# Patient Record
Sex: Female | Born: 1940 | Race: White | Hispanic: No | Marital: Married | Smoking: Never smoker
Health system: Southern US, Community
[De-identification: ages and names within clinical notes are randomized; demographics above are authoritative.]

## PROBLEM LIST (undated history)

## (undated) DIAGNOSIS — T8859XA Other complications of anesthesia, initial encounter: Secondary | ICD-10-CM

## (undated) DIAGNOSIS — I340 Nonrheumatic mitral (valve) insufficiency: Secondary | ICD-10-CM

## (undated) DIAGNOSIS — M1990 Unspecified osteoarthritis, unspecified site: Secondary | ICD-10-CM

## (undated) DIAGNOSIS — G4730 Sleep apnea, unspecified: Secondary | ICD-10-CM

## (undated) DIAGNOSIS — E039 Hypothyroidism, unspecified: Secondary | ICD-10-CM

## (undated) DIAGNOSIS — R0600 Dyspnea, unspecified: Secondary | ICD-10-CM

## (undated) DIAGNOSIS — T4145XA Adverse effect of unspecified anesthetic, initial encounter: Secondary | ICD-10-CM

## (undated) DIAGNOSIS — Z9889 Other specified postprocedural states: Secondary | ICD-10-CM

## (undated) DIAGNOSIS — R112 Nausea with vomiting, unspecified: Secondary | ICD-10-CM

## (undated) DIAGNOSIS — N393 Stress incontinence (female) (male): Secondary | ICD-10-CM

## (undated) DIAGNOSIS — I10 Essential (primary) hypertension: Secondary | ICD-10-CM

## (undated) DIAGNOSIS — Z8489 Family history of other specified conditions: Secondary | ICD-10-CM

## (undated) HISTORY — PX: BREAST EXCISIONAL BIOPSY: SUR124

## (undated) HISTORY — PX: DILATION AND CURETTAGE OF UTERUS: SHX78

## (undated) HISTORY — PX: CATARACT EXTRACTION W/ INTRAOCULAR LENS  IMPLANT, BILATERAL: SHX1307

## (undated) HISTORY — PX: BREAST CYST ASPIRATION: SHX578

## (undated) HISTORY — PX: KNEE ARTHROSCOPY: SHX127

## (undated) HISTORY — PX: COLONOSCOPY: SHX174

---

## 2004-05-08 ENCOUNTER — Ambulatory Visit: Payer: Self-pay | Admitting: Internal Medicine

## 2004-10-21 ENCOUNTER — Other Ambulatory Visit: Payer: Self-pay

## 2004-10-31 ENCOUNTER — Ambulatory Visit: Payer: Self-pay | Admitting: Otolaryngology

## 2005-04-19 ENCOUNTER — Ambulatory Visit: Payer: Self-pay

## 2005-05-23 ENCOUNTER — Ambulatory Visit: Payer: Self-pay | Admitting: Internal Medicine

## 2005-05-27 ENCOUNTER — Ambulatory Visit: Payer: Self-pay | Admitting: General Practice

## 2005-10-07 ENCOUNTER — Ambulatory Visit: Payer: Self-pay | Admitting: Gastroenterology

## 2006-12-24 ENCOUNTER — Ambulatory Visit: Payer: Self-pay | Admitting: Unknown Physician Specialty

## 2007-09-08 ENCOUNTER — Ambulatory Visit: Payer: Self-pay | Admitting: Podiatry

## 2008-03-15 ENCOUNTER — Ambulatory Visit: Payer: Self-pay | Admitting: Unknown Physician Specialty

## 2008-03-23 ENCOUNTER — Ambulatory Visit: Payer: Self-pay | Admitting: Unknown Physician Specialty

## 2008-09-15 ENCOUNTER — Ambulatory Visit: Payer: Self-pay | Admitting: Surgery

## 2009-02-13 ENCOUNTER — Ambulatory Visit: Payer: Self-pay | Admitting: Unknown Physician Specialty

## 2009-03-16 ENCOUNTER — Ambulatory Visit: Payer: Self-pay | Admitting: Unknown Physician Specialty

## 2010-05-10 ENCOUNTER — Ambulatory Visit: Payer: Self-pay | Admitting: Unknown Physician Specialty

## 2010-05-28 ENCOUNTER — Ambulatory Visit: Payer: Self-pay | Admitting: Internal Medicine

## 2010-06-06 ENCOUNTER — Ambulatory Visit: Payer: Self-pay | Admitting: Internal Medicine

## 2012-01-20 ENCOUNTER — Ambulatory Visit: Payer: Self-pay | Admitting: Obstetrics & Gynecology

## 2013-01-24 ENCOUNTER — Ambulatory Visit: Payer: Self-pay | Admitting: Internal Medicine

## 2014-01-25 ENCOUNTER — Ambulatory Visit: Payer: Self-pay | Admitting: Internal Medicine

## 2014-01-30 DIAGNOSIS — G4733 Obstructive sleep apnea (adult) (pediatric): Secondary | ICD-10-CM | POA: Insufficient documentation

## 2014-01-30 DIAGNOSIS — Z9989 Dependence on other enabling machines and devices: Secondary | ICD-10-CM | POA: Insufficient documentation

## 2014-02-17 ENCOUNTER — Ambulatory Visit: Payer: Self-pay | Admitting: Unknown Physician Specialty

## 2014-03-28 ENCOUNTER — Ambulatory Visit: Payer: Self-pay | Admitting: Ophthalmology

## 2014-03-28 DIAGNOSIS — I10 Essential (primary) hypertension: Secondary | ICD-10-CM

## 2014-03-28 LAB — POTASSIUM: Potassium: 3.9 mmol/L (ref 3.5–5.1)

## 2014-04-04 ENCOUNTER — Ambulatory Visit: Payer: Self-pay | Admitting: Ophthalmology

## 2014-05-01 ENCOUNTER — Ambulatory Visit: Payer: Self-pay | Admitting: Ophthalmology

## 2014-05-30 ENCOUNTER — Ambulatory Visit: Payer: Self-pay | Admitting: Ophthalmology

## 2014-05-30 LAB — POTASSIUM: Potassium: 3.8 mmol/L

## 2014-06-06 ENCOUNTER — Ambulatory Visit
Admit: 2014-06-06 | Disposition: A | Discharge status: Home / Self Care | Payer: Self-pay | Attending: Ophthalmology | Admitting: Ophthalmology

## 2014-06-26 LAB — SURGICAL PATHOLOGY

## 2014-07-02 NOTE — Op Note (Signed)
PATIENT NAME:  Hannah Tucker, Hannah Tucker MR#:  [id number] DATE OF BIRTH:  1940/03/08  DATE OF PROCEDURE:  04/04/2014  PREOPERATIVE DIAGNOSIS:  Nuclear sclerotic cataract of the left eye.   POSTOPERATIVE DIAGNOSIS:  Nuclear sclerotic cataract of the left eye.   OPERATIVE PROCEDURE:  Cataract extraction by phacoemulsification with implant of intraocular lens to left eye.   SURGEON:  Galen ManilaWilliam Dymir Neeson, MD.   ANESTHESIA:  1. Managed anesthesia care.  2. Topical tetracaine drops followed by 2% Xylocaine jelly applied in the preoperative holding area.   COMPLICATIONS:  None.   TECHNIQUE:   Stop and chop.   DESCRIPTION OF PROCEDURE:  The patient was examined and consented in the preoperative holding area where the aforementioned topical anesthesia was applied to the left eye and then brought back to the Operating Room where the left eye was prepped and draped in the usual sterile ophthalmic fashion and a lid speculum was placed. A paracentesis was created with the side port blade and the anterior chamber was filled with viscoelastic. A near clear corneal incision was performed with the steel keratome. A continuous curvilinear capsulorrhexis was performed with a cystotome followed by the capsulorrhexis forceps. Hydrodissection and hydrodelineation were carried out with BSS on a blunt cannula. The lens was removed in a stop and chop technique and the remaining cortical material was removed with the irrigation-aspiration handpiece. The capsular bag was inflated with viscoelastic and the Tecnis ZCB00 22.0-diopter lens, serial number [id number], was placed in the capsular bag without complication. The remaining viscoelastic was removed from the eye with the irrigation-aspiration handpiece. The wounds were hydrated. The anterior chamber was flushed with Miostat and the eye was inflated to physiologic pressure. 0.1 mL of cefuroxime concentration 10 mg/mL was placed in the anterior chamber. The wounds were found to be  water tight. The eye was dressed with Vigamox. The patient was given protective glasses to wear throughout the day and a shield with which to sleep tonight. The patient was also given drops with which to begin a drop regimen today and will follow-up with me in one day.    ____________________________ Jerilee FieldWilliam L. Antoinne Spadaccini, MD wlp:bu D: 04/04/2014 13:10:00 ET T: 04/04/2014 13:44:26 ET JOB#: [id number]  cc: Janeya Deyo L. Kejuan Bekker, MD, <Dictator> Jerilee FieldWILLIAM L Kristyna Bradstreet MD ELECTRONICALLY SIGNED 04/05/2014 13:56

## 2014-07-02 NOTE — Op Note (Signed)
PATIENT NAME:  Hannah Tucker, Lititia J MR#:  [id number] DATE OF BIRTH:  March 18, 1940  DATE OF PROCEDURE:  06/06/2014  PREOPERATIVE DIAGNOSIS:  Nuclear sclerotic cataract of the right eye.   POSTOPERATIVE DIAGNOSIS:  Nuclear sclerotic cataract of the right eye.   OPERATIVE PROCEDURE:  Cataract extraction by phacoemulsification with implant of intraocular lens to right eye.   SURGEON:  Galen ManilaWilliam Brylan Dec, MD.   ANESTHESIA:  1. Managed anesthesia care.  2. Topical tetracaine drops followed by 2% Xylocaine jelly applied in the preoperative holding area.   COMPLICATIONS:  None.   TECHNIQUE:   Stop and chop.  DESCRIPTION OF PROCEDURE:  The patient was examined and consented in the preoperative holding area where the aforementioned topical anesthesia was applied to the right eye and then brought back to the Operating Room where the right eye was prepped and draped in the usual sterile ophthalmic fashion and a lid speculum was placed. A paracentesis was created with the side port blade and the anterior chamber was filled with viscoelastic. A near clear corneal incision was performed with the steel keratome. A continuous curvilinear capsulorrhexis was performed with a cystotome followed by the capsulorrhexis forceps. Hydrodissection and hydrodelineation were carried out with BSS on a blunt cannula. The lens was removed in a stop and chop technique and the remaining cortical material was removed with the irrigation-aspiration handpiece. The capsular bag was inflated with viscoelastic and the Tecnis ZCB00 21.5-diopter lens, serial number #[id number] was placed in the capsular bag without complication. The remaining viscoelastic was removed from the eye with the irrigation-aspiration handpiece. The wounds were hydrated. The anterior chamber was flushed with Miostat and the eye was inflated to physiologic pressure. 0.1 mL of cefuroxime concentration 10 mg/mL was placed in the anterior chamber. The wounds were found to be  water tight. The eye was dressed with Vigamox. The patient was given protective glasses to wear throughout the day and a shield with which to sleep tonight. The patient was also given drops with which to begin a drop regimen today and will follow-up with me in one day.     ____________________________ Jerilee FieldWilliam L. Uchenna Seufert, MD wlp:at D: 06/06/2014 12:34:02 ET T: 06/06/2014 13:10:20 ET JOB#: [id number]  cc: Letetia Romanello L. Abbeygail Igoe, MD, <Dictator> Jerilee FieldWILLIAM L Malky Rudzinski MD ELECTRONICALLY SIGNED 06/07/2014 12:34

## 2015-03-01 DIAGNOSIS — M818 Other osteoporosis without current pathological fracture: Secondary | ICD-10-CM | POA: Insufficient documentation

## 2015-03-14 ENCOUNTER — Other Ambulatory Visit: Payer: Self-pay | Admitting: Internal Medicine

## 2015-03-14 DIAGNOSIS — Z1231 Encounter for screening mammogram for malignant neoplasm of breast: Secondary | ICD-10-CM

## 2015-03-22 ENCOUNTER — Ambulatory Visit
Admission: RE | Admit: 2015-03-22 | Discharge: 2015-03-22 | Disposition: A | Discharge status: Home / Self Care | Payer: Medicare Other | Source: Ambulatory Visit | Attending: Internal Medicine | Admitting: Internal Medicine

## 2015-03-22 ENCOUNTER — Other Ambulatory Visit: Payer: Self-pay | Admitting: Internal Medicine

## 2015-03-22 DIAGNOSIS — Z1231 Encounter for screening mammogram for malignant neoplasm of breast: Secondary | ICD-10-CM | POA: Insufficient documentation

## 2015-06-13 ENCOUNTER — Other Ambulatory Visit: Payer: Self-pay | Admitting: Physician Assistant

## 2015-06-13 DIAGNOSIS — M2392 Unspecified internal derangement of left knee: Secondary | ICD-10-CM

## 2015-06-14 ENCOUNTER — Other Ambulatory Visit: Payer: Self-pay | Admitting: Orthopedic Surgery

## 2015-06-14 DIAGNOSIS — M25562 Pain in left knee: Secondary | ICD-10-CM

## 2015-06-14 DIAGNOSIS — M2392 Unspecified internal derangement of left knee: Secondary | ICD-10-CM

## 2015-07-04 ENCOUNTER — Ambulatory Visit
Admission: RE | Admit: 2015-07-04 | Discharge: 2015-07-04 | Disposition: A | Discharge status: Home / Self Care | Payer: Medicare Other | Source: Ambulatory Visit | Attending: Orthopedic Surgery | Admitting: Orthopedic Surgery

## 2015-07-04 DIAGNOSIS — M949 Disorder of cartilage, unspecified: Secondary | ICD-10-CM | POA: Insufficient documentation

## 2015-07-04 DIAGNOSIS — S83242A Other tear of medial meniscus, current injury, left knee, initial encounter: Secondary | ICD-10-CM | POA: Insufficient documentation

## 2015-07-04 DIAGNOSIS — M25562 Pain in left knee: Secondary | ICD-10-CM

## 2015-07-04 DIAGNOSIS — S83512A Sprain of anterior cruciate ligament of left knee, initial encounter: Secondary | ICD-10-CM | POA: Insufficient documentation

## 2015-07-04 DIAGNOSIS — M25662 Stiffness of left knee, not elsewhere classified: Secondary | ICD-10-CM | POA: Diagnosis present

## 2015-07-16 ENCOUNTER — Encounter
Admission: RE | Admit: 2015-07-16 | Discharge: 2015-07-16 | Disposition: A | Discharge status: Home / Self Care | Payer: Medicare Other | Source: Ambulatory Visit | Attending: Orthopedic Surgery | Admitting: Orthopedic Surgery

## 2015-07-16 DIAGNOSIS — Z8249 Family history of ischemic heart disease and other diseases of the circulatory system: Secondary | ICD-10-CM | POA: Diagnosis not present

## 2015-07-16 DIAGNOSIS — Z823 Family history of stroke: Secondary | ICD-10-CM | POA: Diagnosis not present

## 2015-07-16 DIAGNOSIS — Z86010 Personal history of colonic polyps: Secondary | ICD-10-CM | POA: Diagnosis not present

## 2015-07-16 DIAGNOSIS — Z833 Family history of diabetes mellitus: Secondary | ICD-10-CM | POA: Diagnosis not present

## 2015-07-16 DIAGNOSIS — M23252 Derangement of posterior horn of lateral meniscus due to old tear or injury, left knee: Secondary | ICD-10-CM | POA: Diagnosis not present

## 2015-07-16 DIAGNOSIS — M2392 Unspecified internal derangement of left knee: Secondary | ICD-10-CM | POA: Diagnosis present

## 2015-07-16 DIAGNOSIS — Z8261 Family history of arthritis: Secondary | ICD-10-CM | POA: Diagnosis not present

## 2015-07-16 DIAGNOSIS — M810 Age-related osteoporosis without current pathological fracture: Secondary | ICD-10-CM | POA: Diagnosis not present

## 2015-07-16 DIAGNOSIS — M94262 Chondromalacia, left knee: Secondary | ICD-10-CM | POA: Diagnosis not present

## 2015-07-16 DIAGNOSIS — G4733 Obstructive sleep apnea (adult) (pediatric): Secondary | ICD-10-CM | POA: Diagnosis not present

## 2015-07-16 DIAGNOSIS — E039 Hypothyroidism, unspecified: Secondary | ICD-10-CM | POA: Diagnosis not present

## 2015-07-16 DIAGNOSIS — Z881 Allergy status to other antibiotic agents status: Secondary | ICD-10-CM | POA: Diagnosis not present

## 2015-07-16 DIAGNOSIS — I10 Essential (primary) hypertension: Secondary | ICD-10-CM | POA: Diagnosis not present

## 2015-07-16 DIAGNOSIS — Z79899 Other long term (current) drug therapy: Secondary | ICD-10-CM | POA: Diagnosis not present

## 2015-07-16 DIAGNOSIS — M23222 Derangement of posterior horn of medial meniscus due to old tear or injury, left knee: Secondary | ICD-10-CM | POA: Diagnosis not present

## 2015-07-16 DIAGNOSIS — Z800 Family history of malignant neoplasm of digestive organs: Secondary | ICD-10-CM | POA: Diagnosis not present

## 2015-07-16 DIAGNOSIS — E785 Hyperlipidemia, unspecified: Secondary | ICD-10-CM | POA: Diagnosis not present

## 2015-07-16 DIAGNOSIS — I341 Nonrheumatic mitral (valve) prolapse: Secondary | ICD-10-CM | POA: Diagnosis not present

## 2015-07-16 DIAGNOSIS — M23242 Derangement of anterior horn of lateral meniscus due to old tear or injury, left knee: Secondary | ICD-10-CM | POA: Diagnosis not present

## 2015-07-16 HISTORY — DX: Essential (primary) hypertension: I10

## 2015-07-16 HISTORY — DX: Stress incontinence (female) (male): N39.3

## 2015-07-16 HISTORY — DX: Adverse effect of unspecified anesthetic, initial encounter: T41.45XA

## 2015-07-16 HISTORY — DX: Hypothyroidism, unspecified: E03.9

## 2015-07-16 HISTORY — DX: Unspecified osteoarthritis, unspecified site: M19.90

## 2015-07-16 HISTORY — DX: Other complications of anesthesia, initial encounter: T88.59XA

## 2015-07-16 LAB — POTASSIUM: POTASSIUM: 3.3 mmol/L — AB (ref 3.5–5.1)

## 2015-07-16 NOTE — Pre-Procedure Instructions (Signed)
[facility] and [facility]

## 2015-07-16 NOTE — Pre-Procedure Instructions (Signed)
"  Dr Ernest PineHooten in OR will place orders later today.  He is not requesting any labs."  Per Campbell Soupiffany

## 2015-07-16 NOTE — Patient Instructions (Signed)
  Your procedure is scheduled on: 07/18/15 Wed Report to Same Day Surgery 2nd floor medical mall To find out your arrival time please call [phone] between 1PM - 3PM on 07/17/15 Tues  Remember: Instructions that are not followed completely may result in serious medical risk, up to and including death, or upon the discretion of your surgeon and anesthesiologist your surgery may need to be rescheduled.    _x___ 1. Do not eat food or drink liquids after midnight. No gum chewing or hard candies.     ____ 2. No Alcohol for 24 hours before or after surgery.   ____ 3. Bring all medications with you on the day of surgery if instructed.    __x__ 4. Notify your doctor if there is any change in your medical condition     (cold, fever, infections).     Do not wear jewelry, make-up, hairpins, clips or nail polish.  Do not wear lotions, powders, or perfumes. You may wear deodorant.  Do not shave 48 hours prior to surgery. Men may shave face and neck.  Do not bring valuables to the hospital.    [facility] any belongings or valuables.               Contacts, dentures or bridgework may not be worn into surgery.  Leave your suitcase in the car. After surgery it may be brought to your room.  For patients admitted to the hospital, discharge time is determined by your treatment team.   Patients discharged the day of surgery will not be allowed to drive home.    Please read over the following fact sheets that you were given:   [facility] Pre[facility] and or MRSA Information   _x___ Take these medicines the morning of surgery with A SIP OF WATER:    1. levothyroxine (SYNTHROID, LEVOTHROID) 88 MCG tablet  2.  3.  4.  5.  6.  ____ Fleet Enema (as directed)   __x__ Use CHG Soap or sage wipes as directed on instruction sheet   ____ Use inhalers on the day of surgery and bring to hospital day of surgery  ____ Stop metformin 2 days prior to surgery    ____ Take  1/2 of usual insulin dose the night before surgery and none on the morning of           surgery.   ____ Stop aspirin or coumadin, or plavix  _x__ Stop Anti-inflammatories such as Advil, Aleve, Ibuprofen, Motrin, Naproxen,          Naprosyn, Goodies powders or aspirin products. Ok to take Tylenol.   ____ Stop supplements until after surgery.    ____ Bring C-Pap to the hospital.

## 2015-07-16 NOTE — Pre-Procedure Instructions (Signed)
Called for orders, left message.

## 2015-07-17 NOTE — Pre-Procedure Instructions (Signed)
KT 3.3 CALLED AND FAXED TO DR HOOTEN. ALSO REQUESTED ORDERS

## 2015-07-18 ENCOUNTER — Ambulatory Visit: Payer: Medicare Other | Admitting: Certified Registered"

## 2015-07-18 ENCOUNTER — Encounter: Payer: Self-pay | Admitting: Orthopedic Surgery

## 2015-07-18 ENCOUNTER — Ambulatory Visit
Admission: RE | Admit: 2015-07-18 | Discharge: 2015-07-18 | Disposition: A | Discharge status: Home / Self Care | Payer: Medicare Other | Source: Ambulatory Visit | Attending: Orthopedic Surgery | Admitting: Orthopedic Surgery

## 2015-07-18 ENCOUNTER — Encounter
Admission: RE | Disposition: A | Discharge status: Home / Self Care | Payer: Self-pay | Source: Ambulatory Visit | Attending: Orthopedic Surgery

## 2015-07-18 DIAGNOSIS — I10 Essential (primary) hypertension: Secondary | ICD-10-CM | POA: Insufficient documentation

## 2015-07-18 DIAGNOSIS — Z823 Family history of stroke: Secondary | ICD-10-CM | POA: Insufficient documentation

## 2015-07-18 DIAGNOSIS — M810 Age-related osteoporosis without current pathological fracture: Secondary | ICD-10-CM | POA: Insufficient documentation

## 2015-07-18 DIAGNOSIS — M94262 Chondromalacia, left knee: Secondary | ICD-10-CM | POA: Insufficient documentation

## 2015-07-18 DIAGNOSIS — Z800 Family history of malignant neoplasm of digestive organs: Secondary | ICD-10-CM | POA: Insufficient documentation

## 2015-07-18 DIAGNOSIS — Z833 Family history of diabetes mellitus: Secondary | ICD-10-CM | POA: Insufficient documentation

## 2015-07-18 DIAGNOSIS — M23222 Derangement of posterior horn of medial meniscus due to old tear or injury, left knee: Secondary | ICD-10-CM | POA: Insufficient documentation

## 2015-07-18 DIAGNOSIS — E785 Hyperlipidemia, unspecified: Secondary | ICD-10-CM | POA: Insufficient documentation

## 2015-07-18 DIAGNOSIS — Z79899 Other long term (current) drug therapy: Secondary | ICD-10-CM | POA: Insufficient documentation

## 2015-07-18 DIAGNOSIS — M23242 Derangement of anterior horn of lateral meniscus due to old tear or injury, left knee: Secondary | ICD-10-CM | POA: Insufficient documentation

## 2015-07-18 DIAGNOSIS — M23252 Derangement of posterior horn of lateral meniscus due to old tear or injury, left knee: Secondary | ICD-10-CM | POA: Insufficient documentation

## 2015-07-18 DIAGNOSIS — Z8249 Family history of ischemic heart disease and other diseases of the circulatory system: Secondary | ICD-10-CM | POA: Insufficient documentation

## 2015-07-18 DIAGNOSIS — Z86010 Personal history of colonic polyps: Secondary | ICD-10-CM | POA: Insufficient documentation

## 2015-07-18 DIAGNOSIS — E039 Hypothyroidism, unspecified: Secondary | ICD-10-CM | POA: Insufficient documentation

## 2015-07-18 DIAGNOSIS — I341 Nonrheumatic mitral (valve) prolapse: Secondary | ICD-10-CM | POA: Insufficient documentation

## 2015-07-18 DIAGNOSIS — G4733 Obstructive sleep apnea (adult) (pediatric): Secondary | ICD-10-CM | POA: Insufficient documentation

## 2015-07-18 DIAGNOSIS — Z8261 Family history of arthritis: Secondary | ICD-10-CM | POA: Insufficient documentation

## 2015-07-18 DIAGNOSIS — Z881 Allergy status to other antibiotic agents status: Secondary | ICD-10-CM | POA: Insufficient documentation

## 2015-07-18 HISTORY — PX: KNEE ARTHROSCOPY WITH LATERAL MENISECTOMY: SHX6193

## 2015-07-18 HISTORY — PX: KNEE ARTHROSCOPY: SHX127

## 2015-07-18 LAB — POCT I-STAT 4, (NA,K, GLUC, HGB,HCT)
Glucose, Bld: 87 mg/dL (ref 65–99)
HCT: 44 % (ref 36.0–46.0)
HEMOGLOBIN: 15 g/dL (ref 12.0–15.0)
Potassium: 3.5 mmol/L (ref 3.5–5.1)
Sodium: 141 mmol/L (ref 135–145)

## 2015-07-18 SURGERY — ARTHROSCOPY, KNEE
Anesthesia: General | Site: Knee | Laterality: Left | Wound class: Clean

## 2015-07-18 MED ORDER — METOCLOPRAMIDE HCL 5 MG/ML IJ SOLN
5.0000 mg | Freq: Once | INTRAMUSCULAR | Status: AC
  Administered 2015-07-18: 5 mg via INTRAVENOUS

## 2015-07-18 MED ORDER — MORPHINE SULFATE (PF) 4 MG/ML IV SOLN
INTRAVENOUS | Status: AC
  Filled 2015-07-18: qty 1

## 2015-07-18 MED ORDER — FENTANYL CITRATE (PF) 100 MCG/2ML IJ SOLN
25.0000 ug | INTRAMUSCULAR | Status: DC | PRN
  Administered 2015-07-18 (×3): 25 ug via INTRAVENOUS

## 2015-07-18 MED ORDER — MORPHINE SULFATE (PF) 4 MG/ML IV SOLN
INTRAVENOUS | Status: DC | PRN
  Administered 2015-07-18: 3 mg

## 2015-07-18 MED ORDER — ONDANSETRON HCL 4 MG/2ML IJ SOLN
INTRAMUSCULAR | Status: DC
Start: 2015-07-18 — End: 2015-07-19
  Filled 2015-07-18: qty 2

## 2015-07-18 MED ORDER — ONDANSETRON HCL 4 MG/2ML IJ SOLN
4.0000 mg | Freq: Once | INTRAMUSCULAR | Status: AC | PRN
  Administered 2015-07-18: 4 mg via INTRAVENOUS

## 2015-07-18 MED ORDER — BUPIVACAINE-EPINEPHRINE (PF) 0.25% -1:200000 IJ SOLN
INTRAMUSCULAR | Status: DC | PRN
  Administered 2015-07-18: 30 mL via PERINEURAL

## 2015-07-18 MED ORDER — FENTANYL CITRATE (PF) 100 MCG/2ML IJ SOLN
INTRAMUSCULAR | Status: DC | PRN
  Administered 2015-07-18: 25 ug via INTRAVENOUS
  Administered 2015-07-18 (×2): 50 ug via INTRAVENOUS
  Administered 2015-07-18: 25 ug via INTRAVENOUS

## 2015-07-18 MED ORDER — BUPIVACAINE-EPINEPHRINE (PF) 0.25% -1:200000 IJ SOLN
INTRAMUSCULAR | Status: AC
  Filled 2015-07-18: qty 30

## 2015-07-18 MED ORDER — SCOPOLAMINE 1 MG/3DAYS TD PT72
MEDICATED_PATCH | TRANSDERMAL | Status: AC
  Filled 2015-07-18: qty 1

## 2015-07-18 MED ORDER — FAMOTIDINE 20 MG PO TABS
20.0000 mg | ORAL_TABLET | Freq: Once | ORAL | Status: AC
  Administered 2015-07-18: 20 mg via ORAL

## 2015-07-18 MED ORDER — LACTATED RINGERS IV SOLN
INTRAVENOUS | Status: DC
  Administered 2015-07-18: 18:00:00 via INTRAVENOUS

## 2015-07-18 MED ORDER — FAMOTIDINE 20 MG PO TABS
ORAL_TABLET | ORAL | Status: AC
Start: 2015-07-18 — End: 2015-07-18
  Administered 2015-07-18: 20 mg
  Filled 2015-07-18: qty 1

## 2015-07-18 MED ORDER — ONDANSETRON HCL 4 MG/2ML IJ SOLN
INTRAMUSCULAR | Status: DC | PRN
  Administered 2015-07-18: 4 mg via INTRAVENOUS

## 2015-07-18 MED ORDER — GLYCOPYRROLATE 0.2 MG/ML IJ SOLN
INTRAMUSCULAR | Status: DC | PRN
  Administered 2015-07-18: 0.2 mg via INTRAVENOUS

## 2015-07-18 MED ORDER — LIDOCAINE HCL (PF) 2 % IJ SOLN
INTRAMUSCULAR | Status: DC | PRN
  Administered 2015-07-18: 50 mg

## 2015-07-18 MED ORDER — HYDROCODONE-ACETAMINOPHEN 5-325 MG PO TABS: 1.0000 | ORAL_TABLET | ORAL | Status: DC | PRN

## 2015-07-18 MED ORDER — PROPOFOL 10 MG/ML IV BOLUS
INTRAVENOUS | Status: DC | PRN
  Administered 2015-07-18: 120 mg via INTRAVENOUS

## 2015-07-18 MED ORDER — ACETAMINOPHEN 10 MG/ML IV SOLN
INTRAVENOUS | Status: AC
  Filled 2015-07-18: qty 100

## 2015-07-18 MED ORDER — ACETAMINOPHEN 10 MG/ML IV SOLN
INTRAVENOUS | Status: DC | PRN
  Administered 2015-07-18: 1000 mg via INTRAVENOUS

## 2015-07-18 MED ORDER — SCOPOLAMINE 1 MG/3DAYS TD PT72
1.0000 | MEDICATED_PATCH | TRANSDERMAL | Status: DC
  Administered 2015-07-18: 1.5 mg via TRANSDERMAL

## 2015-07-18 MED ORDER — METOCLOPRAMIDE HCL 5 MG/ML IJ SOLN
INTRAMUSCULAR | Status: AC
  Administered 2015-07-18: 5 mg via INTRAVENOUS
  Filled 2015-07-18: qty 2

## 2015-07-18 MED ORDER — FENTANYL CITRATE (PF) 100 MCG/2ML IJ SOLN
INTRAMUSCULAR | Status: AC
  Administered 2015-07-18: 25 ug via INTRAVENOUS
  Filled 2015-07-18: qty 2

## 2015-07-18 MED ORDER — MIDAZOLAM HCL 5 MG/5ML IJ SOLN
INTRAMUSCULAR | Status: DC | PRN
  Administered 2015-07-18: 1 mg via INTRAVENOUS

## 2015-07-18 MED ORDER — DEXAMETHASONE SODIUM PHOSPHATE 10 MG/ML IJ SOLN
INTRAMUSCULAR | Status: DC | PRN
  Administered 2015-07-18: 5 mg via INTRAVENOUS

## 2015-07-18 SURGICAL SUPPLY — 24 items
ADAPTER IRRIG TUBE 2 SPIKE SOL (ADAPTER) ×4 IMPLANT
BLADE SHAVER 4.5 DBL SERAT CV (CUTTER) ×2 IMPLANT
BNDG ESMARK 6X12 TAN STRL LF (GAUZE/BANDAGES/DRESSINGS) ×2 IMPLANT
CUFF TOURN 24 STER (MISCELLANEOUS) ×2 IMPLANT
CUFF TOURN 30 STER DUAL PORT (MISCELLANEOUS) ×2 IMPLANT
DRSG DERMACEA 8X12 NADH (GAUZE/BANDAGES/DRESSINGS) ×2 IMPLANT
DURAPREP 26ML APPLICATOR (WOUND CARE) ×4 IMPLANT
GAUZE SPONGE 4X4 12PLY STRL (GAUZE/BANDAGES/DRESSINGS) ×2 IMPLANT
GLOVE BIOGEL M STRL SZ7.5 (GLOVE) ×2 IMPLANT
GLOVE INDICATOR 8.0 STRL GRN (GLOVE) ×2 IMPLANT
GOWN STRL REUS W/ TWL LRG LVL3 (GOWN DISPOSABLE) ×2 IMPLANT
GOWN STRL REUS W/TWL LRG LVL3 (GOWN DISPOSABLE) ×2
IV LACTATED RINGER IRRG 3000ML (IV SOLUTION) ×8
IV LR IRRIG 3000ML ARTHROMATIC (IV SOLUTION) ×8 IMPLANT
KIT RM TURNOVER STRD PROC AR (KITS) ×2 IMPLANT
MANIFOLD NEPTUNE II (INSTRUMENTS) ×2 IMPLANT
PACK ARTHROSCOPY KNEE (MISCELLANEOUS) ×2 IMPLANT
SET TUBE SUCT SHAVER OUTFL 24K (TUBING) ×2 IMPLANT
SET TUBE TIP INTRA-ARTICULAR (MISCELLANEOUS) ×2 IMPLANT
SUT ETHILON 3-0 FS-10 30 BLK (SUTURE) ×2
SUTURE EHLN 3-0 FS-10 30 BLK (SUTURE) ×1 IMPLANT
TUBING ARTHRO INFLOW-ONLY STRL (TUBING) ×2 IMPLANT
WAND HAND CNTRL MULTIVAC 50 (MISCELLANEOUS) ×2 IMPLANT
WRAP KNEE W/COLD PACKS 25.5X14 (SOFTGOODS) ×2 IMPLANT

## 2015-07-18 NOTE — Anesthesia Procedure Notes (Signed)
Procedure Name: LMA Insertion Performed by: Teesha Ohm Pre-anesthesia Checklist: Patient identified, Patient being monitored, Timeout performed, Emergency Drugs available and Suction available Patient Re-evaluated:Patient Re-evaluated prior to inductionOxygen Delivery Method: Circle system utilized Preoxygenation: Pre-oxygenation with 100% oxygen Intubation Type: IV induction Ventilation: Mask ventilation without difficulty LMA: LMA inserted LMA Size: 3.5 Tube type: Oral Number of attempts: 1 Placement Confirmation: positive ETCO2 and breath sounds checked- equal and bilateral Tube secured with: Tape Dental Injury: Teeth and Oropharynx as per pre-operative assessment        

## 2015-07-18 NOTE — Brief Op Note (Signed)
07/18/2015  8:29 PM  PATIENT:  Hannah Tucker  75 y.o. female  PRE-OPERATIVE DIAGNOSIS:  INTERNAL DERRANGEMENT LEFT KNEE  POST-OPERATIVE DIAGNOSIS:  Tear of the Medial menicus, Grade 3 Chondromalacia medial compartmental, lateral menicus tear AND GRADE 4 CHONDROMALACIA LATERAL TIBIAL PLATEAU. 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   Delay start of Pharmacological VTE agent (>24hrs) due to surgical blood loss or risk of bleeding: not applicable

## 2015-07-18 NOTE — H&P (Signed)
The patient has been re-examined, and the chart reviewed, and there have been no interval changes to the documented history and physical.    The risks, benefits, and alternatives have been discussed at length. The patient expressed understanding of the risks benefits and agreed with plans for surgical intervention.  James P. Hooten, Jr. M.D.    

## 2015-07-18 NOTE — Transfer of Care (Signed)
Immediate Anesthesia Transfer of Care Note  Patient: Hannah Tucker  Procedure(s) Performed: Procedure(s): ARTHROSCOPY KNEE WITH MEDIAL COMPARTMENTAL MENICUS CHONDROPLASTY (Left) LEFT KNEE ARTHROSCOPY WITH PARTIAL LATERAL MENISECTOMY GRADE 4 CHONDROMYLASIA LATERAL TIBIAL PLATEAU (Left)  Patient Location: PACU  Anesthesia Type:General  Level of Consciousness: awake, alert , oriented and patient cooperative  Airway & Oxygen Therapy: Patient Spontanous Breathing and Patient connected to face mask oxygen  Post-op Assessment: Report given to RN and Post -op Vital signs reviewed and stable  Post vital signs: Reviewed and stable  Last Vitals:  Filed Vitals:   07/18/15 1710 07/18/15 2030  BP:  154/70  Pulse: 65 66  Temp: 36.6 C 36.2 C  Resp: 14 28    Last Pain: There were no vitals filed for this visit.       Complications: No apparent anesthesia complications

## 2015-07-18 NOTE — Op Note (Signed)
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   ASSISTANT: none  ANESTHESIA: general  ESTIMATED BLOOD LOSS: Minimal  FLUIDS REPLACED: 900 mL of crystalloid  TOURNIQUET TIME: Not used   DRAINS: none  IMPLANTS UTILIZED: None  INDICATIONS FOR SURGERY: Hannah Tucker is a 75 y.o. year old female who has been seen for complaints of left knee pain. MRI demonstrated findings consistent with meniscal pathology. After discussion of the risks and benefits of surgical intervention, the patient expressed understanding of the risks benefits and agree with plans for left knee arthroscopy.   PROCEDURE IN DETAIL: The patient was brought into the operating room and, after adequate general anesthesia was achieved, a tourniquet was applied to the left thigh and the leg was placed in the leg holder. All bony prominences were well padded. The patient's left knee was cleaned and prepped with alcohol and Duraprep and draped in the usual sterile fashion. A "timeout" was performed as per usual protocol. The anticipated portal sites were injected with 0.25% Marcaine with epinephrine. An anterolateral incision was made and a cannula was inserted. A small effusion was evacuated and the knee was distended with fluid using the pump. The scope was advanced down the medial gutter into the medial  compartment. Under visualization with the scope, an anteromedial portal was created and a hooked probe was inserted. The medial meniscus was visualized and probed. There was a degenerative tear involving the posterior horn of the medial meniscus. The tear was debrided using combination of meniscal punches and a 4.5 mm incisor shaver. Final contouring was performed using the 50 ArthroCare wand. The articular cartilage was visualized. Grade 3 chondromalacia was noted involving primarily the medial femoral condyle. These areas were debrided and contoured using the ArthroCare wand.  The scope was then advanced into the intercondylar notch. The anterior cruciate ligament was visualized and probed and felt to be intact. The scope was removed from the lateral portal and reinserted via the anteromedial portal to better visualize the lateral compartment. The lateral meniscus was visualized and probed. Degenerative, macerated tears of both the anterior and posterior horns of the lateral meniscus were noted. These areas were debrided using meniscal punches and a 4.5 mm shaver. Contouring was performed using the ArthroCare wand. The articular cartilage of the lateral compartment was visualized. Grade 4 chondromalacia was noted to the lateral tibial plateau. The area was debrided and contoured using the ArthroCare wand. Lesser changes are noted to the lateral femoral condyle. Finally, the scope was advanced so as to visualize the patellofemoral articulation. Good patellar tracking was appreciated. Grade 2-3 changes of chondromalacia were noted and were contoured using the ArthroCare wand.  The knee was irrigated with copius amounts of fluid and suctioned dry. The anterolateral portal was re-approximated with #3-0 nylon. A combination of 0.25% Marcaine with epinephrine and 4 mg of Morphine were injected via the scope. The scope  was removed and the anteromedial portal was re-approximated with #3-0 nylon. A sterile dressing  was applied followed by application of an ice wrap.  The patient tolerated the procedure well and was transported to the PACU in stable condition.  James P. Angie Fava., M.D.

## 2015-07-18 NOTE — Anesthesia Preprocedure Evaluation (Signed)
Anesthesia Evaluation  Patient identified by MRN, date of birth, ID band Patient awake    Reviewed: Allergy & Precautions, H&P , NPO status , Patient's Chart, lab work & pertinent test results, reviewed documented beta blocker date and time   History of Anesthesia Complications (+) PONV and history of anesthetic complications  Airway Mallampati: I  TM Distance: >3 FB Neck ROM: full    Dental no notable dental hx. (+) Teeth Intact, Caps   Pulmonary neg shortness of breath, sleep apnea and Continuous Positive Airway Pressure Ventilation , neg COPD, neg recent URI,    Pulmonary exam normal breath sounds clear to auscultation       Cardiovascular Exercise Tolerance: Good hypertension, On Medications (-) angina(-) CAD, (-) Past MI, (-) Cardiac Stents and (-) CABG Normal cardiovascular exam(-) dysrhythmias + Valvular Problems/Murmurs MVP  Rhythm:regular Rate:Normal     Neuro/Psych negative neurological ROS  negative psych ROS   GI/Hepatic negative GI ROS, Neg liver ROS,   Endo/Other  neg diabetesHypothyroidism   Renal/GU negative Renal ROS  negative genitourinary   Musculoskeletal   Abdominal   Peds  Hematology negative hematology ROS (+)   Anesthesia Other Findings Past Medical History:   Hypertension                                                 Hypothyroidism                                               Arthritis                                                    Stress incontinence                                          Complication of anesthesia                                     Comment:nausea   Reproductive/Obstetrics negative OB ROS                             Anesthesia Physical Anesthesia Plan  ASA: II  Anesthesia Plan: General   Post-op Pain Management:    Induction:   Airway Management Planned:   Additional Equipment:   Intra-op Plan:   Post-operative Plan:    Informed Consent: I have reviewed the patients History and Physical, chart, labs and discussed the procedure including the risks, benefits and alternatives for the proposed anesthesia with the patient or authorized representative who has indicated his/her understanding and acceptance.   Dental Advisory Given  Plan Discussed with: Anesthesiologist, CRNA and Surgeon  Anesthesia Plan Comments:         Anesthesia Quick Evaluation

## 2015-07-18 NOTE — Discharge Instructions (Signed)
°  Instructions after Knee Arthroscopy  ° ° James P. Hooten, Jr., M.D.    ° Dept. of Orthopaedics & Sports Medicine ° [facility] ° [address] ° [place], [place]  [address] ° ° Phone: [phone]   Fax: [phone] ° ° °DIET: °• Drink plenty of non-alcoholic fluids & begin a light diet. °• Resume your normal diet the day after surgery. ° °ACTIVITY:  °• You may use crutches or a walker with weight-bearing as tolerated, unless instructed otherwise. °• You may wean yourself off of the walker or crutches as tolerated.  °• Begin doing gentle exercises. Exercising will reduce the pain and swelling, increase motion, and prevent muscle weakness.   °• Avoid strenuous activities or athletics for a minimum of 4-6 weeks after arthroscopic surgery. °• Do not drive or operate any equipment until instructed. ° °WOUND CARE:  °• Place one to two pillows under the knee the first day or two when sitting or lying.  °• Continue to use the ice packs periodically to reduce pain and swelling. °• The small incisions in your knee are closed with nylon stitches. The stitches will be removed in the office. °• The bulky dressing may be removed on the second day after surgery. DO NOT TOUCH THE STITCHES. Put a Band-Aid over each stitch. Do NOT use any ointments or creams on the incisions.  °• You may bathe or shower after the stitches are removed at the first office visit following surgery. ° °MEDICATIONS: °• You may resume your regular medications. °• Please take the pain medication as prescribed. °• Do not take pain medication on an empty stomach. °• Do not drive or drink alcoholic beverages when taking pain medications. ° °CALL THE OFFICE FOR: °• Temperature above 101 degrees °• Excessive bleeding or drainage on the dressing. °• Excessive swelling, coldness, or paleness of the toes. °• Persistent nausea and vomiting. ° °FOLLOW-UP:  °You should have an appointment to return to the office in 7-10 days after surgery.  AMBULATORY  SURGERY  °DISCHARGE INSTRUCTIONS ° ° °The drugs that you were given will stay in your system until tomorrow so for the next 24 hours you should not: ° °Drive an automobile °Make any legal decisions °Drink any alcoholic beverage ° ° °You may resume regular meals tomorrow.  Today it is better to start with liquids and gradually work up to solid foods. ° °You may eat anything you prefer, but it is better to start with liquids, then soup and crackers, and gradually work up to solid foods. ° ° °Please notify your doctor immediately if you have any unusual bleeding, trouble breathing, redness and pain at the surgery site, drainage, fever, or pain not relieved by medication. ° ° ° °Additional Instructions: ° ° ° ° ° ° ° °• Please contact your physician with any problems or Same Day Surgery at [phone], Monday through Friday 6 am to 4 pm, or [place] [facility] [place] Main number at [phone]. °

## 2015-07-19 ENCOUNTER — Encounter: Payer: Self-pay | Admitting: Orthopedic Surgery

## 2015-07-19 NOTE — Anesthesia Postprocedure Evaluation (Signed)
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)  Patient location during evaluation: PACU Anesthesia Type: General Level of consciousness: awake and alert Pain management: pain level controlled Vital Signs Assessment: post-procedure vital signs reviewed and stable Respiratory status: spontaneous breathing, nonlabored ventilation, respiratory function stable and patient connected to nasal cannula oxygen Cardiovascular status: blood pressure returned to baseline and stable Postop Assessment: no signs of nausea or vomiting Anesthetic complications: no    Last Vitals:  Filed Vitals:   07/18/15 2141 07/18/15 2216  BP: 163/76 165/77  Pulse: 64 65  Temp:    Resp: 13 16    Last Pain:  Filed Vitals:   07/18/15 2217  PainSc: 2                  Lenard SimmerAndrew Rhyatt Muska

## 2016-04-10 ENCOUNTER — Other Ambulatory Visit: Payer: Self-pay | Admitting: Internal Medicine

## 2016-04-10 DIAGNOSIS — Z1231 Encounter for screening mammogram for malignant neoplasm of breast: Secondary | ICD-10-CM

## 2016-05-06 ENCOUNTER — Ambulatory Visit: Payer: Medicare Other

## 2016-06-02 ENCOUNTER — Ambulatory Visit
Admission: RE | Admit: 2016-06-02 | Discharge: 2016-06-02 | Disposition: A | Discharge status: Home / Self Care | Payer: Medicare Other | Source: Ambulatory Visit | Attending: Internal Medicine | Admitting: Internal Medicine

## 2016-06-02 DIAGNOSIS — Z1231 Encounter for screening mammogram for malignant neoplasm of breast: Secondary | ICD-10-CM

## 2016-08-13 ENCOUNTER — Other Ambulatory Visit: Payer: Medicare Other

## 2016-11-12 ENCOUNTER — Other Ambulatory Visit: Payer: Medicare Other

## 2016-11-17 ENCOUNTER — Encounter
Admission: RE | Admit: 2016-11-17 | Discharge: 2016-11-17 | Disposition: A | Discharge status: Home / Self Care | Payer: Medicare Other | Source: Ambulatory Visit | Attending: Orthopedic Surgery | Admitting: Orthopedic Surgery

## 2016-11-17 DIAGNOSIS — I10 Essential (primary) hypertension: Secondary | ICD-10-CM | POA: Diagnosis not present

## 2016-11-17 DIAGNOSIS — Z01810 Encounter for preprocedural cardiovascular examination: Secondary | ICD-10-CM | POA: Insufficient documentation

## 2016-11-17 DIAGNOSIS — Z01812 Encounter for preprocedural laboratory examination: Secondary | ICD-10-CM | POA: Diagnosis not present

## 2016-11-17 HISTORY — DX: Nausea with vomiting, unspecified: R11.2

## 2016-11-17 HISTORY — DX: Dyspnea, unspecified: R06.00

## 2016-11-17 HISTORY — DX: Family history of other specified conditions: Z84.89

## 2016-11-17 HISTORY — DX: Other specified postprocedural states: Z98.890

## 2016-11-17 LAB — URINALYSIS, ROUTINE W REFLEX MICROSCOPIC
BILIRUBIN URINE: NEGATIVE
GLUCOSE, UA: NEGATIVE mg/dL
HGB URINE DIPSTICK: NEGATIVE
Ketones, ur: NEGATIVE mg/dL
Leukocytes, UA: NEGATIVE
Nitrite: NEGATIVE
PH: 6 (ref 5.0–8.0)
Protein, ur: NEGATIVE mg/dL
SPECIFIC GRAVITY, URINE: 1.004 — AB (ref 1.005–1.030)

## 2016-11-17 LAB — TYPE AND SCREEN
ABO/RH(D): A NEG
ANTIBODY SCREEN: NEGATIVE

## 2016-11-17 LAB — COMPREHENSIVE METABOLIC PANEL
ALBUMIN: 4 g/dL (ref 3.5–5.0)
ALT: 15 U/L (ref 14–54)
ANION GAP: 8 (ref 5–15)
AST: 21 U/L (ref 15–41)
Alkaline Phosphatase: 37 U/L — ABNORMAL LOW (ref 38–126)
BILIRUBIN TOTAL: 0.4 mg/dL (ref 0.3–1.2)
BUN: 13 mg/dL (ref 6–20)
CO2: 28 mmol/L (ref 22–32)
Calcium: 9.6 mg/dL (ref 8.9–10.3)
Chloride: 103 mmol/L (ref 101–111)
Creatinine, Ser: 0.72 mg/dL (ref 0.44–1.00)
GFR calc non Af Amer: >60 mL/min (ref >60)
GLUCOSE: 96 mg/dL (ref 65–99)
POTASSIUM: 3.6 mmol/L (ref 3.5–5.1)
Sodium: 139 mmol/L (ref 135–145)
TOTAL PROTEIN: 7.6 g/dL (ref 6.5–8.1)

## 2016-11-17 LAB — CBC
HEMATOCRIT: 41.5 % (ref 35.0–47.0)
Hemoglobin: 14 g/dL (ref 12.0–16.0)
MCH: 27.6 pg (ref 26.0–34.0)
MCHC: 33.8 g/dL (ref 32.0–36.0)
MCV: 81.7 fL (ref 80.0–100.0)
Platelets: 265 10*3/uL (ref 150–440)
RBC: 5.08 MIL/uL (ref 3.80–5.20)
RDW: 14.3 % (ref 11.5–14.5)
WBC: 5.8 10*3/uL (ref 3.6–11.0)

## 2016-11-17 LAB — C-REACTIVE PROTEIN: CRP: <0.8 mg/dL (ref <1.0)

## 2016-11-17 LAB — PROTIME-INR
INR: 0.95
Prothrombin Time: 12.6 seconds (ref 11.4–15.2)

## 2016-11-17 LAB — APTT: APTT: 28 s (ref 24–36)

## 2016-11-17 LAB — SEDIMENTATION RATE: Sed Rate: 10 mm/hr (ref 0–30)

## 2016-11-17 LAB — SURGICAL PCR SCREEN
MRSA, PCR: NEGATIVE
Staphylococcus aureus: NEGATIVE

## 2016-11-17 NOTE — Patient Instructions (Signed)
  Your procedure is scheduled on: Mon. 11/24/16 Report to Day Surgery. To find out your arrival time please call [phone] between 1PM - 3PM on Friday .11/24/16  Remember: Instructions that are not followed completely may result in serious medical risk, up to and including death, or upon the discretion of your surgeon and anesthesiologist your surgery may need to be rescheduled.    ___x_ 1. Do not eat food after midnight the night before your procedure. No gum chewing or hard candies. You may drink clear liquids up to 2 hours before you are scheduled to arrive for your surgery- DO not drink clear liquids within 2 hours of the start of your surgery.  Clear Liquids include: water, apple juice without pulp, clear carbohydrate drink such as Clearfast of Gartorade, Black Coffee or Tea (Do not add anything to coffee or tea).    __x__ 2. No Alcohol for 24 hours before or after surgery.   ____ 3. Do Not Smoke For 24 Hours Prior to Your Surgery.   ____ 4. Bring all medications with you on the day of surgery if instructed.    _x___ 5. Notify your doctor if there is any change in your medical condition     (cold, fever, infections).       Do not wear jewelry, make-up, hairpins, clips or nail polish.  Do not wear lotions, powders, or perfumes. You may wear deodorant.  Do not shave 48 hours prior to surgery. Men may shave face and neck.  Do not bring valuables to the hospital.    [facility] is not responsible for any belongings or valuables.               Contacts, dentures or bridgework may not be worn into surgery.  Leave your suitcase in the car. After surgery it may be brought to your room.  For patients admitted to the hospital, discharge time is determined by your                treatment team.   Patients discharged the day of surgery will not be allowed to drive home.   Please read over the following fact sheets that you were given:   MRSA Information   _x___ Take  these medicines the morning of surgery with A SIP OF WATER:    1. levothyroxine (SYNTHROID, LEVOTHROID) 88 MCG tablet  2.   3.   4.  5.  6.  ____ Fleet Enema (as directed)   _x___ Use CHG Soap as directed  ____ Use inhalers on the day of surgery  ____ Stop metformin 2 days prior to surgery    ____ Take 1/2 of usual insulin dose the night before surgery and none on the morning of surgery.   ____ Stop Coumadin/Plavix/aspirin on   __x__ Stop Anti-inflammatories on naproxen sodium (ANAPROX) 220 MG tablet, Ibuprofen or Aspirin today   __x__ Stop supplements until after surgery.  Multiple Vitamins-Minerals (PRESERVISION AREDS 2+MULTI VIT PO), diclofenac sodium (VOLTAREN) 1 % GEL  __x__ Bring C-Pap to the hospital.

## 2016-11-18 LAB — URINE CULTURE
Culture: <10000 — AB
Special Requests: NORMAL

## 2016-11-23 MED ORDER — CEFAZOLIN SODIUM-DEXTROSE 2-4 GM/100ML-% IV SOLN: 2.0000 g | INTRAVENOUS | Status: DC

## 2016-11-23 MED ORDER — TRANEXAMIC ACID 1000 MG/10ML IV SOLN
1000.0000 mg | INTRAVENOUS | Status: DC
  Filled 2016-11-23: qty 10

## 2016-11-24 ENCOUNTER — Inpatient Hospital Stay
Admission: RE | Admit: 2016-11-24 | Discharge: 2016-11-26 | DRG: 470 | Disposition: A | Discharge status: Home Health | Payer: Medicare Other | Source: Ambulatory Visit | Attending: Orthopedic Surgery | Admitting: Orthopedic Surgery

## 2016-11-24 ENCOUNTER — Inpatient Hospital Stay: Payer: Medicare Other

## 2016-11-24 ENCOUNTER — Encounter
Admission: RE | Disposition: A | Discharge status: Home Health | Payer: Self-pay | Source: Ambulatory Visit | Attending: Orthopedic Surgery

## 2016-11-24 ENCOUNTER — Inpatient Hospital Stay: Payer: Medicare Other | Admitting: Anesthesiology

## 2016-11-24 ENCOUNTER — Encounter: Payer: Self-pay | Admitting: Orthopedic Surgery

## 2016-11-24 DIAGNOSIS — Z23 Encounter for immunization: Secondary | ICD-10-CM

## 2016-11-24 DIAGNOSIS — I10 Essential (primary) hypertension: Secondary | ICD-10-CM | POA: Diagnosis present

## 2016-11-24 DIAGNOSIS — Z96659 Presence of unspecified artificial knee joint: Secondary | ICD-10-CM

## 2016-11-24 DIAGNOSIS — Z6832 Body mass index (BMI) 32.0-32.9, adult: Secondary | ICD-10-CM | POA: Diagnosis not present

## 2016-11-24 DIAGNOSIS — G4733 Obstructive sleep apnea (adult) (pediatric): Secondary | ICD-10-CM | POA: Diagnosis present

## 2016-11-24 DIAGNOSIS — Z881 Allergy status to other antibiotic agents status: Secondary | ICD-10-CM

## 2016-11-24 DIAGNOSIS — E039 Hypothyroidism, unspecified: Secondary | ICD-10-CM | POA: Diagnosis present

## 2016-11-24 DIAGNOSIS — Z79899 Other long term (current) drug therapy: Secondary | ICD-10-CM

## 2016-11-24 DIAGNOSIS — E669 Obesity, unspecified: Secondary | ICD-10-CM | POA: Diagnosis present

## 2016-11-24 DIAGNOSIS — Z8249 Family history of ischemic heart disease and other diseases of the circulatory system: Secondary | ICD-10-CM | POA: Diagnosis not present

## 2016-11-24 DIAGNOSIS — N393 Stress incontinence (female) (male): Secondary | ICD-10-CM | POA: Diagnosis present

## 2016-11-24 DIAGNOSIS — Z96652 Presence of left artificial knee joint: Secondary | ICD-10-CM

## 2016-11-24 DIAGNOSIS — M1712 Unilateral primary osteoarthritis, left knee: Secondary | ICD-10-CM | POA: Diagnosis present

## 2016-11-24 HISTORY — PX: KNEE ARTHROPLASTY: SHX992

## 2016-11-24 LAB — ABO/RH: ABO/RH(D): A NEG

## 2016-11-24 SURGERY — ARTHROPLASTY, KNEE, TOTAL, USING IMAGELESS COMPUTER-ASSISTED NAVIGATION
Anesthesia: Spinal | Laterality: Left

## 2016-11-24 MED ORDER — NEOMYCIN-POLYMYXIN B GU 40-200000 IR SOLN
Status: DC | PRN
  Administered 2016-11-24: 14 mL

## 2016-11-24 MED ORDER — FLEET ENEMA 7-19 GM/118ML RE ENEM: 1.0000 | ENEMA | Freq: Once | RECTAL | Status: DC | PRN

## 2016-11-24 MED ORDER — CLOTRIMAZOLE 1 % EX CREA: 1.0000 "application " | TOPICAL_CREAM | Freq: Every day | CUTANEOUS | Status: DC | PRN

## 2016-11-24 MED ORDER — ONDANSETRON HCL 4 MG PO TABS
4.0000 mg | ORAL_TABLET | Freq: Four times a day (QID) | ORAL | Status: DC | PRN
  Administered 2016-11-24: 4 mg via ORAL
  Filled 2016-11-24: qty 1

## 2016-11-24 MED ORDER — ADULT MULTIVITAMIN W/MINERALS CH
1.0000 | ORAL_TABLET | Freq: Every day | ORAL | Status: DC
  Administered 2016-11-25 – 2016-11-26 (×2): 1 via ORAL
  Filled 2016-11-24: qty 1

## 2016-11-24 MED ORDER — CLONIDINE HCL 0.1 MG PO TABS
0.2000 mg | ORAL_TABLET | Freq: Once | ORAL | Status: AC
  Administered 2016-11-24: 0.2 mg via ORAL
  Filled 2016-11-24: qty 2

## 2016-11-24 MED ORDER — METOCLOPRAMIDE HCL 10 MG PO TABS
10.0000 mg | ORAL_TABLET | Freq: Three times a day (TID) | ORAL | Status: DC
  Administered 2016-11-24 – 2016-11-26 (×6): 10 mg via ORAL
  Filled 2016-11-24 (×6): qty 1

## 2016-11-24 MED ORDER — SCOPOLAMINE 1 MG/3DAYS TD PT72
MEDICATED_PATCH | TRANSDERMAL | Status: AC
  Administered 2016-11-24: 1.5 mg via TRANSDERMAL
  Filled 2016-11-24: qty 1

## 2016-11-24 MED ORDER — ONDANSETRON HCL 4 MG/2ML IJ SOLN
INTRAMUSCULAR | Status: AC
  Filled 2016-11-24: qty 2

## 2016-11-24 MED ORDER — CALCIUM CARBONATE-VITAMIN D 500-200 MG-UNIT PO TABS
1.0000 | ORAL_TABLET | Freq: Every day | ORAL | Status: DC
  Administered 2016-11-25 – 2016-11-26 (×2): 1 via ORAL
  Filled 2016-11-24 (×2): qty 1

## 2016-11-24 MED ORDER — OXYCODONE HCL 5 MG PO TABS
5.0000 mg | ORAL_TABLET | ORAL | Status: DC | PRN
  Administered 2016-11-24 (×2): 5 mg via ORAL
  Administered 2016-11-25 – 2016-11-26 (×5): 10 mg via ORAL
  Filled 2016-11-24 (×3): qty 2
  Filled 2016-11-24: qty 1
  Filled 2016-11-24 (×3): qty 2

## 2016-11-24 MED ORDER — TETRACAINE HCL 1 % IJ SOLN
INTRAMUSCULAR | Status: AC
  Filled 2016-11-24: qty 2

## 2016-11-24 MED ORDER — CEFAZOLIN SODIUM-DEXTROSE 2-4 GM/100ML-% IV SOLN
2.0000 g | Freq: Four times a day (QID) | INTRAVENOUS | Status: DC
  Administered 2016-11-24 – 2016-11-25 (×3): 2 g via INTRAVENOUS
  Filled 2016-11-24 (×6): qty 100

## 2016-11-24 MED ORDER — LOSARTAN POTASSIUM 50 MG PO TABS
100.0000 mg | ORAL_TABLET | Freq: Every day | ORAL | Status: DC
  Administered 2016-11-24 – 2016-11-26 (×3): 100 mg via ORAL
  Filled 2016-11-24 (×3): qty 2

## 2016-11-24 MED ORDER — PROPOFOL 500 MG/50ML IV EMUL
INTRAVENOUS | Status: AC
  Filled 2016-11-24: qty 50

## 2016-11-24 MED ORDER — FAMOTIDINE 20 MG PO TABS
20.0000 mg | ORAL_TABLET | Freq: Once | ORAL | Status: AC
  Administered 2016-11-24: 20 mg via ORAL

## 2016-11-24 MED ORDER — OXYCODONE HCL 5 MG PO TABS: 5.0000 mg | ORAL_TABLET | Freq: Once | ORAL | Status: DC | PRN

## 2016-11-24 MED ORDER — SODIUM CHLORIDE 0.9 % IV SOLN
INTRAVENOUS | Status: DC | PRN
  Administered 2016-11-24: 60 mL

## 2016-11-24 MED ORDER — PROPOFOL 500 MG/50ML IV EMUL
INTRAVENOUS | Status: AC
Start: 2016-11-24 — End: 2016-11-24
  Filled 2016-11-24: qty 50

## 2016-11-24 MED ORDER — PANTOPRAZOLE SODIUM 40 MG PO TBEC
40.0000 mg | DELAYED_RELEASE_TABLET | Freq: Two times a day (BID) | ORAL | Status: DC
  Administered 2016-11-24 – 2016-11-26 (×4): 40 mg via ORAL
  Filled 2016-11-24 (×4): qty 1

## 2016-11-24 MED ORDER — MEPERIDINE HCL 50 MG/ML IJ SOLN: 6.2500 mg | INTRAMUSCULAR | Status: DC | PRN

## 2016-11-24 MED ORDER — DEXAMETHASONE SODIUM PHOSPHATE 10 MG/ML IJ SOLN
INTRAMUSCULAR | Status: DC | PRN
  Administered 2016-11-24: 5 mg via INTRAVENOUS

## 2016-11-24 MED ORDER — BUPIVACAINE HCL (PF) 0.25 % IJ SOLN
INTRAMUSCULAR | Status: DC | PRN
  Administered 2016-11-24: 30 mL

## 2016-11-24 MED ORDER — FERROUS SULFATE 325 (65 FE) MG PO TABS
325.0000 mg | ORAL_TABLET | Freq: Two times a day (BID) | ORAL | Status: DC
  Administered 2016-11-25 – 2016-11-26 (×3): 325 mg via ORAL
  Filled 2016-11-24 (×3): qty 1

## 2016-11-24 MED ORDER — FAMOTIDINE 20 MG PO TABS
ORAL_TABLET | ORAL | Status: AC
  Administered 2016-11-24: 20 mg via ORAL
  Filled 2016-11-24: qty 1

## 2016-11-24 MED ORDER — ACETAMINOPHEN 10 MG/ML IV SOLN
INTRAVENOUS | Status: DC | PRN
  Administered 2016-11-24: 1000 mg via INTRAVENOUS

## 2016-11-24 MED ORDER — ENOXAPARIN SODIUM 30 MG/0.3ML ~~LOC~~ SOLN
30.0000 mg | Freq: Two times a day (BID) | SUBCUTANEOUS | Status: DC
  Administered 2016-11-25 – 2016-11-26 (×3): 30 mg via SUBCUTANEOUS
  Filled 2016-11-24 (×3): qty 0.3

## 2016-11-24 MED ORDER — CHLORHEXIDINE GLUCONATE 4 % EX LIQD: 60.0000 mL | Freq: Once | CUTANEOUS | Status: DC

## 2016-11-24 MED ORDER — BUPIVACAINE HCL (PF) 0.5 % IJ SOLN
INTRAMUSCULAR | Status: DC | PRN
  Administered 2016-11-24: 2 mL

## 2016-11-24 MED ORDER — FENTANYL CITRATE (PF) 100 MCG/2ML IJ SOLN
INTRAMUSCULAR | Status: DC | PRN
  Administered 2016-11-24 (×4): 25 ug via INTRAVENOUS

## 2016-11-24 MED ORDER — HYDROCHLOROTHIAZIDE 12.5 MG PO CAPS
12.5000 mg | ORAL_CAPSULE | Freq: Every day | ORAL | Status: DC
  Administered 2016-11-24 – 2016-11-26 (×3): 12.5 mg via ORAL
  Filled 2016-11-24 (×3): qty 1

## 2016-11-24 MED ORDER — PHENOL 1.4 % MT LIQD: 1.0000 | OROMUCOSAL | Status: DC | PRN

## 2016-11-24 MED ORDER — LEVOTHYROXINE SODIUM 88 MCG PO TABS
88.0000 ug | ORAL_TABLET | Freq: Every day | ORAL | Status: DC
  Administered 2016-11-25 – 2016-11-26 (×2): 88 ug via ORAL
  Filled 2016-11-24 (×2): qty 1

## 2016-11-24 MED ORDER — MORPHINE SULFATE (PF) 2 MG/ML IV SOLN: 2.0000 mg | INTRAVENOUS | Status: DC | PRN

## 2016-11-24 MED ORDER — TETRACAINE HCL 1 % IJ SOLN
INTRAMUSCULAR | Status: DC | PRN
  Administered 2016-11-24: 10 mg via INTRASPINAL

## 2016-11-24 MED ORDER — MAGNESIUM HYDROXIDE 400 MG/5ML PO SUSP
30.0000 mL | Freq: Every day | ORAL | Status: DC | PRN
  Administered 2016-11-24 – 2016-11-25 (×2): 30 mL via ORAL
  Filled 2016-11-24 (×2): qty 30

## 2016-11-24 MED ORDER — TRANEXAMIC ACID 1000 MG/10ML IV SOLN
1000.0000 mg | Freq: Once | INTRAVENOUS | Status: AC
  Administered 2016-11-24: 1000 mg via INTRAVENOUS
  Filled 2016-11-24: qty 10

## 2016-11-24 MED ORDER — TRANEXAMIC ACID 1000 MG/10ML IV SOLN
INTRAVENOUS | Status: DC | PRN
  Administered 2016-11-24: 1000 mg via INTRAVENOUS

## 2016-11-24 MED ORDER — POLYVINYL ALCOHOL 1.4 % OP SOLN
1.0000 [drp] | Freq: Every day | OPHTHALMIC | Status: DC
  Administered 2016-11-24 – 2016-11-25 (×2): 1 [drp] via OPHTHALMIC
  Filled 2016-11-24: qty 15

## 2016-11-24 MED ORDER — ONDANSETRON HCL 4 MG/2ML IJ SOLN: 4.0000 mg | Freq: Four times a day (QID) | INTRAMUSCULAR | Status: DC | PRN

## 2016-11-24 MED ORDER — FENTANYL CITRATE (PF) 100 MCG/2ML IJ SOLN
INTRAMUSCULAR | Status: AC
Start: 2016-11-24 — End: 2016-11-24
  Filled 2016-11-24: qty 2

## 2016-11-24 MED ORDER — ACETAMINOPHEN 325 MG PO TABS: 650.0000 mg | ORAL_TABLET | Freq: Four times a day (QID) | ORAL | Status: DC | PRN

## 2016-11-24 MED ORDER — ACETAMINOPHEN 650 MG RE SUPP: 650.0000 mg | Freq: Four times a day (QID) | RECTAL | Status: DC | PRN

## 2016-11-24 MED ORDER — SODIUM CHLORIDE 0.9 % IV SOLN
INTRAVENOUS | Status: DC
  Administered 2016-11-24: 19:00:00 via INTRAVENOUS

## 2016-11-24 MED ORDER — CEFAZOLIN SODIUM-DEXTROSE 2-3 GM-% IV SOLR
INTRAVENOUS | Status: DC | PRN
  Administered 2016-11-24: 2 g via INTRAVENOUS

## 2016-11-24 MED ORDER — SODIUM CHLORIDE 0.9 % IV SOLN
INTRAVENOUS | Status: DC | PRN
  Administered 2016-11-24: 20 ug/min via INTRAVENOUS

## 2016-11-24 MED ORDER — SCOPOLAMINE 1 MG/3DAYS TD PT72
1.0000 | MEDICATED_PATCH | Freq: Once | TRANSDERMAL | Status: DC
  Administered 2016-11-24: 1.5 mg via TRANSDERMAL

## 2016-11-24 MED ORDER — DEXAMETHASONE SODIUM PHOSPHATE 10 MG/ML IJ SOLN
INTRAMUSCULAR | Status: AC
  Filled 2016-11-24: qty 1

## 2016-11-24 MED ORDER — ALUM & MAG HYDROXIDE-SIMETH 200-200-20 MG/5ML PO SUSP: 30.0000 mL | ORAL | Status: DC | PRN

## 2016-11-24 MED ORDER — INFLUENZA VAC SPLIT HIGH-DOSE 0.5 ML IM SUSY
0.5000 mL | PREFILLED_SYRINGE | INTRAMUSCULAR | Status: AC
  Administered 2016-11-25: 0.5 mL via INTRAMUSCULAR
  Filled 2016-11-24: qty 0.5

## 2016-11-24 MED ORDER — LACTATED RINGERS IV SOLN
INTRAVENOUS | Status: DC
  Administered 2016-11-24 (×2): via INTRAVENOUS

## 2016-11-24 MED ORDER — CEFAZOLIN SODIUM-DEXTROSE 2-4 GM/100ML-% IV SOLN
INTRAVENOUS | Status: AC
  Filled 2016-11-24: qty 100

## 2016-11-24 MED ORDER — LIDOCAINE HCL (PF) 2 % IJ SOLN
INTRAMUSCULAR | Status: AC
  Filled 2016-11-24: qty 2

## 2016-11-24 MED ORDER — BISACODYL 10 MG RE SUPP
10.0000 mg | Freq: Every day | RECTAL | Status: DC | PRN
  Administered 2016-11-26: 10 mg via RECTAL
  Filled 2016-11-24: qty 1

## 2016-11-24 MED ORDER — ONDANSETRON HCL 4 MG/2ML IJ SOLN
INTRAMUSCULAR | Status: DC | PRN
  Administered 2016-11-24: 4 mg via INTRAVENOUS

## 2016-11-24 MED ORDER — POLYVINYL ALCOHOL 1.4 % OP SOLN
Freq: Three times a day (TID) | OPHTHALMIC | Status: DC
  Administered 2016-11-25 – 2016-11-26 (×3): via OPHTHALMIC
  Filled 2016-11-24: qty 15

## 2016-11-24 MED ORDER — OXYCODONE HCL 5 MG/5ML PO SOLN: 5.0000 mg | Freq: Once | ORAL | Status: DC | PRN

## 2016-11-24 MED ORDER — SENNOSIDES-DOCUSATE SODIUM 8.6-50 MG PO TABS
1.0000 | ORAL_TABLET | Freq: Two times a day (BID) | ORAL | Status: DC
  Administered 2016-11-24 – 2016-11-26 (×4): 1 via ORAL
  Filled 2016-11-24 (×4): qty 1

## 2016-11-24 MED ORDER — GLYCOPYRROLATE 0.2 MG/ML IJ SOLN
INTRAMUSCULAR | Status: AC
  Filled 2016-11-24: qty 1

## 2016-11-24 MED ORDER — FENTANYL CITRATE (PF) 100 MCG/2ML IJ SOLN
25.0000 ug | INTRAMUSCULAR | Status: DC | PRN
  Administered 2016-11-24 (×4): 25 ug via INTRAVENOUS

## 2016-11-24 MED ORDER — LOSARTAN POTASSIUM-HCTZ 100-12.5 MG PO TABS: 1.0000 | ORAL_TABLET | Freq: Every day | ORAL | Status: DC

## 2016-11-24 MED ORDER — PROPOFOL 500 MG/50ML IV EMUL
INTRAVENOUS | Status: DC | PRN
  Administered 2016-11-24: 45 ug/kg/min via INTRAVENOUS

## 2016-11-24 MED ORDER — CELECOXIB 200 MG PO CAPS
200.0000 mg | ORAL_CAPSULE | Freq: Two times a day (BID) | ORAL | Status: DC
  Administered 2016-11-24 – 2016-11-26 (×4): 200 mg via ORAL
  Filled 2016-11-24 (×4): qty 1

## 2016-11-24 MED ORDER — ACETAMINOPHEN 10 MG/ML IV SOLN
1000.0000 mg | Freq: Four times a day (QID) | INTRAVENOUS | Status: AC
  Administered 2016-11-24 – 2016-11-25 (×4): 1000 mg via INTRAVENOUS
  Filled 2016-11-24 (×4): qty 100

## 2016-11-24 MED ORDER — MIDAZOLAM HCL 5 MG/5ML IJ SOLN
INTRAMUSCULAR | Status: DC | PRN
  Administered 2016-11-24: 0.5 mg via INTRAVENOUS
  Administered 2016-11-24: 1 mg via INTRAVENOUS
  Administered 2016-11-24: 0.5 mg via INTRAVENOUS

## 2016-11-24 MED ORDER — ACETAMINOPHEN 10 MG/ML IV SOLN
INTRAVENOUS | Status: AC
  Filled 2016-11-24: qty 100

## 2016-11-24 MED ORDER — DIPHENHYDRAMINE HCL 12.5 MG/5ML PO ELIX
12.5000 mg | ORAL_SOLUTION | ORAL | Status: DC | PRN
  Filled 2016-11-24: qty 5
  Filled 2016-11-24: qty 10

## 2016-11-24 MED ORDER — PROMETHAZINE HCL 25 MG/ML IJ SOLN: 6.2500 mg | INTRAMUSCULAR | Status: DC | PRN

## 2016-11-24 MED ORDER — MENTHOL 3 MG MT LOZG: 1.0000 | LOZENGE | OROMUCOSAL | Status: DC | PRN

## 2016-11-24 MED ORDER — GLYCOPYRROLATE 0.2 MG/ML IJ SOLN
INTRAMUSCULAR | Status: DC | PRN
  Administered 2016-11-24: 0.2 mg via INTRAVENOUS

## 2016-11-24 MED ORDER — MIDAZOLAM HCL 2 MG/2ML IJ SOLN
INTRAMUSCULAR | Status: AC
  Filled 2016-11-24: qty 2

## 2016-11-24 MED ORDER — OCUVITE-LUTEIN PO CAPS
1.0000 | ORAL_CAPSULE | Freq: Two times a day (BID) | ORAL | Status: DC
  Administered 2016-11-24 – 2016-11-26 (×4): 1 via ORAL
  Filled 2016-11-24 (×4): qty 1

## 2016-11-24 MED ORDER — TRAMADOL HCL 50 MG PO TABS
50.0000 mg | ORAL_TABLET | ORAL | Status: DC | PRN
  Administered 2016-11-24: 50 mg via ORAL
  Administered 2016-11-26: 100 mg via ORAL
  Filled 2016-11-24: qty 2
  Filled 2016-11-24: qty 1

## 2016-11-24 MED ORDER — FENTANYL CITRATE (PF) 100 MCG/2ML IJ SOLN
INTRAMUSCULAR | Status: AC
  Administered 2016-11-24: 25 ug via INTRAVENOUS
  Filled 2016-11-24: qty 2

## 2016-11-24 SURGICAL SUPPLY — 62 items
BATTERY INSTRU NAVIGATION (MISCELLANEOUS) ×8 IMPLANT
BLADE SAW 1 (BLADE) ×2 IMPLANT
BLADE SAW 1/2 (BLADE) ×2 IMPLANT
BLADE SAW 70X12.5 (BLADE) IMPLANT
CANISTER SUCT 1200ML W/VALVE (MISCELLANEOUS) ×2 IMPLANT
CANISTER SUCT 3000ML PPV (MISCELLANEOUS) ×4 IMPLANT
CAPT KNEE TOTAL 3 ATTUNE ×2 IMPLANT
CATH TRAY METER 16FR LF (MISCELLANEOUS) ×2 IMPLANT
CEMENT HV SMART SET (Cement) ×4 IMPLANT
COOLER POLAR GLACIER W/PUMP (MISCELLANEOUS) ×2 IMPLANT
CUFF TOURN 24 STER (MISCELLANEOUS) IMPLANT
CUFF TOURN 30 STER DUAL PORT (MISCELLANEOUS) ×2 IMPLANT
DRAPE SHEET LG 3/4 BI-LAMINATE (DRAPES) ×2 IMPLANT
DRSG DERMACEA 8X12 NADH (GAUZE/BANDAGES/DRESSINGS) ×2 IMPLANT
DRSG OPSITE POSTOP 4X14 (GAUZE/BANDAGES/DRESSINGS) ×2 IMPLANT
DRSG TEGADERM 4X4.75 (GAUZE/BANDAGES/DRESSINGS) ×2 IMPLANT
DURAPREP 26ML APPLICATOR (WOUND CARE) ×4 IMPLANT
ELECT CAUTERY BLADE 6.4 (BLADE) ×2 IMPLANT
ELECT REM PT RETURN 9FT ADLT (ELECTROSURGICAL) ×2
ELECTRODE REM PT RTRN 9FT ADLT (ELECTROSURGICAL) ×1 IMPLANT
EVACUATOR 1/8 PVC DRAIN (DRAIN) ×2 IMPLANT
EX-PIN ORTHOLOCK NAV 4X150 (PIN) ×4 IMPLANT
GLOVE BIOGEL M STRL SZ7.5 (GLOVE) ×4 IMPLANT
GLOVE BIOGEL PI IND STRL 9 (GLOVE) ×1 IMPLANT
GLOVE BIOGEL PI INDICATOR 9 (GLOVE) ×1
GLOVE INDICATOR 8.0 STRL GRN (GLOVE) ×2 IMPLANT
GLOVE SURG SYN 9.0  PF PI (GLOVE) ×1
GLOVE SURG SYN 9.0 PF PI (GLOVE) ×1 IMPLANT
GOWN STRL REUS W/ TWL LRG LVL3 (GOWN DISPOSABLE) ×2 IMPLANT
GOWN STRL REUS W/TWL 2XL LVL3 (GOWN DISPOSABLE) ×2 IMPLANT
GOWN STRL REUS W/TWL LRG LVL3 (GOWN DISPOSABLE) ×2
HOLDER FOLEY CATH W/STRAP (MISCELLANEOUS) ×2 IMPLANT
HOOD PEEL AWAY FLYTE STAYCOOL (MISCELLANEOUS) ×4 IMPLANT
KIT RM TURNOVER STRD PROC AR (KITS) ×2 IMPLANT
KNIFE SCULPS 14X20 (INSTRUMENTS) ×2 IMPLANT
LABEL OR SOLS (LABEL) ×2 IMPLANT
NDL SAFETY 18GX1.5 (NEEDLE) ×2 IMPLANT
NEEDLE SPNL 20GX3.5 QUINCKE YW (NEEDLE) ×2 IMPLANT
NS IRRIG 500ML POUR BTL (IV SOLUTION) ×2 IMPLANT
PACK TOTAL KNEE (MISCELLANEOUS) ×2 IMPLANT
PAD WRAPON POLAR KNEE (MISCELLANEOUS) ×1 IMPLANT
PIN DRILL QUICK PACK ×2 IMPLANT
PIN FIXATION 1/8DIA X 3INL (PIN) ×2 IMPLANT
PULSAVAC PLUS IRRIG FAN TIP (DISPOSABLE) ×2
SOL .9 NS 3000ML IRR  AL (IV SOLUTION) ×1
SOL .9 NS 3000ML IRR UROMATIC (IV SOLUTION) ×1 IMPLANT
SOL PREP PVP 2OZ (MISCELLANEOUS) ×2
SOLUTION PREP PVP 2OZ (MISCELLANEOUS) ×1 IMPLANT
SPONGE DRAIN TRACH 4X4 STRL 2S (GAUZE/BANDAGES/DRESSINGS) ×2 IMPLANT
STAPLER SKIN PROX 35W (STAPLE) ×2 IMPLANT
STRAP TIBIA SHORT (MISCELLANEOUS) ×2 IMPLANT
SUCTION FRAZIER HANDLE 10FR (MISCELLANEOUS) ×1
SUCTION TUBE FRAZIER 10FR DISP (MISCELLANEOUS) ×1 IMPLANT
SUT VIC AB 0 CT1 36 (SUTURE) ×2 IMPLANT
SUT VIC AB 1 CT1 36 (SUTURE) ×4 IMPLANT
SUT VIC AB 2-0 CT2 27 (SUTURE) ×2 IMPLANT
SYR 20CC LL (SYRINGE) ×2 IMPLANT
SYR 30ML LL (SYRINGE) ×4 IMPLANT
TIP FAN IRRIG PULSAVAC PLUS (DISPOSABLE) ×1 IMPLANT
TOWEL OR 17X26 4PK STRL BLUE (TOWEL DISPOSABLE) ×2 IMPLANT
TOWER CARTRIDGE SMART MIX (DISPOSABLE) ×2 IMPLANT
WRAPON POLAR PAD KNEE (MISCELLANEOUS) ×2

## 2016-11-24 NOTE — Anesthesia Procedure Notes (Signed)
Spinal  Patient location during procedure: OR Start time: 11/24/2016 12:10 PM End time: 11/24/2016 12:18 PM Staffing Anesthesiologist: Emmie Niemann Performed: anesthesiologist  Preanesthetic Checklist Completed: patient identified, site marked, surgical consent, pre-op evaluation, timeout performed, IV checked, risks and benefits discussed and monitors and equipment checked Spinal Block Patient position: sitting Prep: ChloraPrep Patient monitoring: heart rate, continuous pulse ox, blood pressure and cardiac monitor Approach: midline Location: L4-5 Injection technique: single-shot Needle Needle type: Introducer and Pencil-Tip  Needle gauge: 24 G Needle length: 9 cm Additional Notes Negative paresthesia. Negative blood return. Positive free-flowing CSF. Expiration date of kit checked and confirmed. Patient tolerated procedure well, without complications.

## 2016-11-24 NOTE — Anesthesia Post-op Follow-up Note (Signed)
Anesthesia QCDR form completed.        

## 2016-11-24 NOTE — Anesthesia Preprocedure Evaluation (Signed)
Anesthesia Evaluation  Patient identified by MRN, date of birth, ID band Patient awake    Reviewed: Allergy & Precautions, NPO status , Patient's Chart, lab work & pertinent test results  History of Anesthesia Complications (+) PONV and history of anesthetic complications  Airway Mallampati: II  TM Distance: >3 FB Neck ROM: Full    Dental no notable dental hx.    Pulmonary shortness of breath and with exertion, sleep apnea and Continuous Positive Airway Pressure Ventilation , neg COPD,    breath sounds clear to auscultation- rhonchi (-) wheezing      Cardiovascular hypertension, Pt. on medications (-) CAD, (-) Past MI and (-) Cardiac Stents  Rhythm:Regular Rate:Normal - Systolic murmurs and - Diastolic murmurs    Neuro/Psych negative neurological ROS  negative psych ROS   GI/Hepatic negative GI ROS, Neg liver ROS,   Endo/Other  neg diabetesHypothyroidism   Renal/GU negative Renal ROS     Musculoskeletal  (+) Arthritis ,   Abdominal (+) - obese,   Peds  Hematology negative hematology ROS (+)   Anesthesia Other Findings Past Medical History: No date: Arthritis No date: Complication of anesthesia     Comment:  nausea No date: Dyspnea     Comment:  with exertion No date: Family history of adverse reaction to anesthesia     Comment:  PONV No date: Hypertension No date: Hypothyroidism No date: PONV (postoperative nausea and vomiting) No date: Stress incontinence   Reproductive/Obstetrics                             Anesthesia Physical Anesthesia Plan  ASA: II  Anesthesia Plan: Spinal   Post-op Pain Management:    Induction:   PONV Risk Score and Plan: 3 and Ondansetron, Dexamethasone, Midazolam and Propofol infusion  Airway Management Planned: Natural Airway  Additional Equipment:   Intra-op Plan:   Post-operative Plan:   Informed Consent: I have reviewed the patients  History and Physical, chart, labs and discussed the procedure including the risks, benefits and alternatives for the proposed anesthesia with the patient or authorized representative who has indicated his/her understanding and acceptance.   Dental advisory given  Plan Discussed with: Anesthesiologist and CRNA  Anesthesia Plan Comments:         Lab Results  Component Value Date   WBC 5.8 11/17/2016   HGB 14.0 11/17/2016   HCT 41.5 11/17/2016   MCV 81.7 11/17/2016   PLT 265 11/17/2016    Anesthesia Quick Evaluation

## 2016-11-24 NOTE — H&P (Signed)
The patient has been re-examined, and the chart reviewed, and there have been no interval changes to the documented history and physical.    The risks, benefits, and alternatives have been discussed at length. The patient expressed understanding of the risks benefits and agreed with plans for surgical intervention.  James P. Hooten, Jr. M.D.    

## 2016-11-24 NOTE — Op Note (Signed)
OPERATIVE NOTE  DATE OF SURGERY:  11/24/2016  PATIENT NAME:  Hannah Tucker   DOB: 06/01/1940  MRN: [id number]  PRE-OPERATIVE DIAGNOSIS: Degenerative arthrosis of the left knee, primary  POST-OPERATIVE DIAGNOSIS:  Same  PROCEDURE:  Left total knee arthroplasty using computer-assisted navigation  SURGEON:  Jena Gauss. M.D.  ASSISTANT:  Van Clines, PA (present and scrubbed throughout the case, critical for assistance with exposure, retraction, instrumentation, and closure)  ANESTHESIA: spinal  ESTIMATED BLOOD LOSS: 50 mL  FLUIDS REPLACED: 1000 mL of crystalloid  TOURNIQUET TIME: 109 minutes  DRAINS: 2 medium Hemovac drains  SOFT TISSUE RELEASES: Anterior cruciate ligament, posterior cruciate ligament, deep medial collateral ligament, patellofemoral ligament  IMPLANTS UTILIZED: DePuy Attune size 4N posterior stabilized femoral component (cemented), size 3 rotating platform tibial component (cemented), 32 mm medialized dome patella (cemented), and a 5 mm stabilized rotating platform polyethylene insert.  INDICATIONS FOR SURGERY: Hannah Tucker is a 76 y.o. year old female with a long history of progressive knee pain. X-rays demonstrated severe degenerative changes in tricompartmental fashion. The patient had not seen any significant improvement despite conservative nonsurgical intervention. After discussion of the risks and benefits of surgical intervention, the patient expressed understanding of the risks benefits and agree with plans for total knee arthroplasty.   The risks, benefits, and alternatives were discussed at length including but not limited to the risks of infection, bleeding, nerve injury, stiffness, blood clots, the need for revision surgery, cardiopulmonary complications, among others, and they were willing to proceed.  PROCEDURE IN DETAIL: The patient was brought into the operating room and, after adequate spinal anesthesia was achieved, a tourniquet was placed  on the patient's upper thigh. The patient's knee and leg were cleaned and prepped with alcohol and DuraPrep and draped in the usual sterile fashion. A "timeout" was performed as per usual protocol. The lower extremity was exsanguinated using an Esmarch, and the tourniquet was inflated to 300 mmHg. An anterior longitudinal incision was made followed by a standard mid vastus approach. The deep fibers of the medial collateral ligament were elevated in a subperiosteal fashion off of the medial flare of the tibia so as to maintain a continuous soft tissue sleeve. The patella was subluxed laterally and the patellofemoral ligament was incised. Inspection of the knee demonstrated severe degenerative changes with full-thickness loss of articular cartilage. Osteophytes were debrided using a rongeur. Anterior and posterior cruciate ligaments were excised. Two 4.0 mm Schanz pins were inserted in the femur and into the tibia for attachment of the array of trackers used for computer-assisted navigation. Hip center was identified using a circumduction technique. Distal landmarks were mapped using the computer. The distal femur and proximal tibia were mapped using the computer. The distal femoral cutting guide was positioned using computer-assisted navigation so as to achieve a 5 distal valgus cut. The femur was sized and it was felt that a size 4N femoral component was appropriate. A size 4 femoral cutting guide was positioned and the anterior cut was performed and verified using the computer. This was followed by completion of the posterior and chamfer cuts. Femoral cutting guide for the central box was then positioned in the center box cut was performed.  Attention was then directed to the proximal tibia. Medial and lateral menisci were excised. The extramedullary tibial cutting guide was positioned using computer-assisted navigation so as to achieve a 0 varus-valgus alignment and 3 posterior slope. The cut was performed  and verified using the computer. The proximal tibia  was sized and it was felt that a size 3 tibial tray was appropriate. Tibial and femoral trials were inserted followed by insertion of a 5 mm polyethylene insert. This allowed for excellent mediolateral soft tissue balancing both in flexion and in full extension. Finally, the patella was cut and prepared so as to accommodate a 32 mm medialized dome patella. A patella trial was placed and the knee was placed through a range of motion with excellent patellar tracking appreciated. The femoral trial was removed after debridement of posterior osteophytes. The central post-hole for the tibial component was reamed followed by insertion of a keel punch. Tibial trials were then removed. Cut surfaces of bone were irrigated with copious amounts of normal saline with antibiotic solution using pulsatile lavage and then suctioned dry. Polymethylmethacrylate cement was prepared in the usual fashion using a vacuum mixer. Cement was applied to the cut surface of the proximal tibia as well as along the undersurface of a size 3 rotating platform tibial component. Tibial component was positioned and impacted into place. Excess cement was removed using Personal assistant. Cement was then applied to the cut surfaces of the femur as well as along the posterior flanges of the size 4N femoral component. The femoral component was positioned and impacted into place. Excess cement was removed using Personal assistant. A 5 mm polyethylene trial was inserted and the knee was brought into full extension with steady axial compression applied. Finally, cement was applied to the backside of a 32 mm medialized dome patella and the patellar component was positioned and patellar clamp applied. Excess cement was removed using Personal assistant. After adequate curing of the cement, the tourniquet was deflated after a total tourniquet time of 109 minutes. Hemostasis was achieved using electrocautery. The knee was  irrigated with copious amounts of normal saline with antibiotic solution using pulsatile lavage and then suctioned dry. 20 mL of 1.3% Exparel and 60 mL of 0.25% Marcaine in 40 mL of normal saline was injected along the posterior capsule, medial and lateral gutters, and along the arthrotomy site. A 5 mm stabilized rotating platform polyethylene insert was inserted and the knee was placed through a range of motion with excellent mediolateral soft tissue balancing appreciated and excellent patellar tracking noted. 2 medium drains were placed in the wound bed and brought out through separate stab incisions. The medial parapatellar portion of the incision was reapproximated using interrupted sutures of #1 Vicryl. Subcutaneous tissue was approximated in layers using first #0 Vicryl followed #2-0 Vicryl. The skin was approximated with skin staples. A sterile dressing was applied.  The patient tolerated the procedure well and was transported to the recovery room in stable condition.    James P. Angie Fava., M.D.

## 2016-11-24 NOTE — Progress Notes (Signed)
Pt in no acute distress. Can slightly move toes and has decreased sensation in lower extremities. Eating and drinking well. Tolerating bone foam well. BP is elevated, MD called and new orders received. BP is improving.  11/24/16 2101 11/24/16 2202 11/24/16 2302  Vitals  BP (!) 177/75 (!) 157/68 140/63  MAP (mmHg) 102 91 82  BP Method Automatic Automatic Automatic  Pulse Rate 63 70 71  Pulse Rate Source Monitor Monitor Monitor  Oxygen Therapy  SpO2 100 % 99 % 96 %

## 2016-11-24 NOTE — Anesthesia Procedure Notes (Signed)
Performed by: Sherman Lipuma Pre-anesthesia Checklist: Patient identified, Emergency Drugs available, Suction available, Patient being monitored and Timeout performed Oxygen Delivery Method: Simple face mask       

## 2016-11-24 NOTE — Transfer of Care (Signed)
Immediate Anesthesia Transfer of Care Note  Patient: Hannah Tucker  Procedure(s) Performed: Procedure(s): COMPUTER ASSISTED TOTAL KNEE ARTHROPLASTY (Left)  Patient Location: PACU  Anesthesia Type:Spinal  Level of Consciousness: awake, alert  and oriented  Airway & Oxygen Therapy: Patient Spontanous Breathing and Patient connected to face mask oxygen  Post-op Assessment: Report given to RN and Post -op Vital signs reviewed and stable  Post vital signs: Reviewed and stable  Last Vitals:  Vitals:   11/24/16 1018 11/24/16 1555  BP: (!) 161/80 140/73  Pulse: 77 78  Resp: 18 18  Temp: 36.6 C 36.6 C  SpO2: 100% 100%    Last Pain:  Vitals:   11/24/16 1555  TempSrc: Temporal  PainSc:          Complications: No apparent anesthesia complications

## 2016-11-24 NOTE — Progress Notes (Signed)
Pt. Has a home cpap unit. The cords were inspected for frays and tears. Unit looks good and in good working condition. biomed called.

## 2016-11-25 ENCOUNTER — Encounter: Payer: Self-pay | Admitting: Orthopedic Surgery

## 2016-11-25 LAB — BASIC METABOLIC PANEL
ANION GAP: 7 (ref 5–15)
BUN: 15 mg/dL (ref 6–20)
CHLORIDE: 100 mmol/L — AB (ref 101–111)
CO2: 28 mmol/L (ref 22–32)
Calcium: 8.2 mg/dL — ABNORMAL LOW (ref 8.9–10.3)
Creatinine, Ser: 0.71 mg/dL (ref 0.44–1.00)
GFR calc Af Amer: >60 mL/min (ref >60)
GLUCOSE: 141 mg/dL — AB (ref 65–99)
POTASSIUM: 3.9 mmol/L (ref 3.5–5.1)
Sodium: 135 mmol/L (ref 135–145)

## 2016-11-25 LAB — CBC
HEMATOCRIT: 35.1 % (ref 35.0–47.0)
HEMOGLOBIN: 12 g/dL (ref 12.0–16.0)
MCH: 27.4 pg (ref 26.0–34.0)
MCHC: 34 g/dL (ref 32.0–36.0)
MCV: 80.6 fL (ref 80.0–100.0)
Platelets: 226 10*3/uL (ref 150–440)
RBC: 4.36 MIL/uL (ref 3.80–5.20)
RDW: 14.3 % (ref 11.5–14.5)
WBC: 8.2 10*3/uL (ref 3.6–11.0)

## 2016-11-25 MED ORDER — DEXTROSE 5 % IV SOLN
2.0000 g | Freq: Once | INTRAVENOUS | Status: AC
  Administered 2016-11-25: 2 g via INTRAVENOUS
  Filled 2016-11-25: qty 20

## 2016-11-25 MED ORDER — OXYCODONE HCL 5 MG PO TABS: 5.0000 mg | ORAL_TABLET | ORAL | 0 refills | Status: DC | PRN

## 2016-11-25 MED ORDER — TRAMADOL HCL 50 MG PO TABS: 50.0000 mg | ORAL_TABLET | ORAL | 0 refills | Status: DC | PRN

## 2016-11-25 MED ORDER — ENOXAPARIN SODIUM 30 MG/0.3ML ~~LOC~~ SOLN: 40.0000 mg | SUBCUTANEOUS | 0 refills | Status: DC

## 2016-11-25 NOTE — Anesthesia Postprocedure Evaluation (Signed)
Anesthesia Post Note  Patient: Hannah Tucker  Procedure(s) Performed: Procedure(s) (LRB): COMPUTER ASSISTED TOTAL KNEE ARTHROPLASTY (Left)  Patient location during evaluation: Other Anesthesia Type: Spinal Level of consciousness: oriented and awake and alert Pain management: pain level controlled Vital Signs Assessment: post-procedure vital signs reviewed and stable Respiratory status: spontaneous breathing, respiratory function stable and patient connected to nasal cannula oxygen Cardiovascular status: blood pressure returned to baseline and stable Postop Assessment: no headache, no backache and no apparent nausea or vomiting Anesthetic complications: no     Last Vitals:  Vitals:   11/25/16 0403 11/25/16 0715  BP: (!) 110/50 (!) 125/49  Pulse: 62 (!) 54  Resp: 16   Temp: 37 C 36.5 C  SpO2: 96% 98%    Last Pain:  Vitals:   11/25/16 0715  TempSrc: Oral  PainSc:                  Lenward Chancellor

## 2016-11-25 NOTE — NC FL2 (Signed)
[place] MEDICAID FL2 LEVEL OF CARE SCREENING TOOL     IDENTIFICATION  Patient Name: Hannah Tucker Birthdate: 31-Mar-1940 Sex: female Admission Date (Current Location): 11/24/2016  [place] and [place] Number:  Chiropodist and Address:  [facility], [address], [place], [place] [address]      Provider Number: [id number]  Attending Physician Name and Address:  Donato Heinz, MD  Relative Name and Phone Number:       Current Level of Care: Hospital Recommended Level of Care: Skilled Nursing Facility Prior Approval Number:    Date Approved/Denied:   PASRR Number:  [id number])  Discharge Plan: SNF    Current Diagnoses: Patient Active Problem List   Diagnosis Date Noted  . 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 )                       Personal Care Assistance Level of Assistance  Bathing, Feeding, Dressing Bathing Assistance: Limited assistance Feeding assistance: Independent Dressing Assistance: Limited assistance     Functional Limitations Info  Sight, Hearing, Speech Sight Info: Adequate Hearing Info: Adequate Speech Info: Adequate    SPECIAL CARE FACTORS FREQUENCY  PT (By licensed PT), OT (By licensed OT)     PT Frequency:  (5) OT Frequency:  (5)            Contractures      Additional Factors Info  Code Status, Allergies Code Status Info:  (Full Code. ) Allergies Info:  (Biaxin Clarithromycin, Ciprofloxacin)           Current Medications (11/25/2016):  This is the current hospital active medication list Current  Facility-Administered Medications  Medication Dose Route Frequency Provider Last Rate Last Dose  . 0.9 %  sodium chloride infusion   Intravenous Continuous Hooten, Illene Labrador, MD   Stopped at 11/24/16 2318  . acetaminophen (OFIRMEV) IV 1,000 mg  1,000 mg Intravenous Q6H Hooten, Illene Labrador, MD   Stopped at 11/25/16 [phone]  . acetaminophen (TYLENOL) tablet 650 mg  650 mg Oral Q6H PRN Hooten, Illene Labrador, MD       Or  . acetaminophen (TYLENOL) suppository 650 mg  650 mg Rectal Q6H PRN Hooten, Illene Labrador, MD      . alum & mag hydroxide-simeth (MAALOX/MYLANTA) 200-200-20 MG/5ML suspension 30 mL  30 mL Oral Q4H PRN Hooten, Illene Labrador, MD      . bisacodyl (DULCOLAX) suppository 10 mg  10 mg Rectal Daily PRN Hooten, Illene Labrador, MD      . calcium-vitamin D (OSCAL WITH D) 500-200 MG-UNIT per tablet 1 tablet  1 tablet Oral QPC breakfast Hooten, Illene Labrador, MD   1 tablet at 11/25/16 0906  . ceFAZolin (ANCEF) IVPB 2g/100 mL premix  2 g Intravenous Q6H Hooten, Illene Labrador, MD   Stopped at 11/25/16 0745  . celecoxib (CELEBREX) capsule 200 mg  200 mg Oral Q12H Hooten, Illene Labrador, MD   200 mg at 11/25/16 0905  . clotrimazole (LOTRIMIN) 1 % cream 1 application  1 application Topical Daily PRN Hooten, Illene Labrador, MD      .  diphenhydrAMINE (BENADRYL) 12.5 MG/5ML elixir 12.5-25 mg  12.5-25 mg Oral Q4H PRN Hooten, Illene Labrador, MD      . enoxaparin (LOVENOX) injection 30 mg  30 mg Subcutaneous Q12H Hooten, Illene Labrador, MD   30 mg at 11/25/16 0715  . ferrous sulfate tablet 325 mg  325 mg Oral BID WC Hooten, Illene Labrador, MD   325 mg at 11/25/16 0715  . losartan (COZAAR) tablet 100 mg  100 mg Oral Daily Hooten, Illene Labrador, MD   100 mg at 11/25/16 1610   And  . hydrochlorothiazide (MICROZIDE) capsule 12.5 mg  12.5 mg Oral Daily Donato Heinz, MD   12.5 mg at 11/25/16 0906  . levothyroxine (SYNTHROID, LEVOTHROID) tablet 88 mcg  88 mcg Oral QAC breakfast Donato Heinz, MD   88 mcg at 11/25/16 0610  . magnesium hydroxide (MILK OF MAGNESIA) suspension 30 mL  30 mL Oral  Daily PRN Donato Heinz, MD   30 mL at 11/24/16 2047  . menthol-cetylpyridinium (CEPACOL) lozenge 3 mg  1 lozenge Oral PRN Hooten, Illene Labrador, MD       Or  . phenol (CHLORASEPTIC) mouth spray 1 spray  1 spray Mouth/Throat PRN Hooten, Illene Labrador, MD      . metoCLOPramide (REGLAN) tablet 10 mg  10 mg Oral TID AC & HS Hooten, Illene Labrador, MD   10 mg at 11/25/16 0715  . morphine 2 MG/ML injection 2 mg  2 mg Intravenous Q2H PRN Hooten, Illene Labrador, MD      . multivitamin with minerals tablet 1 tablet  1 tablet Oral Daily Hooten, Illene Labrador, MD   1 tablet at 11/25/16 0905  . multivitamin-lutein (OCUVITE-LUTEIN) capsule 1 capsule  1 capsule Oral BID Hooten, Illene Labrador, MD   1 capsule at 11/25/16 0905  . ondansetron (ZOFRAN) tablet 4 mg  4 mg Oral Q6H PRN Hooten, Illene Labrador, MD   4 mg at 11/24/16 1814   Or  . ondansetron (ZOFRAN) injection 4 mg  4 mg Intravenous Q6H PRN Hooten, Illene Labrador, MD      . oxyCODONE (Oxy IR/ROXICODONE) immediate release tablet 5-10 mg  5-10 mg Oral Q4H PRN Donato Heinz, MD   10 mg at 11/25/16 0715  . pantoprazole (PROTONIX) EC tablet 40 mg  40 mg Oral BID Donato Heinz, MD   40 mg at 11/25/16 0906  . polyvinyl alcohol (LIQUIFILM TEARS) 1.4 % ophthalmic solution 1 drop  1 drop Both Eyes QHS Hooten, Illene Labrador, MD   1 drop at 11/24/16 2053  . polyvinyl alcohol (LIQUIFILM TEARS) 1.4 % ophthalmic solution   Both Eyes TID Hooten, Illene Labrador, MD      . scopolamine (TRANSDERM-SCOP) 1 MG/3DAYS 1.5 mg  1 patch Transdermal Once Lenard Simmer, MD   1.5 mg at 11/24/16 1010  . senna-docusate (Senokot-S) tablet 1 tablet  1 tablet Oral BID Donato Heinz, MD   1 tablet at 11/25/16 0905  . sodium phosphate (FLEET) 7-19 GM/118ML enema 1 enema  1 enema Rectal Once PRN Hooten, Illene Labrador, MD      . traMADol Janean Sark) tablet 50-100 mg  50-100 mg Oral Q4H PRN Donato Heinz, MD   50 mg at 11/24/16 2049     Discharge Medications: Please see discharge summary for a list of discharge medications.  Relevant Imaging  Results:  Relevant Lab Results:   Additional Information  (SSN: [id number])  Ulysse Siemen, Darleen Crocker, LCSW

## 2016-11-25 NOTE — Progress Notes (Signed)
Clinical Social Worker (CSW) received SNF consult. PT is recommending home health. RN case manager aware of above. Please reconsult if future social work needs arise. CSW signing off.   Riely Oetken, LCSW [phone] 

## 2016-11-25 NOTE — Care Management Note (Addendum)
Case Management Note  Patient Details  Name: Hannah Tucker MRN: [id number] Date of Birth: [birth year]  Subjective/Objective:                  Met with patient and her husband to discuss discharge planning. She would like to go home at discharge. She has picked [facility] for home health PT if they cover her area. She requests a walker and a bedside commode. She uses [facility] [phone] for medications.  Action/Plan: Referral called to [facility] to see if they cover that zip code. [facility] for bedside commode and front-wheeled walker. Per Dr. Skip Estimable verbal order to call in Lovenox (generic fine too) 93m injection daily for 14 days- no refills to [facility]. RNCM will continue to follow. Lovenox cot $5.  Expected Discharge Date:                  Expected Discharge Plan:     In-House Referral:     Discharge planning Services  CM Consult  Post Acute Care Choice:  Home Health, Durable Medical Equipment Choice offered to:  Patient, Spouse  DME Arranged:  3-N-1, Walker rolling DME Agency:  [facility] [place]  [facility]  Status of Service:  In process, will continue to follow  If discussed at Long Length of Stay Meetings, dates discussed:    Additional Comments:  AMarshell Garfinkel RN 11/25/2016, 9:38 AM

## 2016-11-25 NOTE — Evaluation (Signed)
Occupational Therapy Evaluation Patient Details Name: Hannah Tucker MRN: [id number] DOB: 08/04/1940 Today's Date: 11/25/2016    History of Present Illness 76 y/o female s/p L TKA 9/24.   Clinical Impression   Pt is 76 year old female s/p L TKA, POD#1 for OT Evaluation.  Pt was independent in all ADLs prior to surgery and is eager to return to PLOF.  Pt currently requires min guard assist for LB dressing while in seated position with use of AE after initial education/training due to pain and limited AROM of L knee.  Pt would benefit from additional instruction in dressing techniques with or without assistive devices for dressing and bathing skills.  Pt would also benefit from recommendations for home modifications to increase safety in the bathroom and prevent falls. Recommend [name] for use at home to maximize safety and functional independence with toileting and bathing tasks. Do not anticipate any skilled OT needs following discharge home from this hospitalization.    Follow Up Recommendations  No OT follow up    Equipment Recommendations  3 in 1 bedside commode    Recommendations for Other Services       Precautions / Restrictions Precautions Precautions: Knee Precaution Booklet Issued: No Restrictions Weight Bearing Restrictions: Yes LLE Weight Bearing: Weight bearing as tolerated      Mobility Bed Mobility             General bed mobility comments: deferred, pt up in recliner for session  Transfers Overall transfer level: Independent Equipment used: Rolling walker (2 wheeled)                Balance Overall balance assessment: No apparent balance deficits (not formally assessed)                                         ADL either performed or assessed with clinical judgement   ADL Overall ADL's : Needs assistance/impaired             Lower Body Bathing: Set up;Sitting/lateral leans       Lower Body Dressing: Sitting/lateral  leans;Min guard;Sit to/from stand;With adaptive equipment Lower Body Dressing Details (indicate cue type and reason): Pt educated in AE for LB dressing skills with verbal instruction and visual demo. Pt verbalized understanding. Toilet Transfer: Automotive engineer Details (indicate cue type and reason): Educated pt on use of [name] over toilet to increase height for comfort/safety         Functional mobility during ADLs: Min guard;Rolling walker       Vision Baseline Vision/History: Macular Degeneration;Wears glasses Wears Glasses: Distance only Patient Visual Report: No change from baseline Vision Assessment?: No apparent visual deficits Additional Comments: Educated pt in tinted sunglasses lenses to support reduced glare and maximizing safety given macular degeneration. Pt verbalized understanding.     Perception     Praxis      Pertinent Vitals/Pain Pain Assessment: 0-10 Pain Score: 1  Pain Location: L knee Pain Descriptors / Indicators: Aching Pain Intervention(s): Limited activity within patient's tolerance;Monitored during session;Premedicated before session;Ice applied     Hand Dominance Right   Extremity/Trunk Assessment Upper Extremity Assessment Upper Extremity Assessment: Overall WFL for tasks assessed   Lower Extremity Assessment Lower Extremity Assessment: Overall WFL for tasks assessed;Defer to PT evaluation   Cervical / Trunk Assessment Cervical / Trunk Assessment: Normal   Communication Communication Communication: No  difficulties   Cognition Arousal/Alertness: Awake/alert Behavior During Therapy: WFL for tasks assessed/performed Overall Cognitive Status: Within Functional Limits for tasks assessed                                     General Comments       Exercises Other Exercises Other Exercises: Pt educated in polar care mgt and compression stockings mgt with verbal instruction and visual  demonstration, pt verbalized understanding. Other Exercises: Pt educated in home/routines modifications and falls prevention strategies to support safety/functional independence. Pt verbalized understanding. Other Exercises: Pt educated in functional mobility training with emphasis on negotiating the kitchen environment and environmental set up with verbal instruction and visual demo to minimize bending over/reaching overhead and maximizing safety. Pt verbalized understanding.   Shoulder Instructions      Home Living Family/patient expects to be discharged to:: Private residence Living Arrangements: Spouse/significant other Available Help at Discharge: Family;Available 24 hours/day Type of Home: House Home Access: Stairs to enter [employer] of Steps: 3 Entrance Stairs-Rails: Left;Right (cannot reach both) Home Layout: Laundry or work area in basement;Full bath on main level;Able to live on main level with bedroom/bathroom     Bathroom Shower/Tub: Tub/shower unit   Bathroom Toilet: Standard     Home Equipment: Walker - 4 wheels;Hand held shower head;Adaptive equipment Adaptive Equipment: Reacher        Prior Functioning/Environment Level of Independence: Independent        Comments: Pt has been having more pain recently, but is able to stay active and has been doing some prehabbing. Indep with ADL, IADL, driving. Spouse does laundry in basement. No falls in past 12 months. No difficulties managing bladder/stress incontinence.        OT Problem List: Decreased range of motion;Decreased knowledge of use of DME or AE      OT Treatment/Interventions: Self-care/ADL training;DME and/or AE instruction;Patient/family education    OT Goals(Current goals can be found in the care plan section) Acute Rehab OT Goals Patient Stated Goal: go home OT Goal Formulation: With patient Time For Goal Achievement: 12/02/16 Potential to Achieve Goals: Good  OT Frequency: Min  1X/week   Barriers to D/C:            Co-evaluation              AM-PAC PT "6 Clicks" Daily Activity     Outcome Measure Help from another person eating meals?: None Help from another person taking care of personal grooming?: None Help from another person toileting, which includes using toliet, bedpan, or urinal?: A Little Help from another person bathing (including washing, rinsing, drying)?: A Little Help from another person to put on and taking off regular upper body clothing?: None Help from another person to put on and taking off regular lower body clothing?: A Little 6 Click Score: 21   End of Session Nurse Communication: Other (comment) (notified RN of IVF completion)  Activity Tolerance: Patient tolerated treatment well Patient left: in chair;with call bell/phone within reach;with chair alarm set;Other (comment) (polar care in place)  OT Visit Diagnosis: Other abnormalities of gait and mobility (R26.89)                Time: 0981-1914 OT Time Calculation (min): 36 min Charges:  OT General Charges $OT Visit: 1 Visit OT Evaluation $OT Eval Low Complexity: 1 Low OT Treatments $Self Care/Home Management : 8-22 mins G-Codes: OT  G-codes **NOT FOR INPATIENT CLASS** Functional Assessment Tool Used: AM-PAC 6 Clicks Daily Activity;Clinical judgement Functional Limitation: Self care Self Care Current Status (Z6109): At least 20 percent but less than 40 percent impaired, limited or restricted Self Care Goal Status (U0454): At least 1 percent but less than 20 percent impaired, limited or restricted   Richrd Prime, MPH, MS, OTR/L ascom [phone] 11/25/16, 11:19 AM

## 2016-11-25 NOTE — Discharge Summary (Signed)
Physician Discharge Summary  Patient ID: Hannah Tucker MRN: [id number] DOB/AGE: 1940/08/18 76 y.o.  Admit date: 11/24/2016 Discharge date: 11/26/2016  Admission Diagnoses:  PRIMARY OSTEOARTHRITIS OF LEFT KNEE   Discharge Diagnoses: Patient Active Problem List   Diagnosis Date Noted  . 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 Arthritis   . Complication of anesthesia    nausea  . Dyspnea    with exertion  . Family history of adverse reaction to anesthesia    PONV  . Hypertension   . Hypothyroidism   . PONV (postoperative nausea and vomiting)   . Stress incontinence      Transfusion: no transfusion during this admission   Consultants (if any):   Discharged Condition: Improved  Hospital Course: Hannah Tucker is an 76 y.o. female who was admitted 11/24/2016 with a diagnosis of degenerative arthrosis of left knee and went to the operating room on 11/24/2016 and underwent the above named procedures.    Surgeries:Procedure(s): COMPUTER ASSISTED TOTAL KNEE ARTHROPLASTY on 11/24/2016  PRE-OPERATIVE DIAGNOSIS: Degenerative arthrosis of the left knee, primary  POST-OPERATIVE DIAGNOSIS:  Same  PROCEDURE:  Left total knee arthroplasty using computer-assisted navigation  SURGEON:  Jena Gauss. M.D.  ASSISTANT:  Van Clines, PA (present and scrubbed throughout the case, critical for assistance with exposure, retraction, instrumentation, and closure)  ANESTHESIA: spinal  ESTIMATED BLOOD LOSS: 50 mL  FLUIDS REPLACED: 1000 mL of crystalloid  TOURNIQUET TIME: 109 minutes  DRAINS: 2 medium Hemovac drains  SOFT TISSUE RELEASES: Anterior cruciate ligament, posterior cruciate ligament, deep medial collateral ligament, patellofemoral ligament  IMPLANTS UTILIZED: DePuy Attune size 4N posterior stabilized femoral component (cemented), size 3 rotating platform tibial component (cemented), 32 mm medialized dome patella  (cemented), and a 5 mm stabilized rotating platform polyethylene insert.  INDICATIONS FOR SURGERY: Hannah Tucker is a 76 y.o. year old female with a long history of progressive knee pain. X-rays demonstrated severe degenerative changes in tricompartmental fashion. The patient had not seen any significant improvement despite conservative nonsurgical intervention. After discussion of the risks and benefits of surgical intervention, the patient expressed understanding of the risks benefits and agree with plans for total knee arthroplasty.   The risks, benefits, and alternatives were discussed at length including but not limited to the risks of infection, bleeding, nerve injury, stiffness, blood clots, the need for revision surgery, cardiopulmonary complications, among others, and they were willing to proceed. Patient tolerated the surgery well. No complications .Patient was taken to PACU where she was stabilized and then transferred to the orthopedic floor.  Patient started on Lovenox 30 q 12 hrs. Foot pumps applied bilaterally at 80 mm hgb. Heels elevated off bed with rolled towels. No evidence of DVT. Calves non tender. Negative Homan. Physical therapy started on day #1 for gait training and transfer with OT starting on  day #1 for ADL and assisted devices. Patient has done well with therapy. Ambulated greater than 200 feet upon being discharged.  Patient's IV And Foley were discontinued on day #1 with Hemovac being discontinued on day #2. Dressing was changed on day 2 prior to patient being discharged   She was given perioperative antibiotics:  Anti-infectives    Start     Dose/Rate Route Frequency Ordered Stop   11/24/16 1900  ceFAZolin (ANCEF) IVPB 2g/100 mL premix     2 g 200 mL/hr over 30 Minutes Intravenous Every 6 hours 11/24/16 1745 11/25/16 1859  11/24/16 0953  ceFAZolin (ANCEF) 2-4 GM/100ML-% IVPB    Comments:  Carma Lair   : cabinet override      11/24/16 0953 11/24/16 2159    11/24/16 0600  ceFAZolin (ANCEF) IVPB 2g/100 mL premix  Status:  Discontinued     2 g 200 mL/hr over 30 Minutes Intravenous On call to O.R. 11/23/16 2219 11/24/16 9147    .  She was fitted with AV 1 compression foot pump devices, instructed on heel pumps, early ambulation, and fitted with TED stockings bilaterally for DVT prophylaxis.  She benefited maximally from the hospital stay and there were no complications.    Recent vital signs:  Vitals:   11/25/16 0403 11/25/16 0715  BP: (!) 110/50 (!) 125/49  Pulse: 62 (!) 54  Resp: 16   Temp: 98.6 F (37 C) 97.7 F (36.5 C)  SpO2: 96% 98%    Recent laboratory studies:  Lab Results  Component Value Date   HGB 12.0 11/25/2016   HGB 14.0 11/17/2016   HGB 15.0 07/18/2015   Lab Results  Component Value Date   WBC 8.2 11/25/2016   PLT 226 11/25/2016   Lab Results  Component Value Date   INR 0.95 11/17/2016   Lab Results  Component Value Date   NA 135 11/25/2016   K 3.9 11/25/2016   CL 100 (L) 11/25/2016   CO2 28 11/25/2016   BUN 15 11/25/2016   CREATININE 0.71 11/25/2016   GLUCOSE 141 (H) 11/25/2016    Discharge Medications:   Allergies as of 11/26/2016      Reactions   Benadryl [diphenhydramine Hcl] Other (See Comments)   Pt unsure what reaction is but says she has not taken this in years because of a reaction   Biaxin [clarithromycin] Other (See Comments)   Unknown   Ciprofloxacin Other (See Comments)   Joint pain      Medication List    STOP taking these medications   amoxicillin 500 MG capsule Commonly known as:  AMOXIL   naproxen sodium 220 MG tablet Commonly known as:  ANAPROX     TAKE these medications   acetaminophen 500 MG tablet Commonly known as:  TYLENOL Take 500 mg by mouth every 6 (six) hours as needed for moderate pain or headache.   CALCIUM 600 + D PO Take 1 tablet by mouth daily.   clotrimazole-betamethasone cream Commonly known as:  LOTRISONE Apply 1 application topically daily  as needed.   diclofenac sodium 1 % Gel Commonly known as:  VOLTAREN Apply 2 g topically as needed (for pain).   enoxaparin 30 MG/0.3ML injection Commonly known as:  LOVENOX Inject 0.4 mLs (40 mg total) into the skin daily.   levothyroxine 88 MCG tablet Commonly known as:  SYNTHROID, LEVOTHROID Take 88 mcg by mouth daily before breakfast.   losartan-hydrochlorothiazide 100-12.5 MG tablet Commonly known as:  HYZAAR Take 1 tablet by mouth daily.   multivitamin capsule Take 1 capsule by mouth daily.   oxyCODONE 5 MG immediate release tablet Commonly known as:  Oxy IR/ROXICODONE Take 1-2 tablets (5-10 mg total) by mouth every 4 (four) hours as needed for severe pain or breakthrough pain.   PRESERVISION AREDS 2+MULTI VIT PO Take 1 capsule by mouth 2 (two) times daily.   SYSTANE 0.4-0.3 % Gel ophthalmic gel Generic drug:  Polyethyl Glycol-Propyl Glycol Place 1 application into both eyes at bedtime.   SYSTANE OP Place 1 drop into both eyes 3 (three) times daily.   traMADol 50 MG  tablet Commonly known as:  ULTRAM Take 1-2 tablets (50-100 mg total) by mouth every 4 (four) hours as needed for moderate pain.            Durable Medical Equipment        Start     Ordered   11/24/16 1746  DME Walker rolling  Once    Question:  Patient needs a walker to treat with the following condition  Answer:  Total knee replacement status   11/24/16 1745   11/24/16 1746  DME Bedside commode  Once    Question:  Patient needs a bedside commode to treat with the following condition  Answer:  Total knee replacement status   11/24/16 1745       Discharge Care Instructions        Start     Ordered   11/26/16 0000  Increase activity slowly     11/26/16 0707   11/26/16 0000  Diet - low sodium heart healthy     11/26/16 0707   11/26/16 0000  Increase activity slowly     11/26/16 0709   11/26/16 0000  Diet - low sodium heart healthy     11/26/16 0709   11/26/16 0000  Increase  activity slowly     11/26/16 0711   11/26/16 0000  Diet - low sodium heart healthy     11/26/16 0711   11/25/16 0000  enoxaparin (LOVENOX) 30 MG/0.3ML injection  Every 24 hours     11/25/16 0759   11/25/16 0000  oxyCODONE (OXY IR/ROXICODONE) 5 MG immediate release tablet  Every 4 hours PRN     11/25/16 0759   11/25/16 0000  traMADol (ULTRAM) 50 MG tablet  Every 4 hours PRN     11/25/16 0759   11/25/16 0000  Increase activity slowly     11/25/16 0759   11/25/16 0000  Diet - low sodium heart healthy     11/25/16 0759      Diagnostic Studies: Dg Knee Left Port  Result Date: 11/24/2016 CLINICAL DATA:  Initial evaluation status post left knee replacement. EXAM: PORTABLE LEFT KNEE - 1-2 VIEW COMPARISON:  Prior study from 07/31/2015. FINDINGS: Postoperative changes from interval left total knee arthroplasty. Femoral and tibial components appear well seated without complication. No fracture. Postoperative soft tissue swelling present about the knee. Surgical drain remains in place. Skin staples overlie the knee. IMPRESSION: Postoperative changes from recent total knee arthroplasty without complication. Electronically Signed   By: Rise Mu M.D.   On: 11/24/2016 16:33    Disposition: 01-Home or Self Care    Follow-up Information    Tera Partridge, PA On 09/09/2016.   Specialty:  Physician Assistant Why:  at 1:15pm Contact information: [address] [facility] [place] [address] [phone]        Donato Heinz, MD On 10/07/2016.   Specialty:  Orthopedic Surgery Why:  at 10:00am Contact information: [address] [facility] [place] [address] [phone]            Signed: Tera Partridge 11/25/2016, 7:54 AM

## 2016-11-25 NOTE — Evaluation (Signed)
Physical Therapy Evaluation Patient Details Name: Hannah Tucker MRN: [id number] DOB: 1940/05/25 Today's Date: 11/25/2016   History of Present Illness  76 y/o female s/p L TKA 9/24.  Clinical Impression  Pt did very well with first session post surgery, and generally easily achieved typical POD1 goals.  She confidently did SLRs, had >90 of flexion, was able to walk 60 ft with good cadence and safety and was not overly reliant on the walker.  Pt very eager to work with PT and showed good effort despite some initial hesitation; by the end of session was much more confident.  Pt will be able to return home on discharge.    Follow Up Recommendations Home health PT    Equipment Recommendations  Rolling walker with 5" wheels    Recommendations for Other Services       Precautions / Restrictions Precautions Precautions: Knee Restrictions Weight Bearing Restrictions: Yes LLE Weight Bearing: Weight bearing as tolerated      Mobility  Bed Mobility Overal bed mobility: Independent                Transfers Overall transfer level: Independent Equipment used: Rolling walker (2 wheeled)             General transfer comment: minimal cuing to insure appropriate hand placement and sequencing, but no assist needed  Ambulation/Gait Ambulation/Gait assistance: Min guard Ambulation Distance (Feet): 60 Feet Assistive device: Rolling walker (2 wheeled)       General Gait Details: Pt did well with ambulation showing very little hesitation, good ability to take even steps with consistent cadence and good tolerance with L LE WBing and minimal walker need.  Overall pt did very well for first post-op ambulation attempt  Stairs            Wheelchair Mobility    Modified Rankin (Stroke Patients Only)       Balance                                             Pertinent Vitals/Pain Pain Assessment: No/denies pain (Pt has some pain with ROM and WBing, but  only minimal)    Home Living Family/patient expects to be discharged to:: Private residence Living Arrangements: Spouse/significant other Available Help at Discharge: Family Type of Home: House Home Access: Stairs to enter Entrance Stairs-Rails: Left;Right (wide) Secretary/administrator of Steps: 3 Home Layout: Laundry or work area in basement Home Equipment: Environmental consultant - 4 wheels      Prior Function Level of Independence: Independent         Comments: Pt has been having more pain recently, but is able to stay active and has been doing some prehabbing     Hand Dominance        Extremity/Trunk Assessment   Upper Extremity Assessment Upper Extremity Assessment: Overall WFL for tasks assessed    Lower Extremity Assessment Lower Extremity Assessment: Overall WFL for tasks assessed (expected L LE post-op weakness, but generally still 4-/5)       Communication   Communication: No difficulties  Cognition Arousal/Alertness: Awake/alert Behavior During Therapy: WFL for tasks assessed/performed Overall Cognitive Status: Within Functional Limits for tasks assessed  General Comments      Exercises Total Joint Exercises Ankle Circles/Pumps: Strengthening;10 reps Quad Sets: Strengthening;10 reps Gluteal Sets: Strengthening;10 reps Short Arc Quad: Strengthening;10 reps Heel Slides: Strengthening;10 reps Hip ABduction/ADduction: Strengthening;10 reps Straight Leg Raises: AROM;10 reps Knee Flexion: PROM;5 reps Goniometric ROM: 0-92 (82* AROM)   Assessment/Plan    PT Assessment Patient needs continued PT services  PT Problem List Decreased strength;Decreased range of motion;Decreased activity tolerance;Decreased balance;Decreased mobility;Decreased knowledge of use of DME;Decreased safety awareness       PT Treatment Interventions DME instruction;Gait training;Stair training;Functional mobility training;Therapeutic  activities;Therapeutic exercise;Balance training;Cognitive remediation;Patient/family education    PT Goals (Current goals can be found in the Care Plan section)  Acute Rehab PT Goals Patient Stated Goal: go home PT Goal Formulation: With patient Time For Goal Achievement: 12/09/16 Potential to Achieve Goals: Good    Frequency BID   Barriers to discharge        Co-evaluation               AM-PAC PT "6 Clicks" Daily Activity  Outcome Measure Difficulty turning over in bed (including adjusting bedclothes, sheets and blankets)?: None Difficulty moving from lying on back to sitting on the side of the bed? : None Difficulty sitting down on and standing up from a chair with arms (e.g., wheelchair, bedside commode, etc,.)?: None Help needed moving to and from a bed to chair (including a wheelchair)?: None Help needed walking in hospital room?: A Little Help needed climbing 3-5 steps with a railing? : A Little 6 Click Score: 22    End of Session Equipment Utilized During Treatment: Gait belt Activity Tolerance: Patient tolerated treatment well Patient left: with chair alarm set;with call bell/phone within reach;with family/visitor present Nurse Communication: Mobility status PT Visit Diagnosis: Muscle weakness (generalized) (M62.81);Difficulty in walking, not elsewhere classified (R26.2)    Time: 8295-6213 PT Time Calculation (min) (ACUTE ONLY): 36 min   Charges:   PT Evaluation $PT Eval Low Complexity: 1 Low PT Treatments $Gait Training: 8-22 mins $Therapeutic Exercise: 8-22 mins   PT G Codes:        Malachi Pro, DPT 11/25/2016, 10:35 AM

## 2016-11-25 NOTE — Progress Notes (Signed)
Physical Therapy Treatment Patient Details Name: Hannah Tucker MRN: [id number] DOB: 1940/09/22 Today's Date: 11/25/2016    History of Present Illness 76 y/o female s/p L TKA 9/24.    PT Comments    Pt did well with ambulation and steps, she was consistent, safe and showed good relative confidence.  She did have some minimal increased pain with prolonged ambulation and PROM activities, though she still stayed below 5/10 t/o the session.  Pt's vitals were stable and appropriate the entire time and ultimately she is doing really well POD1.     Follow Up Recommendations  Home health PT     Equipment Recommendations  Rolling walker with 5" wheels    Recommendations for Other Services       Precautions / Restrictions Precautions Precautions: Knee Restrictions Weight Bearing Restrictions: Yes LLE Weight Bearing: Weight bearing as tolerated    Mobility  Bed Mobility               General bed mobility comments: Pt in recliner on arrival, returned to recliner  Transfers Overall transfer level: Independent Equipment used: Rolling walker (2 wheeled)             General transfer comment: again minimal cuing for hand placement and control, no physical assist  Ambulation/Gait Ambulation/Gait assistance: Min guard Ambulation Distance (Feet): 300 Feet Assistive device: Rolling walker (2 wheeled)       General Gait Details: Pt again with very little limp/hesitation with ambulation.  She was not overly reliant on the walker and generally did well. She was able to maintain consistent cadence and movement with walker.  Overall pt showed great effort, had minimal fatigue and good speed.     Stairs Stairs: Yes   Stair Management: Sideways;One rail Right Number of Stairs: 4 General stair comments: Pt was able to negotiate steps well and w/o safety issues.  She did need increase cuing but did not need phyiscal assist  Wheelchair Mobility    Modified Rankin (Stroke  Patients Only)       Balance Overall balance assessment: No apparent balance deficits (not formally assessed)                                          Cognition Arousal/Alertness: Awake/alert Behavior During Therapy: WFL for tasks assessed/performed Overall Cognitive Status: Within Functional Limits for tasks assessed                                        Exercises Total Joint Exercises Ankle Circles/Pumps: Strengthening;10 reps Quad Sets: Strengthening;10 reps Gluteal Sets: Strengthening;10 reps Heel Slides: Strengthening;10 reps Hip ABduction/ADduction: Strengthening;10 reps Straight Leg Raises: Strengthening;10 reps Long Arc Quad: Strengthening;10 reps Knee Flexion: PROM;5 reps    General Comments        Pertinent Vitals/Pain Pain Assessment: 0-10 Pain Score: 2  Pain Location: L knee    Home Living                      Prior Function            PT Goals (current goals can now be found in the care plan section) Progress towards PT goals: Progressing toward goals    Frequency    BID      PT Plan Current  plan remains appropriate    Co-evaluation              AM-PAC PT "6 Clicks" Daily Activity  Outcome Measure  Difficulty turning over in bed (including adjusting bedclothes, sheets and blankets)?: None Difficulty moving from lying on back to sitting on the side of the bed? : None Difficulty sitting down on and standing up from a chair with arms (e.g., wheelchair, bedside commode, etc,.)?: None Help needed moving to and from a bed to chair (including a wheelchair)?: None Help needed walking in hospital room?: A Little Help needed climbing 3-5 steps with a railing? : A Little 6 Click Score: 22    End of Session Equipment Utilized During Treatment: Gait belt Activity Tolerance: Patient tolerated treatment well Patient left: with chair alarm set;with call bell/phone within reach;with family/visitor  present   PT Visit Diagnosis: Muscle weakness (generalized) (M62.81);Difficulty in walking, not elsewhere classified (R26.2)     Time: 1610-9604 PT Time Calculation (min) (ACUTE ONLY): 30 min  Charges:  $Gait Training: 8-22 mins $Therapeutic Exercise: 8-22 mins                    G Codes:       Malachi Pro, DPT 11/25/2016, 4:09 PM

## 2016-11-25 NOTE — Progress Notes (Signed)
   Subjective: 1 Day Post-Op Procedure(s) (LRB): COMPUTER ASSISTED TOTAL KNEE ARTHROPLASTY (Left) Patient reports pain as mild.   Patient is well, and has had no acute complaints or problems We will start therapy today.  Plan is to go Home after hospital stay. no nausea and no vomiting Patient denies any chest pains or shortness of breath. Objective: Vital signs in last 24 hours: Temp:  [97.2 F (36.2 C)-98.6 F (37 C)] 97.7 F (36.5 C) (09/25 0715) Pulse Rate:  [50-80] 54 (09/25 0715) Resp:  [10-21] 16 (09/25 0403) BP: (101-177)/(46-85) 125/49 (09/25 0715) SpO2:  [94 %-100 %] 98 % (09/25 0715) Weight:  [79.4 kg (175 lb)] 79.4 kg (175 lb) (09/24 1753) Heels are non tender and elevated off the bed using rolled towels Intake/Output from previous day: 09/24 0701 - 09/25 0700 In: 2151.7 [I.V.:1711.7; IV Piggyback:410] Out: 2355 [Urine:2125; Drains:180; Blood:50] Intake/Output this shift: No intake/output data recorded.   Recent Labs  11/25/16 0500  HGB 12.0    Recent Labs  11/25/16 0500  WBC 8.2  RBC 4.36  HCT 35.1  PLT 226    Recent Labs  11/25/16 0500  NA 135  K 3.9  CL 100*  CO2 28  BUN 15  CREATININE 0.71  GLUCOSE 141*  CALCIUM 8.2*   No results for input(s): LABPT, INR in the last 72 hours.  EXAM General - Patient is Alert, Appropriate and Oriented Extremity - Neurologically intact Neurovascular intact Sensation intact distally Intact pulses distally Dorsiflexion/Plantar flexion intact Compartment soft Dressing - dressing C/D/I Motor Function - intact, moving foot and toes well on exam. Able to do SLR on own  Past Medical History:  Diagnosis Date  . Arthritis   . Complication of anesthesia    nausea  . Dyspnea    with exertion  . Family history of adverse reaction to anesthesia    PONV  . Hypertension   . Hypothyroidism   . PONV (postoperative nausea and vomiting)   . Stress incontinence     Assessment/Plan: 1 Day Post-Op  Procedure(s) (LRB): COMPUTER ASSISTED TOTAL KNEE ARTHROPLASTY (Left) Active Problems:   S/P total knee arthroplasty  Estimated body mass index is 32.01 kg/m as calculated from the following:   Height as of this encounter:  (1.575 m).   Weight as of this encounter: 79.4 kg (175 lb). Advance diet Up with therapy D/C IV fluids Plan for discharge tomorrow Discharge home with home health  Labs: reviewed DVT Prophylaxis - Lovenox, Foot Pumps and TED hose Weight-Bearing as tolerated to left leg D/C O2 and Pulse OX and try on Room Air Begin working on a bowel movement  Deston Bilyeu R. [facility] PA [facility] 11/25/2016, 7:52 AM

## 2016-11-25 NOTE — Care Management (Signed)
Bedside commode and rolling walker has been delivered.

## 2016-11-26 LAB — BASIC METABOLIC PANEL
Anion gap: 5 (ref 5–15)
BUN: 24 mg/dL — AB (ref 6–20)
CALCIUM: 8.3 mg/dL — AB (ref 8.9–10.3)
CO2: 30 mmol/L (ref 22–32)
Chloride: 95 mmol/L — ABNORMAL LOW (ref 101–111)
Creatinine, Ser: 0.99 mg/dL (ref 0.44–1.00)
GFR calc Af Amer: >60 mL/min (ref >60)
GFR, EST NON AFRICAN AMERICAN: 54 mL/min — AB (ref >60)
GLUCOSE: 113 mg/dL — AB (ref 65–99)
Potassium: 3.3 mmol/L — ABNORMAL LOW (ref 3.5–5.1)
SODIUM: 130 mmol/L — AB (ref 135–145)

## 2016-11-26 LAB — CBC
HCT: 32.9 % — ABNORMAL LOW (ref 35.0–47.0)
Hemoglobin: 11.3 g/dL — ABNORMAL LOW (ref 12.0–16.0)
MCH: 27.5 pg (ref 26.0–34.0)
MCHC: 34.2 g/dL (ref 32.0–36.0)
MCV: 80.3 fL (ref 80.0–100.0)
PLATELETS: 231 10*3/uL (ref 150–440)
RBC: 4.1 MIL/uL (ref 3.80–5.20)
RDW: 14.3 % (ref 11.5–14.5)
WBC: 7.3 10*3/uL (ref 3.6–11.0)

## 2016-11-26 MED ORDER — HYDROXYZINE HCL 25 MG PO TABS
25.0000 mg | ORAL_TABLET | Freq: Three times a day (TID) | ORAL | Status: DC | PRN
  Administered 2016-11-26: 25 mg via ORAL
  Filled 2016-11-26 (×2): qty 1

## 2016-11-26 MED ORDER — LACTULOSE 10 GM/15ML PO SOLN: 10.0000 g | Freq: Two times a day (BID) | ORAL | Status: DC | PRN

## 2016-11-26 NOTE — Progress Notes (Signed)
   Subjective: 2 Days Post-Op Procedure(s) (LRB): COMPUTER ASSISTED TOTAL KNEE ARTHROPLASTY (Left) Patient reports pain as mild.   Patient is well, and has had no acute complaints or problems Patient did great with physical therapy yesterday. Plan is to go Home after hospital stay. no nausea and no vomiting Patient denies any chest pains or shortness of breath. Objective: Vital signs in last 24 hours: Temp:  [97.4 F (36.3 C)-97.7 F (36.5 C)] 97.7 F (36.5 C) (09/25 1956) Pulse Rate:  [52-63] 63 (09/25 1956) Resp:  [18-19] 19 (09/25 1956) BP: (115-120)/(48-56) 120/56 (09/25 1956) SpO2:  [96 %] 96 % (09/25 1956) well approximated incision Heels are non tender and elevated off the bed using rolled towels Intake/Output from previous day: 09/25 0701 - 09/26 0700 In: 840 [P.O.:840] Out: 130 [Drains:130] Intake/Output this shift: No intake/output data recorded.   Recent Labs  11/25/16 0500 11/26/16 0443  HGB 12.0 11.3*    Recent Labs  11/25/16 0500 11/26/16 0443  WBC 8.2 7.3  RBC 4.36 4.10  HCT 35.1 32.9*  PLT 226 231    Recent Labs  11/25/16 0500 11/26/16 0443  NA 135 130*  K 3.9 3.3*  CL 100* 95*  CO2 28 30  BUN 15 24*  CREATININE 0.71 0.99  GLUCOSE 141* 113*  CALCIUM 8.2* 8.3*   No results for input(s): LABPT, INR in the last 72 hours.  EXAM General - Patient is Alert, Appropriate and Oriented Extremity - Neurologically intact Neurovascular intact Sensation intact distally Intact pulses distally Dorsiflexion/Plantar flexion intact No cellulitis present Compartment soft Dressing - scant drainage Motor Function - intact, moving foot and toes well on exam.    Past Medical History:  Diagnosis Date  . Arthritis   . Complication of anesthesia    nausea  . Dyspnea    with exertion  . Family history of adverse reaction to anesthesia    PONV  . Hypertension   . Hypothyroidism   . PONV (postoperative nausea and vomiting)   . Stress  incontinence     Assessment/Plan: 2 Days Post-Op Procedure(s) (LRB): COMPUTER ASSISTED TOTAL KNEE ARTHROPLASTY (Left) Active Problems:   S/P total knee arthroplasty  Estimated body mass index is 32.01 kg/m as calculated from the following:   Height as of this encounter:  (1.575 m).   Weight as of this encounter: 79.4 kg (175 lb). Up with therapy Discharge home with home health  Labs: Were reviewed DVT Prophylaxis - Lovenox, Foot Pumps and TED hose Weight-Bearing as tolerated to left leg Hemovac discontinued on today's visit Please wash the operative leg and change dressing prior to patient being discharged Please give the patient 2 extra honeycomb dressings to take home. Patient needs to have a bowel movement prior to being discharged. Also will need to do physical therapy this morning. TED stockings are to be applied to both legs prior to being discharged  Tennyson Kallen R. [facility] PA [facility] 11/26/2016, 7:21 AM

## 2016-11-26 NOTE — Progress Notes (Signed)
Patient have been given discharge instructions along with her husband. Both of then verbalized understanding. Patient has no complaints or SS of distress.

## 2016-11-26 NOTE — Progress Notes (Signed)
OT Cancellation Note  Patient Details Name: Hannah Tucker MRN: [id number] DOB: January 15, 1941   Cancelled Treatment:    Reason Eval/Treat Not Completed: Patient declined, no reason specified. Pt in bed with spouse in room upon attempt to treat. Pt verbalizing understanding of all previous education/training provided. Pt eager to work with PT in order to discharge home with spouse today. No additional OT needs at this time. Will sign off.  Richrd Prime, MPH, MS, OTR/L ascom [phone] 11/26/16, 9:37 AM

## 2016-11-26 NOTE — Discharge Instructions (Signed)
°Instructions after Total Knee Replacement ° ° James P. Hooten, Jr., M.D.    ° Dept. of Orthopaedics & Sports Medicine ° [facility] ° [address] ° [place], [place]  [address] ° Phone: [phone]   Fax: [phone] ° °  °DIET: °• Drink plenty of non-alcoholic fluids. °• Resume your normal diet. Include foods high in fiber. ° °ACTIVITY:  °• You may use crutches or a walker with weight-bearing as tolerated, unless instructed otherwise. °• You may be weaned off of the walker or crutches by your Physical Therapist.  °• Do NOT place pillows under the knee. Anything placed under the knee could limit your ability to straighten the knee.   °• Continue doing gentle exercises. Exercising will reduce the pain and swelling, increase motion, and prevent muscle weakness.   °• Please continue to use the TED compression stockings for 6 weeks. You may remove the stockings at night, but should reapply them in the morning. °• Do not drive or operate any equipment until instructed. ° °WOUND CARE:  °• Continue to use the PolarCare or ice packs periodically to reduce pain and swelling. °• You may bathe or shower after the staples are removed at the first office visit following surgery. ° °MEDICATIONS: °• You may resume your regular medications. °• Please take the pain medication as prescribed on the medication. °• Do not take pain medication on an empty stomach. °• You have been given a prescription for a blood thinner (Lovenox or Coumadin). Please take the medication as instructed. (NOTE: After completing a 2 week course of Lovenox, take one Enteric-coated aspirin once a day. This along with elevation will help reduce the possibility of phlebitis in your operated leg.) °• Do not drive or drink alcoholic beverages when taking pain medications. ° °CALL THE OFFICE FOR: °• Temperature above 101 degrees °• Excessive bleeding or drainage on the dressing. °• Excessive swelling, coldness, or paleness of the toes. °• Persistent  nausea and vomiting. ° °FOLLOW-UP:  °• You should have an appointment to return to the office in 10-14 days after surgery. °• Arrangements have been made for continuation of Physical Therapy (either home therapy or outpatient therapy). °  ° ° °TOTAL KNEE REPLACEMENT POSTOPERATIVE DIRECTIONS ° °Knee Rehabilitation, Guidelines Following Surgery  °Results after knee surgery are often greatly improved when you follow the exercise, range of motion and muscle strengthening exercises prescribed by your doctor. Safety measures are also important to protect the knee from further injury. Any time any of these exercises cause you to have increased pain or swelling in your knee joint, decrease the amount until you are comfortable again and slowly increase them. If you have problems or questions, call your caregiver or physical therapist for advice.  ° °HOME CARE INSTRUCTIONS  °Remove items at home which could result in a fall. This includes throw rugs or furniture in walking pathways.  °· Continue to use polar care unit on the knee for pain and swelling from surgery. You may notice swelling that will progress down to the foot and ankle.  This is normal after surgery.  Elevate the leg when you are not up walking on it.   °· Continue to use the breathing machine which will help keep your temperature down.  It is common for your temperature to cycle up and down following surgery, especially at night when you are not up moving around and exerting yourself.  The breathing machine keeps your lungs expanded and your temperature down. °· Do not   place pillow under knee, focus on keeping the knee STRAIGHT while resting ° °DIET °You may resume your previous home diet once your are discharged from the hospital. ° °DRESSING / WOUND CARE / SHOWERING °You may start showering once staples have been removed. Change the surgical dressing as needed. ° °STAPLE REMOVAL: °Staples are to be removed at two weeks post op and apply benzoin and 1/2 inch  steri strips ° °ACTIVITY °Walk with your walker as instructed. °Use walker as long as suggested by your caregivers. °Avoid periods of inactivity such as sitting longer than an hour when not asleep. This helps prevent blood clots.  °You may resume a sexual relationship in one month or when given the OK by your doctor.  °You may return to work once you are cleared by your doctor.  °Do not drive a car for 6 weeks or until released by you surgeon.  °Do not drive while taking narcotics. ° °WEIGHT BEARING °Weight bearing as tolerated with assist device (walker, cane, etc) as directed, use it as long as suggested by your surgeon or therapist, typically at least 4-6 weeks. ° °POSTOPERATIVE CONSTIPATION PROTOCOL °Constipation - defined medically as fewer than three stools per week and severe constipation as less than one stool per week. ° °One of the most common issues patients have following surgery is constipation.  Even if you have a regular bowel pattern at home, your normal regimen is likely to be disrupted due to multiple reasons following surgery.  Combination of anesthesia, postoperative narcotics, change in appetite and fluid intake all can affect your bowels.  In order to avoid complications following surgery, here are some recommendations in order to help you during your recovery period. ° °Colace (docusate) - Pick up an over-the-counter form of Colace or another stool softener and take twice a day as long as you are requiring postoperative pain medications.  Take with a full glass of water daily.  If you experience loose stools or diarrhea, hold the colace until you stool forms back up.  If your symptoms do not get better within 1 week or if they get worse, check with your doctor. ° °Dulcolax (bisacodyl) - Pick up over-the-counter and take as directed by the product packaging as needed to assist with the movement of your bowels.  Take with a full glass of water.  Use this product as needed if not relieved by  Colace only.  ° °MiraLax (polyethylene glycol) - Pick up over-the-counter to have on hand.  MiraLax is a solution that will increase the amount of water in your bowels to assist with bowel movements.  Take as directed and can mix with a glass of water, juice, soda, coffee, or tea.  Take if you go more than two days without a movement. °Do not use MiraLax more than once per day. Call your doctor if you are still constipated or irregular after using this medication for 7 days in a row. ° °If you continue to have problems with postoperative constipation, please contact the office for further assistance and recommendations.  If you experience "the worst abdominal pain ever" or develop nausea or vomiting, please contact the office immediatly for further recommendations for treatment. ° °ITCHING ° If you experience itching with your medications, try taking only a single pain pill, or even half a pain pill at a time.  You can also use Benadryl over the counter for itching or also to help with sleep.  ° °TED HOSE STOCKINGS °Wear the elastic stockings   on both legs for  6 weeks following surgery during the day but you may remove then at night for sleeping. ° °MEDICATIONS °See your medication summary on the “After Visit Summary” that the nursing staff will review with you prior to discharge.  You may have some home medications which will be placed on hold until you complete the course of blood thinner medication.  It is important for you to complete the blood thinner medication as prescribed by your surgeon.  Continue your approved medications as instructed at time of discharge. ° °PRECAUTIONS °If you experience chest pain or shortness of breath - call 911 immediately for transfer to the hospital emergency department.  °If you develop a fever greater that 101 F, purulent drainage from wound, increased redness or drainage from wound, foul odor from the wound/dressing, or calf pain - CONTACT YOUR SURGEON.   °                                                 °FOLLOW-UP APPOINTMENTS °Make sure you keep all of your appointments after your operation with your surgeon and caregivers. You should call the office at the above phone number and make an appointment for approximately two weeks after the date of your surgery or on the date instructed by your surgeon outlined in the "After Visit Summary". ° ° °RANGE OF MOTION AND STRENGTHENING EXERCISES  °Rehabilitation of the knee is important following a knee injury or an operation. After just a few days of immobilization, the muscles of the thigh which control the knee become weakened and shrink (atrophy). Knee exercises are designed to build up the tone and strength of the thigh muscles and to improve knee motion. Often times heat used for twenty to thirty minutes before working out will loosen up your tissues and help with improving the range of motion but do not use heat for the first two weeks following surgery. These exercises can be done on a training (exercise) mat, on the floor, on a table or on a bed. Use what ever works the best and is most comfortable for you Knee exercises include:  °Leg Lifts - While your knee is still immobilized in a splint or cast, you can do straight leg raises. Lift the leg to 60 degrees, hold for 3 sec, and slowly lower the leg. Repeat 10-20 times 2-3 times daily. Perform this exercise against resistance later as your knee gets better.  °Quad and Hamstring Sets - Tighten up the muscle on the front of the thigh (Quad) and hold for 5-10 sec. Repeat this 10-20 times hourly. Hamstring sets are done by pushing the foot backward against an object and holding for 5-10 sec. Repeat as with quad sets.  °· Leg Slides: Lying on your back, slowly slide your foot toward your buttocks, bending your knee up off the floor (only go as far as is comfortable). Then slowly slide your foot back down until your leg is flat on the floor again. °· Angel Wings: Lying on your back spread your  legs to the side as far apart as you can without causing discomfort.  °A rehabilitation program following serious knee injuries can speed recovery and prevent re-injury in the future due to weakened muscles. Contact your doctor or a physical therapist for more information on knee rehabilitation.  ° °IF YOU ARE TRANSFERRED TO   A SKILLED REHAB FACILITY °If the patient is transferred to a skilled rehab facility following release from the hospital, a list of the current medications will be sent to the facility for the patient to continue.  When discharged from the skilled rehab facility, please have the facility set up the patient's Home Health Physical Therapy prior to being released. Also, the skilled facility will be responsible for providing the patient with their medications at time of release from the facility to include their pain medication, the muscle relaxants, and their blood thinner medication. If the patient is still at the rehab facility at time of the two week follow up appointment, the skilled rehab facility will also need to assist the patient in arranging follow up appointment in our office and any transportation needs. ° °MAKE SURE YOU:  °Understand these instructions.  °Get help right away if you are not doing well or get worse.  ° ° ° ° °

## 2016-11-26 NOTE — Progress Notes (Signed)
Physical Therapy Treatment Patient Details Name: Hannah Tucker MRN: [id number] DOB: Jan 15, 1941 Today's Date: 11/26/2016    History of Present Illness 76 y/o female s/p L TKA 9/24.    PT Comments    Patient continues to make excellent progress in physical therapy. She is independent for bed mobility and transfers. Patient is able to ambulate 2 laps around RN station with therapist. Provided cues for increased pushoff at left terminal stance and to allow increase in left knee flexion during swing phase. Patient denies DOE with ambulation and demonstrates good speed for limited community mobility. She is once again able to safely complete stair training for therapy. Patient has met all PT goals for discharge and is safe to return home with her husband and home health physical therapy once medically stable. RN notified. [name], balance, and mobility in order to return to full function at home.    Follow Up Recommendations  Home health PT     Equipment Recommendations  Rolling walker with 5" wheels    Recommendations for Other Services       Precautions / Restrictions Precautions Precautions: Knee Precaution Booklet Issued: No Restrictions Weight Bearing Restrictions: Yes LLE Weight Bearing: Weight bearing as tolerated    Mobility  Bed Mobility Overal bed mobility: Independent             General bed mobility comments: Good speed and sequencing with bed mobility. Head of bed flat no need for bed rails  Transfers Overall transfer level: Independent Equipment used: Rolling walker (2 wheeled)             General transfer comment: Safe hand placement demonstrated. Good speed in sequencing with transfers. Good stability once upright and standing with support on walker  Ambulation/Gait Ambulation/Gait assistance: Min guard Ambulation Distance (Feet): 420 Feet Assistive device: Rolling walker (2 wheeled) Gait  Pattern/deviations: Decreased step length - right;Decreased weight shift to left Gait velocity: Functional for limited community mobility Gait velocity interpretation: <1.8 ft/sec, indicative of risk for recurrent falls General Gait Details: Patient is able to ambulate 2 laps around RN station with therapist. Provided cues for increased pushoff at left terminal stance and to allow increase in left knee flexion during swing phase. Patient denies DOE with ambulation and demonstrates good speed for limited community mobility.   Stairs Stairs: Yes   Stair Management: Sideways;One rail Left Number of Stairs: 4 General stair comments: Patient is able to complete stair training safely again today with unilateral rail and step to pattern.  Good stability with stair training today.  Wheelchair Mobility    Modified Rankin (Stroke Patients Only)       Balance Overall balance assessment: Modified Independent                                          Cognition Arousal/Alertness: Awake/alert Behavior During Therapy: WFL for tasks assessed/performed Overall Cognitive Status: Within Functional Limits for tasks assessed                                        Exercises Total Joint Exercises Ankle Circles/Pumps: Strengthening;Both;15 reps;Seated;Other (comment) ("heel raises") Hip ABduction/ADduction: Strengthening;Both;Seated;15 reps Long Arc Quad: Strengthening;Left;Seated;15 reps Knee Flexion: Strengthening;Left;Seated;20 reps Goniometric ROM: 0- 88 degrees AAROM, pain  limited in flexion Marching in Standing: Strengthening;Both;15 reps;Seated    General Comments        Pertinent Vitals/Pain Pain Assessment: 0-10 Pain Score: 2  Pain Location: L knee Pain Descriptors / Indicators: Aching Pain Intervention(s): Monitored during session;Premedicated before session    Home Living                      Prior Function            PT Goals  (current goals can now be found in the care plan section) Acute Rehab PT Goals Patient Stated Goal: go home PT Goal Formulation: With patient Time For Goal Achievement: 12/09/16 Potential to Achieve Goals: Good Progress towards PT goals: Progressing toward goals    Frequency    BID      PT Plan Current plan remains appropriate    Co-evaluation              AM-PAC PT "6 Clicks" Daily Activity  Outcome Measure  Difficulty turning over in bed (including adjusting bedclothes, sheets and blankets)?: None Difficulty moving from lying on back to sitting on the side of the bed? : None Difficulty sitting down on and standing up from a chair with arms (e.g., wheelchair, bedside commode, etc,.)?: None Help needed moving to and from a bed to chair (including a wheelchair)?: None Help needed walking in hospital room?: None Help needed climbing 3-5 steps with a railing? : A Little 6 Click Score: 23    End of Session Equipment Utilized During Treatment: Gait belt Activity Tolerance: Patient tolerated treatment well Patient left: with call bell/phone within reach;with family/visitor present;with SCD's reapplied;Other (comment) (towel roll under heel, polar care in place) Nurse Communication: Mobility status;Other (comment) (Ready for discharge) PT Visit Diagnosis: Muscle weakness (generalized) (M62.81);Difficulty in walking, not elsewhere classified (R26.2)     Time: 2952-8413 PT Time Calculation (min) (ACUTE ONLY): 27 min  Charges:  $Gait Training: 8-22 mins $Therapeutic Exercise: 8-22 mins                    G Codes:       Lyndel Safe Huprich PT, DPT     Huprich,Jason 11/26/2016, 10:28 AM

## 2016-11-26 NOTE — Care Management (Signed)
RNCM has notified [name] with [facility] of patient discharge. No other RNCM needs.

## 2017-06-15 ENCOUNTER — Other Ambulatory Visit: Payer: Self-pay | Admitting: Internal Medicine

## 2017-06-15 DIAGNOSIS — Z1231 Encounter for screening mammogram for malignant neoplasm of breast: Secondary | ICD-10-CM

## 2017-07-16 ENCOUNTER — Ambulatory Visit
Admission: RE | Admit: 2017-07-16 | Discharge: 2017-07-16 | Disposition: A | Discharge status: Home / Self Care | Payer: Medicare Other | Source: Ambulatory Visit | Attending: Internal Medicine | Admitting: Internal Medicine

## 2017-07-16 DIAGNOSIS — Z1231 Encounter for screening mammogram for malignant neoplasm of breast: Secondary | ICD-10-CM

## 2017-08-22 IMAGING — MR MR KNEE*L* W/O CM
6 series · 40 of 40 positions shown · non-contrast
Comparison: 07/03/2005

CLINICAL DATA: Pain and swelling.  Medial pain.

EXAM:
MRI OF THE LEFT KNEE WITHOUT CONTRAST
TECHNIQUE: Multiplanar, multisequence MR imaging of the knee was performed. No
intravenous contrast was administered.

[Series 3: PD fat-sat · axial · 3.0mm · 0.62mm/px · z∈[-64,+48]mm · 8 of 35 slices shown (1 of 4)]
[im 1/35]
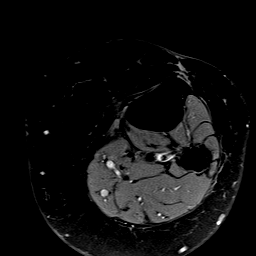
[im 5/35]
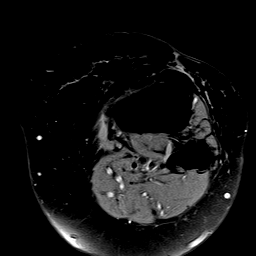
[im 10/35]
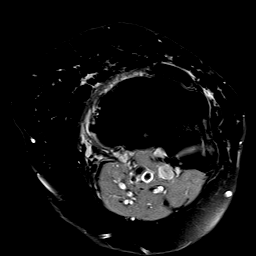
[im 15/35]
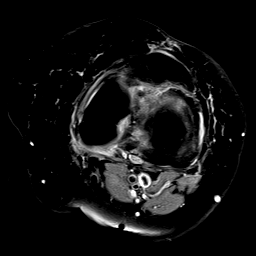
[im 20/35]
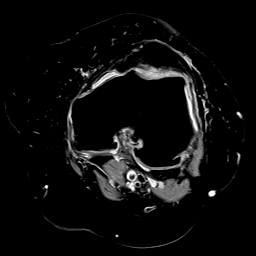
[im 25/35]
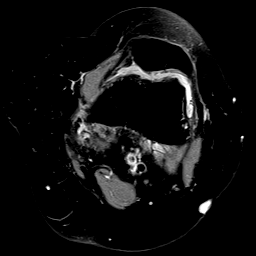
[im 30/35]
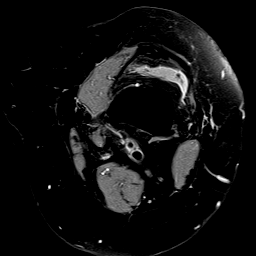
[im 35/35]
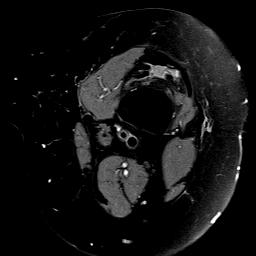

[Series 4: T1 · coronal · 3.0mm · 0.50mm/px · 7 of 30 slices shown]
[im 1/30]
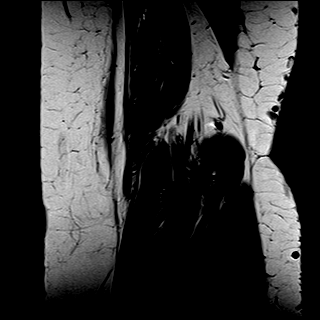
[im 5/30]
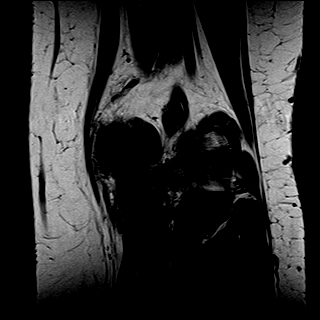
[im 10/30]
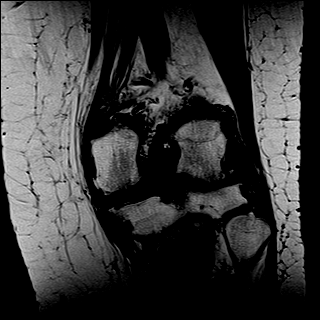
[im 15/30]
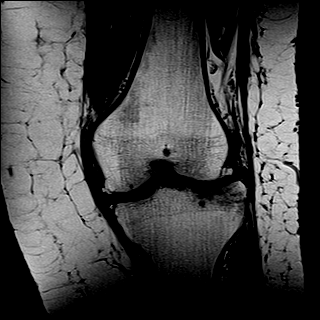
[im 20/30]
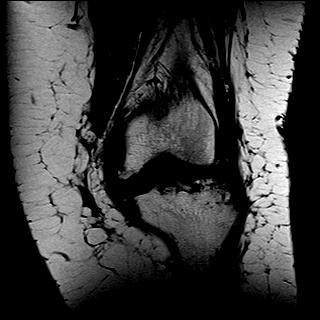
[im 25/30]
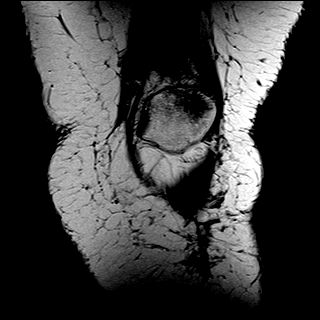
[im 30/30]
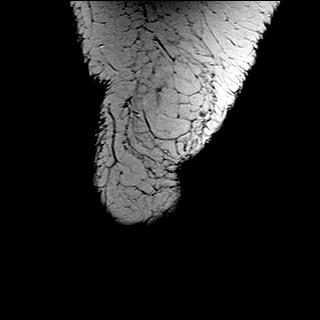

[Series 5: T2 fat-sat · coronal · 3.0mm · 0.31mm/px · 7 of 30 slices shown]
[im 1/30]
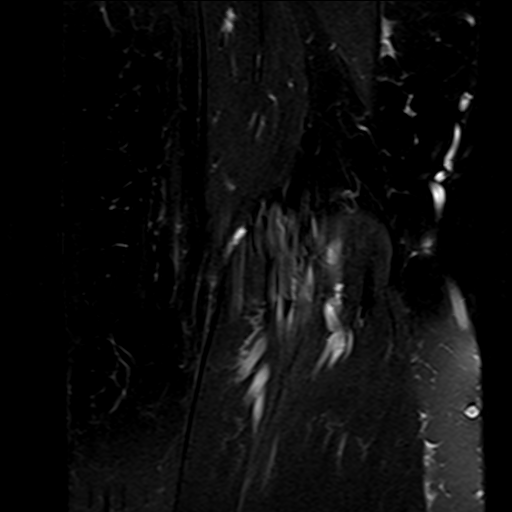
[im 5/30]
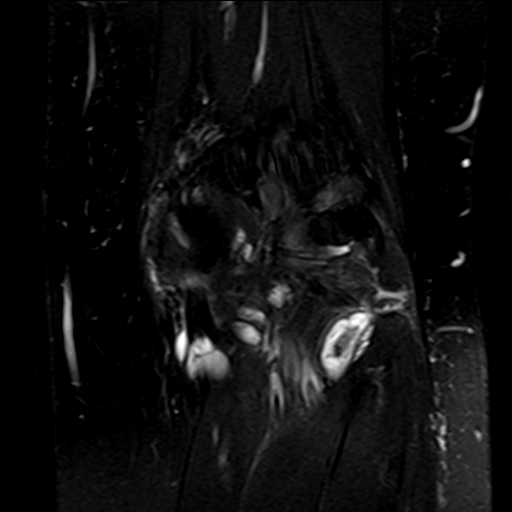
[im 10/30]
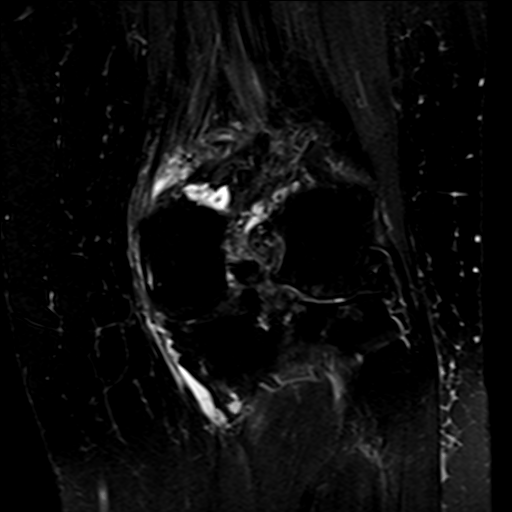
[im 15/30]
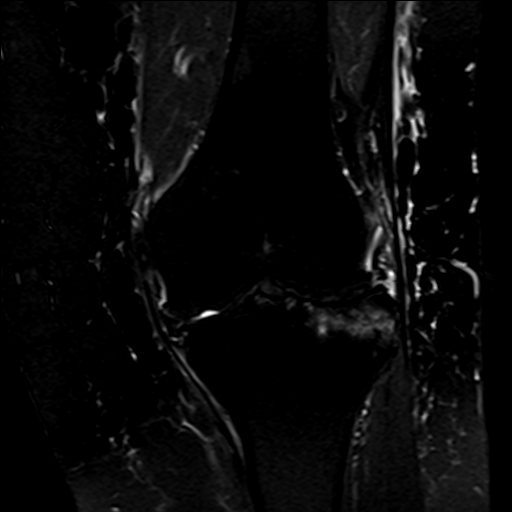
[im 20/30]
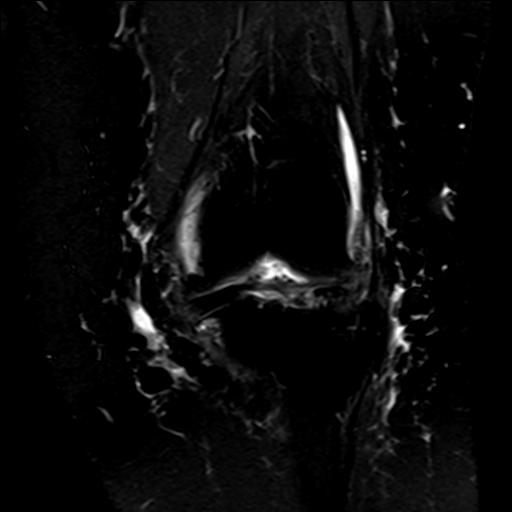
[im 25/30]
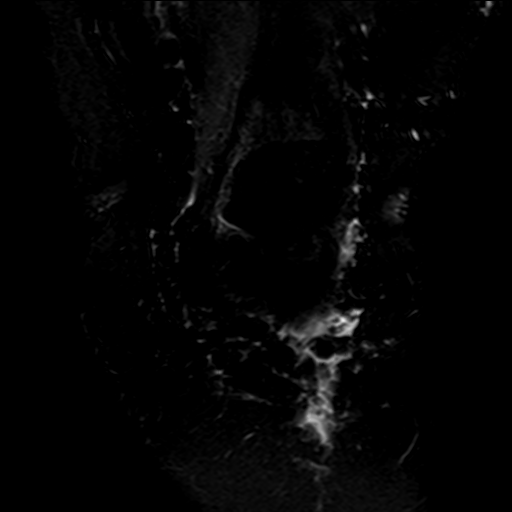
[im 30/30]
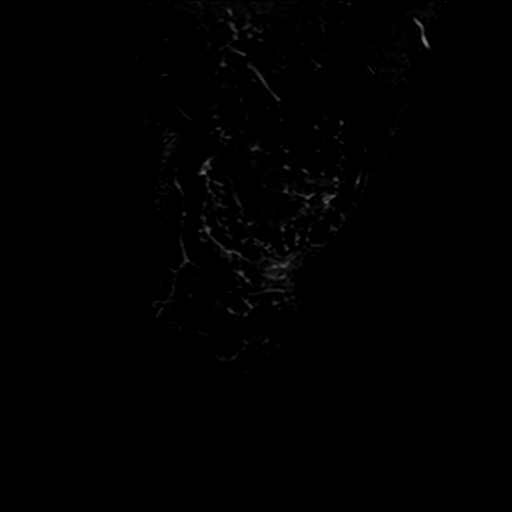

[Series 6: PD fat-sat · coronal · 3.0mm · 0.62mm/px · 7 of 30 slices shown (2 of 4)]
[im 1/30]
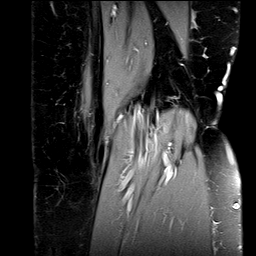
[im 5/30]
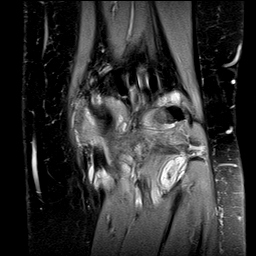
[im 10/30]
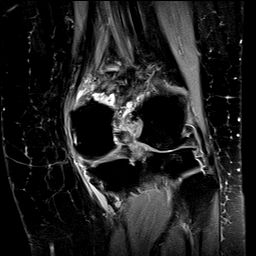
[im 15/30]
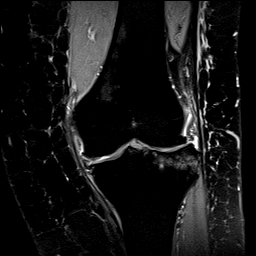
[im 20/30]
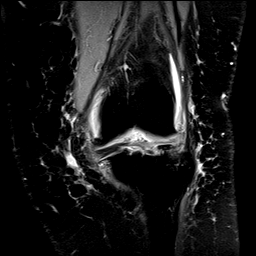
[im 25/30]
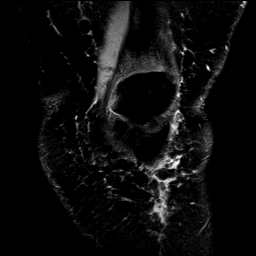
[im 30/30]
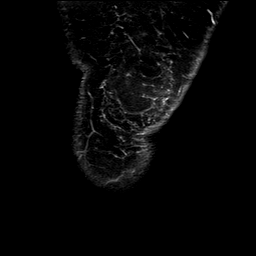

[Series 7: PD fat-sat · sagittal · 3.0mm · 0.62mm/px · 8 of 32 slices shown (3 of 4)]
[im 1/32]
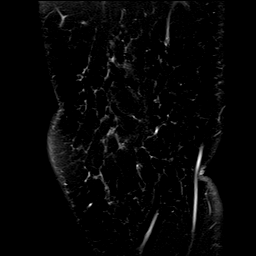
[im 5/32]
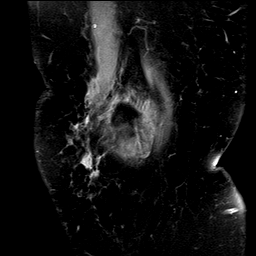
[im 9/32]
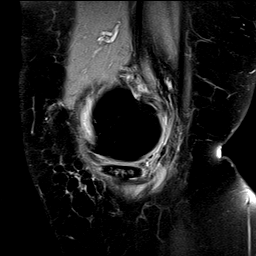
[im 14/32]
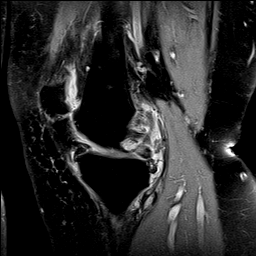
[im 18/32]
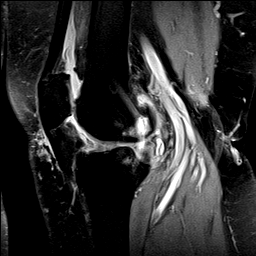
[im 23/32]
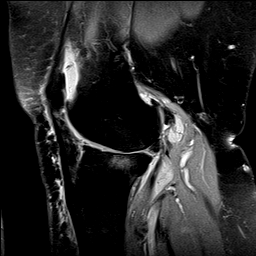
[im 27/32]
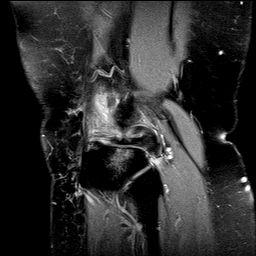
[im 32/32]
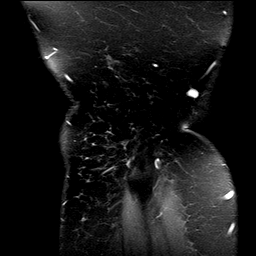

[Series 8: PD fat-sat · oblique · 2.0mm · 0.62mm/px · 3 of 13 slices shown (4 of 4)]
[im 1/13]
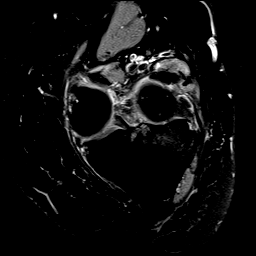
[im 7/13]
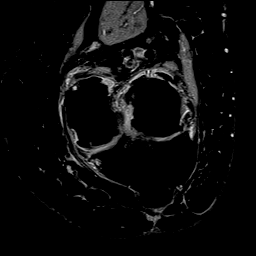
[im 13/13]
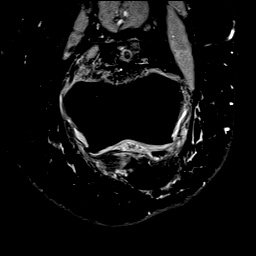

[40 of 40 positions shown; findings below may reference images not displayed]

FINDINGS: MENISCI

Medial meniscus: Maceration of the body and posterior horn of the
medial meniscus. Small oblique tear of the anterior horn of the
medial meniscus extending to the inferior articular surface.

Lateral meniscus: Maceration of the body and anterior horn of the
lateral meniscus.

LIGAMENTS

Cruciates: Intact PCL. Severely attenuated ACL likely reflecting a
near complete versus complete chronic ACL tear.

Collaterals: Medial collateral ligament is intact. Lateral
collateral ligament complex is intact.

CARTILAGE

Patellofemoral: High-grade partial-thickness cartilage loss of the
patellofemoral compartment.

Medial: High-grade partial-thickness cartilage loss with areas of
full-thickness cartilage loss of the lateral femoral condyle and
lateral tibial plateau with severe subchondral reactive marrow edema
in the medial tibial plateau and to lesser extent medial femoral
condyle.

Lateral: Extensive full-thickness cartilage loss of the lateral
femoral condyle and lateral tibial plateau.

Joint: Moderate joint effusion. Normal Hoffa's fat. No plical
thickening.

Popliteal Fossa:  No Baker cyst.  Intact popliteus tendon.

Extensor Mechanism:  Intact.

Bones: No focal marrow signal abnormality. No acute fracture or
dislocation.
IMPRESSION: 1. Tricompartmental cartilage abnormalities most severe in the
medial femorotibial compartment.
2. Maceration of the body and posterior horn of the medial meniscus.
Small oblique tear of the anterior horn of the medial meniscus
extending to the inferior articular surface.
3. Maceration of the body and anterior horn of the lateral meniscus.
4. Near complete versus complete chronic ACL tear.

## 2018-04-12 DIAGNOSIS — Z8249 Family history of ischemic heart disease and other diseases of the circulatory system: Secondary | ICD-10-CM | POA: Insufficient documentation

## 2019-04-25 ENCOUNTER — Other Ambulatory Visit: Payer: Self-pay | Admitting: Internal Medicine

## 2019-04-25 DIAGNOSIS — Z1231 Encounter for screening mammogram for malignant neoplasm of breast: Secondary | ICD-10-CM

## 2019-05-23 ENCOUNTER — Ambulatory Visit
Admission: RE | Admit: 2019-05-23 | Discharge: 2019-05-23 | Disposition: A | Discharge status: Home / Self Care | Payer: Medicare Other | Source: Ambulatory Visit | Attending: Internal Medicine | Admitting: Internal Medicine

## 2019-05-23 DIAGNOSIS — Z1231 Encounter for screening mammogram for malignant neoplasm of breast: Secondary | ICD-10-CM | POA: Diagnosis not present

## 2020-12-04 LAB — EXTERNAL GENERIC LAB PROCEDURE: COLOGUARD: NEGATIVE

## 2020-12-24 ENCOUNTER — Other Ambulatory Visit: Payer: Self-pay

## 2020-12-24 ENCOUNTER — Other Ambulatory Visit
Admission: RE | Admit: 2020-12-24 | Discharge: 2020-12-24 | Disposition: A | Discharge status: Home / Self Care | Payer: Medicare Other | Source: Ambulatory Visit | Attending: Orthopedic Surgery | Admitting: Orthopedic Surgery

## 2020-12-24 DIAGNOSIS — Z01818 Encounter for other preprocedural examination: Secondary | ICD-10-CM | POA: Diagnosis present

## 2020-12-24 DIAGNOSIS — M1711 Unilateral primary osteoarthritis, right knee: Secondary | ICD-10-CM | POA: Insufficient documentation

## 2020-12-24 DIAGNOSIS — E039 Hypothyroidism, unspecified: Secondary | ICD-10-CM | POA: Insufficient documentation

## 2020-12-24 DIAGNOSIS — R609 Edema, unspecified: Secondary | ICD-10-CM

## 2020-12-24 HISTORY — DX: Sleep apnea, unspecified: G47.30

## 2020-12-24 LAB — URINALYSIS, ROUTINE W REFLEX MICROSCOPIC
Bacteria, UA: NONE SEEN
Bilirubin Urine: NEGATIVE
Glucose, UA: NEGATIVE mg/dL
Hgb urine dipstick: NEGATIVE
Ketones, ur: NEGATIVE mg/dL
Nitrite: NEGATIVE
Protein, ur: NEGATIVE mg/dL
Specific Gravity, Urine: 1.002 — ABNORMAL LOW (ref 1.005–1.030)
pH: 6 (ref 5.0–8.0)

## 2020-12-24 LAB — C-REACTIVE PROTEIN: CRP: 0.9 mg/dL (ref <1.0)

## 2020-12-24 LAB — CBC
HCT: 43.2 % (ref 36.0–46.0)
Hemoglobin: 14 g/dL (ref 12.0–15.0)
MCH: 26.2 pg (ref 26.0–34.0)
MCHC: 32.4 g/dL (ref 30.0–36.0)
MCV: 80.9 fL (ref 80.0–100.0)
Platelets: 292 10*3/uL (ref 150–400)
RBC: 5.34 MIL/uL — ABNORMAL HIGH (ref 3.87–5.11)
RDW: 14.1 % (ref 11.5–15.5)
WBC: 5.7 10*3/uL (ref 4.0–10.5)
nRBC: 0 % (ref 0.0–0.2)

## 2020-12-24 LAB — COMPREHENSIVE METABOLIC PANEL
ALT: 14 U/L (ref 0–44)
AST: 16 U/L (ref 15–41)
Albumin: 3.9 g/dL (ref 3.5–5.0)
Alkaline Phosphatase: 42 U/L (ref 38–126)
Anion gap: 7 (ref 5–15)
BUN: 19 mg/dL (ref 8–23)
CO2: 28 mmol/L (ref 22–32)
Calcium: 9.4 mg/dL (ref 8.9–10.3)
Chloride: 102 mmol/L (ref 98–111)
Creatinine, Ser: 0.71 mg/dL (ref 0.44–1.00)
GFR, Estimated: >60 mL/min (ref >60)
Glucose, Bld: 97 mg/dL (ref 70–99)
Potassium: 3.3 mmol/L — ABNORMAL LOW (ref 3.5–5.1)
Sodium: 137 mmol/L (ref 135–145)
Total Bilirubin: 0.8 mg/dL (ref 0.3–1.2)
Total Protein: 7.5 g/dL (ref 6.5–8.1)

## 2020-12-24 LAB — SEDIMENTATION RATE: Sed Rate: 13 mm/hr (ref 0–30)

## 2020-12-24 LAB — TYPE AND SCREEN
ABO/RH(D): A NEG
Antibody Screen: NEGATIVE

## 2020-12-24 LAB — PROTIME-INR
INR: 1 (ref 0.8–1.2)
Prothrombin Time: 13.4 seconds (ref 11.4–15.2)

## 2020-12-24 LAB — SURGICAL PCR SCREEN
MRSA, PCR: NEGATIVE
Staphylococcus aureus: NEGATIVE

## 2020-12-24 NOTE — Discharge Instructions (Signed)
Instructions after Total Knee Replacement   Hannah Tucker, Jr., M.D.     Dept. of Orthopaedics & Sports Medicine  [facility]  [address]  [place], [place]  [address]  Phone: [phone]   Fax: [phone]    DIET: Drink plenty of non-alcoholic fluids. Resume your normal diet. Include foods high in fiber.  ACTIVITY:  You may use crutches or a walker with weight-bearing as tolerated, unless instructed otherwise. You may be weaned off of the walker or crutches by your Physical Therapist.  Do NOT place pillows under the knee. Anything placed under the knee could limit your ability to straighten the knee.   Continue doing gentle exercises. Exercising will reduce the pain and swelling, increase motion, and prevent muscle weakness.   Please continue to use the TED compression stockings for 6 weeks. You may remove the stockings at night, but should reapply them in the morning. Do not drive or operate any equipment until instructed.  WOUND CARE:  Continue to use the PolarCare or ice packs periodically to reduce pain and swelling. You may bathe or shower after the staples are removed at the first office visit following surgery.  MEDICATIONS: You may resume your regular medications. Please take the pain medication as prescribed on the medication. Do not take pain medication on an empty stomach. You have been given a prescription for a blood thinner (Lovenox or Coumadin). Please take the medication as instructed. (NOTE: After completing a 2 week course of Lovenox, take one Enteric-coated aspirin once a day. This along with elevation will help reduce the possibility of phlebitis in your operated leg.) Do not drive or drink alcoholic beverages when taking pain medications.  CALL THE OFFICE FOR: Temperature above 101 degrees Excessive bleeding or drainage on the dressing. Excessive swelling, coldness, or paleness of the toes. Persistent nausea and vomiting.  FOLLOW-UP:  You  should have an appointment to return to the office in 10-14 days after surgery. Arrangements have been made for continuation of Physical Therapy (either home therapy or outpatient therapy).   [facility] Department Directory         [email or web address]       [email or web address]          Cardiology  Appointments: [place] - [phone] [place] - [phone]  Endocrinology  Appointments: [place] - [phone] [place] - [phone]  Gastroenterology  Appointments: [place] - [phone] [place] - [phone]        General Surgery   Appointments: [place] - [phone]  Internal Medicine/Family Medicine  Appointments: [place] - [phone] [place] - [phone] [place] - [phone]  Metabolic and Weigh Loss Surgery  Appointments: [place] - [phone]        Neurology  Appointments: [place] - [phone] [place] - [phone]  Neurosurgery  Appointments: [place] - [phone]  Obstetrics & Gynecology  Appointments: [place] - [phone] [place] - [phone]        Pediatrics  Appointments: [place] - [phone] [place] - [phone]  Physiatry  Appointments: [place] -[phone]  Physical Therapy  Appointments: [place] - [phone] [place] - [phone]        Podiatry  Appointments: [place] - [phone] [place] - [phone]  Pulmonology  Appointments: [place] - [phone]  Rheumatology  Appointments: [place] - [phone]        [place] Location: [facility]  [address] [place], [place]  [address]  [place] Location: [facility] [address] [place], [place]  [address]  [place] Location: [facility] [address] [place], [place]  [address]    

## 2020-12-24 NOTE — Patient Instructions (Addendum)
Your procedure is scheduled on: 12/31/20  Report to the Registration Desk on the 1st floor of [facility]. To find out your arrival time, please call [phone] between 1PM - 3PM on: 12/28/20  Report to [facility] for Covid test on 12/27/20 at 8:20 am.  REMEMBER: Instructions that are not followed completely may result in serious medical risk, up to and including death; or upon the discretion of your surgeon and anesthesiologist your surgery may need to be rescheduled.  Do not eat food after midnight the night before surgery.  No gum chewing, lozengers or hard candies.  You may however, drink CLEAR liquids up to 2 hours before you are scheduled to arrive for your surgery. Do not drink anything within 2 hours of your scheduled arrival time.  Clear liquids include: - water  - apple juice without pulp - gatorade (not RED, PURPLE, OR BLUE) - black coffee or tea (Do NOT add milk or creamers to the coffee or tea) Do NOT drink anything that is not on this list.  In addition, your doctor has ordered for you to drink the provided  Ensure Pre-Surgery Clear Carbohydrate Drink  Drinking this carbohydrate drink up to two hours before surgery helps to reduce insulin resistance and improve patient outcomes. Please complete drinking 2 hours prior to scheduled arrival time.  TAKE THESE MEDICATIONS THE MORNING OF SURGERY WITH A SIP OF WATER:  - levothyroxine (SYNTHROID, LEVOTHROID) 88 MCG tablet  One week prior to surgery: Stop Anti-inflammatories (NSAIDS) such as Advil, Aleve, Ibuprofen, Motrin, Naproxen, Naprosyn and Aspirin based products such as Excedrin, Goodys Powder, BC Powder.  Stop ANY OVER THE COUNTER supplements until after surgery.  You may however, continue to take Tylenol if needed for pain up until the day of surgery.  No Alcohol for 24 hours before or after surgery.  No Smoking including e-cigarettes for 24 hours prior to surgery.  No chewable tobacco products  for at least 6 hours prior to surgery.  No nicotine patches on the day of surgery.  Do not use any "recreational" drugs for at least a week prior to your surgery.  Please be advised that the combination of cocaine and anesthesia may have negative outcomes, up to and including death. If you test positive for cocaine, your surgery will be cancelled.  On the morning of surgery brush your teeth with toothpaste and water, you may rinse your mouth with mouthwash if you wish. Do not swallow any toothpaste or mouthwash.  Use CHG Soap or wipes as directed on instruction sheet.  Do not wear jewelry, make-up, hairpins, clips or nail polish.  Do not wear lotions, powders, or perfumes.   Do not shave body from the neck down 48 hours prior to surgery just in case you cut yourself which could leave a site for infection.  Also, freshly shaved skin may become irritated if using the CHG soap.  Contact lenses, hearing aids and dentures may not be worn into surgery.  Do not bring valuables to the hospital. [facility] is not responsible for any missing/lost belongings or valuables.   Notify your doctor if there is any change in your medical condition (cold, fever, infection).  Wear comfortable clothing (specific to your surgery type) to the hospital.  After surgery, you can help prevent lung complications by doing breathing exercises.  Take deep breaths and cough every 1-2 hours. Your doctor may order a device called an Incentive Spirometer to help you take deep breaths. When coughing  or sneezing, hold a pillow firmly against your incision with both hands. This is called "splinting." Doing this helps protect your incision. It also decreases belly discomfort.  If you are being admitted to the hospital overnight, leave your suitcase in the car. After surgery it may be brought to your room.  If you are being discharged the day of surgery, you will not be allowed to drive home. You will need a responsible  adult (18 years or older) to drive you home and stay with you that night.   If you are taking public transportation, you will need to have a responsible adult (18 years or older) with you. Please confirm with your physician that it is acceptable to use public transportation.   Please call the Pre-admissions Testing Dept. at [phone] if you have any questions about these instructions.  Surgery Visitation Policy:  Patients undergoing a surgery or procedure may have one family member or support person with them as long as that person is not COVID-19 positive or experiencing its symptoms.  That person may remain in the waiting area during the procedure and may rotate out with other people.  Inpatient Visitation:    Visiting hours are 7 a.m. to 8 p.m. Up to two visitors ages 16+ are allowed at one time in a patient room. The visitors may rotate out with other people during the day. Visitors must check out when they leave, or other visitors will not be allowed. One designated support person may remain overnight. The visitor must pass COVID-19 screenings, use hand sanitizer when entering and exiting the patient's room and wear a mask at all times, including in the patient's room. Patients must also wear a mask when staff or their visitor are in the room. Masking is required regardless of vaccination status.

## 2020-12-27 ENCOUNTER — Other Ambulatory Visit: Payer: Self-pay

## 2020-12-27 ENCOUNTER — Other Ambulatory Visit
Admission: RE | Admit: 2020-12-27 | Discharge: 2020-12-27 | Disposition: A | Discharge status: Home / Self Care | Payer: Medicare Other | Source: Ambulatory Visit | Attending: Orthopedic Surgery | Admitting: Orthopedic Surgery

## 2020-12-27 DIAGNOSIS — Z01812 Encounter for preprocedural laboratory examination: Secondary | ICD-10-CM | POA: Diagnosis present

## 2020-12-27 DIAGNOSIS — Z20822 Contact with and (suspected) exposure to covid-19: Secondary | ICD-10-CM | POA: Diagnosis not present

## 2020-12-28 LAB — SARS CORONAVIRUS 2 (TAT 6-24 HRS): SARS Coronavirus 2: NEGATIVE

## 2020-12-31 ENCOUNTER — Other Ambulatory Visit: Payer: Self-pay

## 2020-12-31 ENCOUNTER — Ambulatory Visit: Payer: Medicare Other | Admitting: Urgent Care

## 2020-12-31 ENCOUNTER — Encounter: Payer: Self-pay | Admitting: Orthopedic Surgery

## 2020-12-31 ENCOUNTER — Ambulatory Visit: Payer: Medicare Other

## 2020-12-31 ENCOUNTER — Encounter
Admission: RE | Disposition: A | Discharge status: Home / Self Care | Payer: Self-pay | Source: Ambulatory Visit | Attending: Orthopedic Surgery

## 2020-12-31 ENCOUNTER — Ambulatory Visit
Admission: RE | Admit: 2020-12-31 | Discharge: 2021-01-01 | Disposition: A | Discharge status: Home / Self Care | Payer: Medicare Other | Source: Ambulatory Visit | Attending: Orthopedic Surgery | Admitting: Orthopedic Surgery

## 2020-12-31 DIAGNOSIS — E039 Hypothyroidism, unspecified: Secondary | ICD-10-CM

## 2020-12-31 DIAGNOSIS — Z888 Allergy status to other drugs, medicaments and biological substances status: Secondary | ICD-10-CM | POA: Diagnosis not present

## 2020-12-31 DIAGNOSIS — Z79890 Hormone replacement therapy (postmenopausal): Secondary | ICD-10-CM | POA: Insufficient documentation

## 2020-12-31 DIAGNOSIS — M1711 Unilateral primary osteoarthritis, right knee: Secondary | ICD-10-CM | POA: Insufficient documentation

## 2020-12-31 DIAGNOSIS — Z96652 Presence of left artificial knee joint: Secondary | ICD-10-CM | POA: Insufficient documentation

## 2020-12-31 DIAGNOSIS — Z881 Allergy status to other antibiotic agents status: Secondary | ICD-10-CM | POA: Insufficient documentation

## 2020-12-31 DIAGNOSIS — M25761 Osteophyte, right knee: Secondary | ICD-10-CM | POA: Insufficient documentation

## 2020-12-31 DIAGNOSIS — Z79899 Other long term (current) drug therapy: Secondary | ICD-10-CM | POA: Insufficient documentation

## 2020-12-31 DIAGNOSIS — Z96659 Presence of unspecified artificial knee joint: Secondary | ICD-10-CM | POA: Diagnosis present

## 2020-12-31 DIAGNOSIS — R609 Edema, unspecified: Secondary | ICD-10-CM

## 2020-12-31 HISTORY — PX: KNEE ARTHROPLASTY: SHX992

## 2020-12-31 SURGERY — ARTHROPLASTY, KNEE, TOTAL, USING IMAGELESS COMPUTER-ASSISTED NAVIGATION
Anesthesia: Spinal | Site: Knee | Laterality: Right

## 2020-12-31 MED ORDER — SENNOSIDES-DOCUSATE SODIUM 8.6-50 MG PO TABS
1.0000 | ORAL_TABLET | Freq: Two times a day (BID) | ORAL | Status: DC
  Administered 2021-01-01: 1 via ORAL
  Filled 2020-12-31 (×3): qty 1

## 2020-12-31 MED ORDER — ALUM & MAG HYDROXIDE-SIMETH 200-200-20 MG/5ML PO SUSP: 30.0000 mL | ORAL | Status: DC | PRN

## 2020-12-31 MED ORDER — BUPIVACAINE HCL (PF) 0.5 % IJ SOLN
INTRAMUSCULAR | Status: DC | PRN
  Administered 2020-12-31: 3 mL

## 2020-12-31 MED ORDER — MENTHOL 3 MG MT LOZG
1.0000 | LOZENGE | OROMUCOSAL | Status: DC | PRN
  Filled 2020-12-31: qty 9

## 2020-12-31 MED ORDER — CEFAZOLIN SODIUM-DEXTROSE 2-4 GM/100ML-% IV SOLN
INTRAVENOUS | Status: AC
  Administered 2021-01-01: 2 g via INTRAVENOUS
  Filled 2020-12-31: qty 100

## 2020-12-31 MED ORDER — APREPITANT 40 MG PO CAPS: 40.0000 mg | ORAL_CAPSULE | Freq: Once | ORAL | Status: AC

## 2020-12-31 MED ORDER — TRANEXAMIC ACID-NACL 1000-0.7 MG/100ML-% IV SOLN: 1000.0000 mg | Freq: Once | INTRAVENOUS | Status: DC

## 2020-12-31 MED ORDER — APREPITANT 40 MG PO CAPS
ORAL_CAPSULE | ORAL | Status: AC
  Administered 2020-12-31: 40 mg via ORAL
  Filled 2020-12-31: qty 1

## 2020-12-31 MED ORDER — BUPIVACAINE HCL (PF) 0.25 % IJ SOLN
INTRAMUSCULAR | Status: DC | PRN
  Administered 2020-12-31: 60 mL

## 2020-12-31 MED ORDER — FERROUS SULFATE 325 (65 FE) MG PO TABS
325.0000 mg | ORAL_TABLET | Freq: Two times a day (BID) | ORAL | Status: DC
  Filled 2020-12-31 (×2): qty 1

## 2020-12-31 MED ORDER — ASCORBIC ACID 500 MG PO TABS
1000.0000 mg | ORAL_TABLET | Freq: Two times a day (BID) | ORAL | Status: DC
  Administered 2021-01-01: 1000 mg via ORAL
  Filled 2020-12-31 (×3): qty 2

## 2020-12-31 MED ORDER — PROPOFOL 10 MG/ML IV BOLUS
INTRAVENOUS | Status: AC
  Filled 2020-12-31: qty 20

## 2020-12-31 MED ORDER — OYSTER SHELL CALCIUM/D3 500-5 MG-MCG PO TABS
1.0000 | ORAL_TABLET | Freq: Every day | ORAL | Status: DC
  Administered 2021-01-01: 1 via ORAL
  Filled 2020-12-31: qty 1

## 2020-12-31 MED ORDER — LOSARTAN POTASSIUM-HCTZ 100-12.5 MG PO TABS: 1.0000 | ORAL_TABLET | Freq: Every day | ORAL | Status: DC

## 2020-12-31 MED ORDER — LACTATED RINGERS IV SOLN
INTRAVENOUS | Status: DC
Start: 2020-12-31 — End: 2020-12-31

## 2020-12-31 MED ORDER — CHLORHEXIDINE GLUCONATE 0.12 % MT SOLN
OROMUCOSAL | Status: AC
  Administered 2020-12-31: 15 mL via OROMUCOSAL
  Filled 2020-12-31: qty 15

## 2020-12-31 MED ORDER — FLEET ENEMA 7-19 GM/118ML RE ENEM: 1.0000 | ENEMA | Freq: Once | RECTAL | Status: DC | PRN

## 2020-12-31 MED ORDER — SODIUM CHLORIDE 0.9 % IV SOLN
INTRAVENOUS | Status: DC | PRN
  Administered 2020-12-31: 60 mL

## 2020-12-31 MED ORDER — TRAMADOL HCL 50 MG PO TABS: 50.0000 mg | ORAL_TABLET | ORAL | Status: DC | PRN

## 2020-12-31 MED ORDER — PRESERVISION AREDS 2+MULTI VIT PO CAPS: 1.0000 | ORAL_CAPSULE | Freq: Two times a day (BID) | ORAL | Status: DC

## 2020-12-31 MED ORDER — GABAPENTIN 300 MG PO CAPS: 300.0000 mg | ORAL_CAPSULE | Freq: Once | ORAL | Status: AC

## 2020-12-31 MED ORDER — ONDANSETRON HCL 4 MG/2ML IJ SOLN: 4.0000 mg | Freq: Once | INTRAMUSCULAR | Status: DC | PRN

## 2020-12-31 MED ORDER — ACETAMINOPHEN 10 MG/ML IV SOLN
INTRAVENOUS | Status: DC | PRN
  Administered 2020-12-31: 1000 mg via INTRAVENOUS

## 2020-12-31 MED ORDER — FAMOTIDINE 20 MG PO TABS: 20.0000 mg | ORAL_TABLET | Freq: Once | ORAL | Status: AC

## 2020-12-31 MED ORDER — LOSARTAN POTASSIUM 50 MG PO TABS
100.0000 mg | ORAL_TABLET | Freq: Every day | ORAL | Status: DC
  Administered 2021-01-01: 100 mg via ORAL
  Filled 2020-12-31: qty 2

## 2020-12-31 MED ORDER — PROPOFOL 500 MG/50ML IV EMUL
INTRAVENOUS | Status: DC | PRN
  Administered 2020-12-31: 200 ug/kg/min via INTRAVENOUS

## 2020-12-31 MED ORDER — ACETAMINOPHEN 10 MG/ML IV SOLN
INTRAVENOUS | Status: AC
  Filled 2020-12-31: qty 100

## 2020-12-31 MED ORDER — PHENOL 1.4 % MT LIQD
1.0000 | OROMUCOSAL | Status: DC | PRN
  Filled 2020-12-31: qty 177

## 2020-12-31 MED ORDER — CELECOXIB 200 MG PO CAPS
ORAL_CAPSULE | ORAL | Status: AC
  Administered 2020-12-31: 400 mg via ORAL
  Filled 2020-12-31: qty 2

## 2020-12-31 MED ORDER — CHLORHEXIDINE GLUCONATE 0.12 % MT SOLN: 15.0000 mL | Freq: Once | OROMUCOSAL | Status: AC

## 2020-12-31 MED ORDER — ENSURE PRE-SURGERY PO LIQD
296.0000 mL | Freq: Once | ORAL | Status: AC
  Administered 2020-12-31: 296 mL via ORAL
  Filled 2020-12-31: qty 296

## 2020-12-31 MED ORDER — ONDANSETRON HCL 4 MG/2ML IJ SOLN: 4.0000 mg | Freq: Four times a day (QID) | INTRAMUSCULAR | Status: DC | PRN

## 2020-12-31 MED ORDER — TRANEXAMIC ACID-NACL 1000-0.7 MG/100ML-% IV SOLN
INTRAVENOUS | Status: AC
  Administered 2020-12-31: 1000 mg via INTRAVENOUS
  Filled 2020-12-31: qty 100

## 2020-12-31 MED ORDER — TRANEXAMIC ACID-NACL 1000-0.7 MG/100ML-% IV SOLN
1000.0000 mg | INTRAVENOUS | Status: AC
  Administered 2020-12-31: 1000 mg via INTRAVENOUS

## 2020-12-31 MED ORDER — PANTOPRAZOLE SODIUM 40 MG PO TBEC
40.0000 mg | DELAYED_RELEASE_TABLET | Freq: Two times a day (BID) | ORAL | Status: DC
  Administered 2021-01-01: 40 mg via ORAL
  Filled 2020-12-31 (×3): qty 1

## 2020-12-31 MED ORDER — PHENYLEPHRINE HCL-NACL 20-0.9 MG/250ML-% IV SOLN
INTRAVENOUS | Status: DC | PRN
  Administered 2020-12-31: 25 ug/min via INTRAVENOUS

## 2020-12-31 MED ORDER — FAMOTIDINE 20 MG PO TABS
ORAL_TABLET | ORAL | Status: AC
  Administered 2020-12-31: 20 mg via ORAL
  Filled 2020-12-31: qty 1

## 2020-12-31 MED ORDER — AMLODIPINE BESYLATE 5 MG PO TABS
5.0000 mg | ORAL_TABLET | Freq: Every day | ORAL | Status: DC
  Filled 2020-12-31 (×2): qty 1

## 2020-12-31 MED ORDER — METOCLOPRAMIDE HCL 10 MG PO TABS: 10.0000 mg | ORAL_TABLET | Freq: Three times a day (TID) | ORAL | Status: DC

## 2020-12-31 MED ORDER — LEVOTHYROXINE SODIUM 88 MCG PO TABS
88.0000 ug | ORAL_TABLET | Freq: Every day | ORAL | Status: DC
  Administered 2021-01-01: 88 ug via ORAL
  Filled 2020-12-31: qty 1

## 2020-12-31 MED ORDER — CEFAZOLIN SODIUM-DEXTROSE 2-4 GM/100ML-% IV SOLN
2.0000 g | INTRAVENOUS | Status: AC
  Administered 2020-12-31: 2 g via INTRAVENOUS

## 2020-12-31 MED ORDER — DEXAMETHASONE SODIUM PHOSPHATE 10 MG/ML IJ SOLN
INTRAMUSCULAR | Status: AC
  Administered 2020-12-31: 8 mg via INTRAVENOUS
  Filled 2020-12-31: qty 1

## 2020-12-31 MED ORDER — ARTIFICIAL TEARS OPHTHALMIC OINT
TOPICAL_OINTMENT | Freq: Three times a day (TID) | OPHTHALMIC | Status: DC
  Filled 2020-12-31: qty 3.5

## 2020-12-31 MED ORDER — ONDANSETRON HCL 4 MG PO TABS: 4.0000 mg | ORAL_TABLET | Freq: Four times a day (QID) | ORAL | Status: DC | PRN

## 2020-12-31 MED ORDER — SURGIPHOR WOUND IRRIGATION SYSTEM - OPTIME
TOPICAL | Status: DC | PRN
  Administered 2020-12-31: 450 mL via TOPICAL

## 2020-12-31 MED ORDER — DEXAMETHASONE SODIUM PHOSPHATE 10 MG/ML IJ SOLN: 8.0000 mg | Freq: Once | INTRAMUSCULAR | Status: AC

## 2020-12-31 MED ORDER — OCUVITE-LUTEIN PO CAPS
1.0000 | ORAL_CAPSULE | Freq: Every day | ORAL | Status: DC
  Administered 2021-01-01: 1 via ORAL
  Filled 2020-12-31: qty 1

## 2020-12-31 MED ORDER — PROPOFOL 1000 MG/100ML IV EMUL
INTRAVENOUS | Status: AC
  Filled 2020-12-31: qty 100

## 2020-12-31 MED ORDER — CHLORHEXIDINE GLUCONATE 4 % EX LIQD
60.0000 mL | Freq: Once | CUTANEOUS | Status: AC
  Administered 2020-12-31: 4 via TOPICAL

## 2020-12-31 MED ORDER — BISACODYL 10 MG RE SUPP
10.0000 mg | Freq: Every day | RECTAL | Status: DC | PRN
  Filled 2020-12-31: qty 1

## 2020-12-31 MED ORDER — POLYETHYL GLYCOL-PROPYL GLYCOL 0.4-0.3 % OP GEL: Freq: Three times a day (TID) | OPHTHALMIC | Status: DC

## 2020-12-31 MED ORDER — SODIUM CHLORIDE 0.9 % IR SOLN
Status: DC | PRN
  Administered 2020-12-31: 3000 mL

## 2020-12-31 MED ORDER — GABAPENTIN 300 MG PO CAPS
ORAL_CAPSULE | ORAL | Status: AC
  Administered 2020-12-31: 300 mg via ORAL
  Filled 2020-12-31: qty 1

## 2020-12-31 MED ORDER — 0.9 % SODIUM CHLORIDE (POUR BTL) OPTIME
TOPICAL | Status: DC | PRN
  Administered 2020-12-31: 500 mL

## 2020-12-31 MED ORDER — TRANEXAMIC ACID-NACL 1000-0.7 MG/100ML-% IV SOLN
INTRAVENOUS | Status: AC
  Filled 2020-12-31: qty 100

## 2020-12-31 MED ORDER — CLOTRIMAZOLE 1 % EX CREA
TOPICAL_CREAM | CUTANEOUS | Status: DC | PRN
  Filled 2020-12-31: qty 15

## 2020-12-31 MED ORDER — CELECOXIB 200 MG PO CAPS: 400.0000 mg | ORAL_CAPSULE | Freq: Once | ORAL | Status: AC

## 2020-12-31 MED ORDER — TRAMADOL HCL 50 MG PO TABS
ORAL_TABLET | ORAL | Status: AC
  Administered 2020-12-31: 50 mg via ORAL
  Filled 2020-12-31: qty 1

## 2020-12-31 MED ORDER — ACETAMINOPHEN 10 MG/ML IV SOLN
1000.0000 mg | Freq: Four times a day (QID) | INTRAVENOUS | Status: DC
Start: 2021-01-01 — End: 2021-01-01

## 2020-12-31 MED ORDER — ENOXAPARIN SODIUM 30 MG/0.3ML IJ SOSY
30.0000 mg | PREFILLED_SYRINGE | Freq: Two times a day (BID) | INTRAMUSCULAR | Status: DC
  Administered 2021-01-01: 30 mg via SUBCUTANEOUS
  Filled 2020-12-31 (×2): qty 0.3

## 2020-12-31 MED ORDER — MAGNESIUM HYDROXIDE 400 MG/5ML PO SUSP: 30.0000 mL | Freq: Every day | ORAL | Status: DC

## 2020-12-31 MED ORDER — CEFAZOLIN SODIUM-DEXTROSE 2-4 GM/100ML-% IV SOLN
2.0000 g | Freq: Four times a day (QID) | INTRAVENOUS | Status: AC
Start: 2020-12-31 — End: 2021-01-01

## 2020-12-31 MED ORDER — ORAL CARE MOUTH RINSE: 15.0000 mL | Freq: Once | OROMUCOSAL | Status: AC

## 2020-12-31 MED ORDER — HYDROCHLOROTHIAZIDE 12.5 MG PO TABS
12.5000 mg | ORAL_TABLET | Freq: Every day | ORAL | Status: DC
  Administered 2021-01-01: 12.5 mg via ORAL
  Filled 2020-12-31: qty 1

## 2020-12-31 MED ORDER — FENTANYL CITRATE (PF) 100 MCG/2ML IJ SOLN: 25.0000 ug | INTRAMUSCULAR | Status: DC | PRN

## 2020-12-31 MED ORDER — PROPOFOL 10 MG/ML IV BOLUS
INTRAVENOUS | Status: DC | PRN
  Administered 2020-12-31: 20 mg via INTRAVENOUS

## 2020-12-31 MED ORDER — NEOMYCIN-POLYMYXIN B GU 40-200000 IR SOLN
Status: DC | PRN
  Administered 2020-12-31: 14 mL

## 2020-12-31 MED ORDER — SODIUM CHLORIDE 0.9 % IV SOLN: INTRAVENOUS | Status: DC

## 2020-12-31 SURGICAL SUPPLY — 79 items
ATTUNE MED DOME PAT 32 KNEE (Knees) ×1 IMPLANT
ATTUNE PS FEM RT SZ 3 CEM KNEE (Femur) ×1 IMPLANT
ATTUNE PSRP INSR SZ3 8 KNEE (Insert) ×1 IMPLANT
BASE TIBIAL ROT PLAT SZ 3 KNEE (Knees) IMPLANT
BATTERY INSTRU NAVIGATION (MISCELLANEOUS) ×8 IMPLANT
BLADE SAW 70X12.5 (BLADE) ×2 IMPLANT
BLADE SAW 90X13X1.19 OSCILLAT (BLADE) ×2 IMPLANT
BLADE SAW 90X25X1.19 OSCILLAT (BLADE) ×2 IMPLANT
BONE CEMENT GENTAMICIN (Cement) ×4 IMPLANT
BSPLAT TIB 3 CMNT ROT PLAT STR (Knees) ×1 IMPLANT
BTRY SRG DRVR LF (MISCELLANEOUS) ×4
CEMENT BONE GENTAMICIN 40 (Cement) IMPLANT
COOLER POLAR GLACIER W/PUMP (MISCELLANEOUS) ×2 IMPLANT
CUFF TOURN SGL QUICK 24 (TOURNIQUET CUFF)
CUFF TOURN SGL QUICK 34 (TOURNIQUET CUFF)
CUFF TRNQT CYL 24X4X16.5-23 (TOURNIQUET CUFF) IMPLANT
CUFF TRNQT CYL 34X4.125X (TOURNIQUET CUFF) IMPLANT
DRAPE 3/4 80X56 (DRAPES) ×2 IMPLANT
DRAPE INCISE IOBAN 66X45 STRL (DRAPES) ×2 IMPLANT
DRSG DERMACEA 8X12 NADH (GAUZE/BANDAGES/DRESSINGS) ×2 IMPLANT
DRSG MEPILEX SACRM 8.7X9.8 (GAUZE/BANDAGES/DRESSINGS) ×2 IMPLANT
DRSG OPSITE POSTOP 4X12 (GAUZE/BANDAGES/DRESSINGS) ×2 IMPLANT
DRSG OPSITE POSTOP 4X14 (GAUZE/BANDAGES/DRESSINGS) ×2 IMPLANT
DRSG TEGADERM 4X4.75 (GAUZE/BANDAGES/DRESSINGS) ×2 IMPLANT
DURAPREP 26ML APPLICATOR (WOUND CARE) ×4 IMPLANT
ELECT CAUTERY BLADE 6.4 (BLADE) ×2 IMPLANT
ELECT REM PT RETURN 9FT ADLT (ELECTROSURGICAL) ×2
ELECTRODE REM PT RTRN 9FT ADLT (ELECTROSURGICAL) ×1 IMPLANT
EX-PIN ORTHOLOCK NAV 4X150 (PIN) ×4 IMPLANT
GLOVE SRG 8 PF TXTR STRL LF DI (GLOVE) ×1 IMPLANT
GLOVE SURG ENC TEXT LTX SZ7.5 (GLOVE) ×4 IMPLANT
GLOVE SURG UNDER POLY LF SZ7.5 (GLOVE) ×8 IMPLANT
GLOVE SURG UNDER POLY LF SZ8 (GLOVE) ×2
GOWN STRL REUS W/ TWL LRG LVL3 (GOWN DISPOSABLE) ×2 IMPLANT
GOWN STRL REUS W/ TWL XL LVL3 (GOWN DISPOSABLE) ×1 IMPLANT
GOWN STRL REUS W/TWL LRG LVL3 (GOWN DISPOSABLE) ×4
GOWN STRL REUS W/TWL XL LVL3 (GOWN DISPOSABLE) ×2
HEMOVAC 400CC 10FR (MISCELLANEOUS) ×2 IMPLANT
HOLDER FOLEY CATH W/STRAP (MISCELLANEOUS) ×2 IMPLANT
IRRIGATION SURGIPHOR STRL (IV SOLUTION) ×2 IMPLANT
IV NS IRRIG 3000ML ARTHROMATIC (IV SOLUTION) ×2 IMPLANT
KIT TURNOVER KIT A (KITS) ×2 IMPLANT
KNIFE SCULPS 14X20 (INSTRUMENTS) ×2 IMPLANT
LABEL OR SOLS (LABEL) ×2 IMPLANT
MANIFOLD NEPTUNE II (INSTRUMENTS) ×4 IMPLANT
NDL SAFETY ECLIPSE 18X1.5 (NEEDLE) ×1 IMPLANT
NDL SPNL 20GX3.5 QUINCKE YW (NEEDLE) ×2 IMPLANT
NEEDLE HYPO 18GX1.5 SHARP (NEEDLE) ×2
NEEDLE SPNL 20GX3.5 QUINCKE YW (NEEDLE) ×4 IMPLANT
NS IRRIG 500ML POUR BTL (IV SOLUTION) ×2 IMPLANT
PACK TOTAL KNEE (MISCELLANEOUS) ×2 IMPLANT
PAD ABD DERMACEA PRESS 5X9 (GAUZE/BANDAGES/DRESSINGS) ×4 IMPLANT
PAD WRAPON POLAR KNEE (MISCELLANEOUS) ×1 IMPLANT
PENCIL SMOKE EVACUATOR COATED (MISCELLANEOUS) ×2 IMPLANT
PIN DRILL FIX HALF THREAD (BIT) ×4 IMPLANT
PIN DRILL QUICK PACK ×3 IMPLANT
PIN FIXATION 1/8DIA X 3INL (PIN) ×2 IMPLANT
PULSAVAC PLUS IRRIG FAN TIP (DISPOSABLE) ×2
SOL PREP PVP 2OZ (MISCELLANEOUS) ×2
SOLUTION PREP PVP 2OZ (MISCELLANEOUS) ×1 IMPLANT
SPONGE DRAIN TRACH 4X4 STRL 2S (GAUZE/BANDAGES/DRESSINGS) ×2 IMPLANT
SPONGE T-LAP 18X18 ~~LOC~~+RFID (SPONGE) ×7 IMPLANT
STAPLER SKIN PROX 35W (STAPLE) ×2 IMPLANT
STOCKINETTE IMPERV 14X48 (MISCELLANEOUS) ×1 IMPLANT
STRAP TIBIA SHORT (MISCELLANEOUS) ×2 IMPLANT
SUCTION FRAZIER HANDLE 10FR (MISCELLANEOUS) ×1
SUCTION TUBE FRAZIER 10FR DISP (MISCELLANEOUS) ×1 IMPLANT
SUT VIC AB 0 CT1 36 (SUTURE) ×4 IMPLANT
SUT VIC AB 1 CT1 36 (SUTURE) ×4 IMPLANT
SUT VIC AB 2-0 CT2 27 (SUTURE) ×2 IMPLANT
SYR 20ML LL LF (SYRINGE) ×2 IMPLANT
SYR 30ML LL (SYRINGE) ×4 IMPLANT
TIBIAL BASE ROT PLAT SZ 3 KNEE (Knees) ×2 IMPLANT
TIP FAN IRRIG PULSAVAC PLUS (DISPOSABLE) ×1 IMPLANT
TOWEL OR 17X26 4PK STRL BLUE (TOWEL DISPOSABLE) ×2 IMPLANT
TOWER CARTRIDGE SMART MIX (DISPOSABLE) ×2 IMPLANT
TRAY FOLEY MTR SLVR 16FR STAT (SET/KITS/TRAYS/PACK) ×2 IMPLANT
WATER STERILE IRR 500ML POUR (IV SOLUTION) ×2 IMPLANT
WRAPON POLAR PAD KNEE (MISCELLANEOUS) ×2

## 2020-12-31 NOTE — H&P (Signed)
ORTHOPAEDIC HISTORY & PHYSICAL Gwenlyn Fudge, [place] - 12/27/2020 8:45 AM EDT Formatting of this note is different from the original. [place] Chief Complaint:   Chief Complaint  Patient presents with   Knee Pain  H & P RIGHT KNEE   History of Present Illness:   Hannah Tucker is a 80 y.o. female that presents to clinic today for her preoperative history and evaluation. Patient presents unaccompanied. The patient is scheduled to undergo a right total knee arthroplasty on 12/31/20 by Dr. Marry Guan. Her pain began several years ago. The pain is located primarily along the medial aspect of the knee. She describes her pain as worse with weightbearing as well as going up and down stairs. She reports associated swelling with some giving way of the knee. She denies associated numbness or tingling, denies locking.   The patient's symptoms have progressed to the point that they decrease her quality of life. The patient has previously undergone conservative treatment including NSAIDS and injections to the knee without adequate control of her symptoms.  Of note, patient does report allergy to Vicryl suture.  Patient denies history of blood clots, significant cardiac history, or lumbar surgery.  Past Medical, Surgical, Family, Social History, Allergies, Medications:   Past Medical History:  Past Medical History:  Diagnosis Date   Chickenpox   Hypertension   Mitral valve prolapse   OSA on CPAP 01/30/2014   Thyroid disease  Hypothyroidism   Past Surgical History:  Past Surgical History:  Procedure Laterality Date   COLONOSCOPY 02/17/2014, 02/13/2009  PH Adenomatous Polyp: CBF 02/2019   COLONOSCOPY 10/07/2005  Adenomatous Polyp   Cyst removal from both breasts Bilateral   DILATION AND CURETTAGE, DIAGNOSTIC / THERAPEUTIC   EGD 02/17/2014  No repeat per RTE   Left knee arthroscopy 07/15/2002, 05/27/2005  Dr. Marry Guan   Left knee arthroscopy,  partial medial and lateral meniscectomies, and chondroplasty 07/18/2015  Dr Marry Guan   Left total knee arthroplasty using computer-assisted navigation 11/24/2016  Dr Marry Guan   Current Medications:  Current Outpatient Medications  Medication Sig Dispense Refill   acetaminophen (TYLENOL) 325 MG tablet Take 325 mg by mouth every 6 (six) hours as needed for Pain   amLODIPine (NORVASC) 5 MG tablet Take 1 tablet (5 mg total) by mouth at bedtime 90 tablet 3   amoxicillin (AMOXIL) 500 MG capsule TK FOUR CS PO 1 HOUR B DAPP   antiox #8/om3/dha/epa/lut/zeax (PRESERVISION AREDS 2, OMEGA-3, ORAL) Take 1 caplet by mouth 2 (two) times daily   ascorbic acid, vitamin C, (VITAMIN C) 500 MG tablet Take 500 mg by mouth once daily   biotin 10 mg Tab Take 1 tablet by mouth once daily   calcium carbonate 600 mg calcium (1,500 mg) Tab tablet Take 600 mg by mouth once daily.   clotrimazole-betamethasone (LOTRISONE) 1-0.05 % cream Apply topically   diclofenac (VOLTAREN) 1 % topical gel Apply 2 g topically once daily as needed   levothyroxine (SYNTHROID) 88 MCG tablet Take 1 tablet (88 mcg total) by mouth every morning before breakfast ON AN EMPTY STOMACH WITH A GLASS OF WATER AT LEAST 30 TO 60 MINUTES BEFORE BREAKFAS 90 tablet 3   losartan-hydrochlorothiazide (HYZAAR) 100-12.5 mg tablet Take 1 tablet by mouth once daily 90 tablet 1   peg 400-propylene glycol, PF, (SYSTANE ULTRA) 0.4-0.3 % ophthalmic drops Place 1 drop into both eyes as needed for Dry Eyes   No current facility-administered medications for this visit.   Allergies:  Allergies  Allergen Reactions   Biaxin [Clarithromycin] Other (See Comments)  Does not remember.   Ciprofloxacin Muscle Pain   Diphenhydramine Hcl Other (See Comments)  Pt unsure what reaction is but says she has not taken this in years because of a reaction   Social History:  Social History   Socioeconomic History   Marital status: Married  Spouse name: Gwyndolyn Saxon   Number of  children: 3   Years of education: 24  Occupational History   Occupation: Retired  Tobacco Use   Smoking status: Never Smoker   Smokeless tobacco: Never Used  Scientific laboratory technician Use: Never used  Substance and Sexual Activity   Alcohol use: No  Alcohol/week: 0.0 standard drinks   Drug use: No   Sexual activity: Yes  Partners: Male   Family History:  Family History  Problem Relation Age of Onset   Heart disease Mother   High blood pressure (Hypertension) Father   Stroke Father   Diabetes Father   Arthritis Sister   High blood pressure (Hypertension) Sister   Arthritis Brother   Liver cancer Paternal Grandmother   Diabetes type I Son   No Known Problems Daughter   Review of Systems:   A 10+ ROS was performed, reviewed, and the pertinent orthopaedic findings are documented in the HPI.   Physical Examination:   BP (!) 140/80 (BP Location: Left upper arm, Patient Position: Sitting, BP Cuff Size: Large Adult)  Ht 154.9 cm (5\' 1" )  Wt 81.2 kg (179 lb)  BMI 33.82 kg/m   Patient is a well-developed, well-nourished female in no acute distress. Patient has normal mood and affect. Patient is alert and oriented to person, place, and time.   HEENT: Atraumatic, normocephalic. Pupils equal and reactive to light. Extraocular motion intact. Noninjected sclera.  Cardiovascular: Regular rate and rhythm, with no murmurs, rubs, or gallops. Distal pulses palpable. No carotid bruits.  Respiratory: Lungs clear to auscultation bilaterally.   Right Knee: Soft tissue swelling: minimal Effusion: none Erythema: none Crepitance: minimal Tenderness: medial Alignment: relative varus Mediolateral laxity: medial pseudolaxity Posterior sag: negative Patellar tracking: Good tracking without evidence of subluxation or tilt Atrophy: No significant atrophy.  Quadriceps tone was fair to good. Range of motion: 0/5/115 degrees  Sensation intact over the saphenous, lateral sural cutaneous,  superficial fibular, and deep fibular nerve distributions.  Tests Performed/Reviewed:  X-rays  3 views of the right knee were obtained. Images reveal moderate loss of medial compartment joint space with osteophyte formation. 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 No fractures or dislocations noted. No other osseous abnormality noted.  Personally ordered and interpreted today's x-rays.  Impression:   ICD-10-CM  1. Primary osteoarthritis of right knee M17.11   Plan:   The patient has end-stage degenerative changes of the right knee. It was explained to the patient that the condition is progressive in nature. Having failed conservative treatment, the patient has elected to proceed with a total joint arthroplasty. The patient will undergo a total joint arthroplasty with Dr. Marry Guan. The risks of surgery, including blood clot and infection, were discussed with the patient. Measures to reduce these risks, including the use of anticoagulation, perioperative antibiotics, and early ambulation were discussed. The importance of postoperative physical therapy was discussed with the patient. The patient elects to proceed with surgery. The patient is instructed to stop all blood thinners prior to surgery. The patient is instructed to call the hospital the day before surgery to learn of the proper arrival  time.   Contact our office with any questions or concerns. Follow up as indicated, or sooner should any new problems arise, if conditions worsen, or if they are otherwise concerned.   Michelene Gardener, PA-C [facility] [address] [place], [place] [address] Phone: [phone]  This note was generated in part with voice recognition software and I apologize for any typographical errors that were not detected and corrected.  Electronically signed by Michelene Gardener, PA at 12/27/2020 9:14 AM EDT

## 2020-12-31 NOTE — Op Note (Addendum)
OPERATIVE NOTE  DATE OF SURGERY:  12/31/2020  PATIENT NAME:  Hannah Tucker   DOB: 04-May-1940  MRN: [id number]  PRE-OPERATIVE DIAGNOSIS: Degenerative arthrosis of the right knee, primary  POST-OPERATIVE DIAGNOSIS:  Same  PROCEDURE:  Right total knee arthroplasty using computer-assisted navigation  SURGEON:  Jena Gauss. M.D.  ASSISTANT: Baldwin Jamaica, PA-C (present and scrubbed throughout the case, critical for assistance with exposure, retraction, instrumentation, and closure)  ANESTHESIA: spinal  ESTIMATED BLOOD LOSS: 50 mL  FLUIDS REPLACED: 1400 mL of crystalloid  TOURNIQUET TIME: #1 - 19 minutes #2 - 24 minutes  DRAINS: 2 medium Hemovac drains  SOFT TISSUE RELEASES: Anterior cruciate ligament, posterior cruciate ligament, deep  medial collateral ligament, patellofemoral ligament  IMPLANTS UTILIZED: DePuy Attune size 3 posterior stabilized femoral component (cemented), size 3 rotating platform tibial component (cemented), 32 mm medialized dome patella (cemented), and an 8 mm stabilized rotating platform polyethylene insert.  INDICATIONS FOR SURGERY: Hannah Tucker is a 80 y.o. year old female with a long history of progressive knee pain. X-rays demonstrated severe degenerative changes in tricompartmental fashion. The patient had not seen any significant improvement despite conservative nonsurgical intervention. After discussion of the risks and benefits of surgical intervention, the patient expressed understanding of the risks benefits and agree with plans for total knee arthroplasty.   The risks, benefits, and alternatives were discussed at length including but not limited to the risks of infection, bleeding, nerve injury, stiffness, blood clots, the need for revision surgery, cardiopulmonary complications, among others, and they were willing to proceed.  PROCEDURE IN DETAIL: The patient was brought into the operating room and, after adequate spinal anesthesia was achieved,  a tourniquet was placed on the patient's upper thigh. The patient's knee and leg were cleaned and prepped with alcohol and DuraPrep and draped in the usual sterile fashion. A "timeout" was performed as per usual protocol. The lower extremity was exsanguinated using an Esmarch, and the tourniquet was inflated to 300 mmHg. An anterior longitudinal incision was made followed by a standard mid vastus approach. The deep fibers of the medial collateral ligament were elevated in a subperiosteal fashion off of the medial flare of the tibia so as to maintain a continuous soft tissue sleeve. The patella was subluxed laterally and the patellofemoral ligament was incised. Inspection of the knee demonstrated severe degenerative changes with full-thickness loss of articular cartilage. Osteophytes were debrided using a rongeur. Anterior and posterior cruciate ligaments were excised.  It was felt that there was a venous tourniquet in place and it was elected to deflate the tourniquet after initial tourniquet time of 19 minutes.  Two 4.0 mm Schanz pins were inserted in the femur and into the tibia for attachment of the array of trackers used for computer-assisted navigation. Hip center was identified using a circumduction technique. Distal landmarks were mapped using the computer. The distal femur and proximal tibia were mapped using the computer. The distal femoral cutting guide was positioned using computer-assisted navigation so as to achieve a 5 distal valgus cut. The femur was sized and it was felt that a size 3 femoral component was appropriate. A size 3 femoral cutting guide was positioned and the anterior cut was performed and verified using the computer. This was followed by completion of the posterior and chamfer cuts. Femoral cutting guide for the central box was then positioned in the center box cut was performed.  Attention was then directed to the proximal tibia. Medial and lateral menisci were excised. The  extramedullary tibial cutting guide was positioned using computer-assisted navigation so as to achieve a 0 varus-valgus alignment and 3 posterior slope. The cut was performed and verified using the computer. The proximal tibia was sized and it was felt that a size 3 tibial tray was appropriate. Tibial and femoral trials were inserted followed by insertion of an 8 mm polyethylene insert. This allowed for excellent mediolateral soft tissue balancing both in flexion and in full extension. Finally, the patella was cut and prepared so as to accommodate a 32 mm medialized dome patella. A patella trial was placed and the knee was placed through a range of motion with excellent patellar tracking appreciated.  The right lower extremity was exsanguinated again using an Esmarch and the tourniquet was inflated to 400 mmHg.  The femoral trial was removed after debridement of posterior osteophytes. The central post-hole for the tibial component was reamed followed by insertion of a keel punch. Tibial trials were then removed. Cut surfaces of bone were irrigated with copious amounts of normal saline using pulsatile lavage and then suctioned dry. Polymethylmethacrylate cement with gentamicin was prepared in the usual fashion using a vacuum mixer. Cement was applied to the cut surface of the proximal tibia as well as along the undersurface of a size 3 rotating platform tibial component. Tibial component was positioned and impacted into place. Excess cement was removed using Personal assistant. Cement was then applied to the cut surfaces of the femur as well as along the posterior flanges of the size 3 femoral component. The femoral component was positioned and impacted into place. Excess cement was removed using Personal assistant. An 8 mm polyethylene trial was inserted and the knee was brought into full extension with steady axial compression applied. Finally, cement was applied to the backside of a 32 mm medialized dome patella and  the patellar component was positioned and patellar clamp applied. Excess cement was removed using Personal assistant. After adequate curing of the cement, the tourniquet was deflated after a second tourniquet time of 24 minutes. Hemostasis was achieved using electrocautery. The knee was irrigated with copious amounts of normal saline using pulsatile lavage followed by 500 ml of Surgiphor and then suctioned dry. 20 mL of 1.3% Exparel and 60 mL of 0.25% Marcaine in 40 mL of normal saline was injected along the posterior capsule, medial and lateral gutters, and along the arthrotomy site. An 8 mm stabilized rotating platform polyethylene insert was inserted and the knee was placed through a range of motion with excellent mediolateral soft tissue balancing appreciated and excellent patellar tracking noted. 2 medium drains were placed in the wound bed and brought out through separate stab incisions. The medial parapatellar portion of the incision was reapproximated using interrupted sutures of #1 Vicryl. Subcutaneous tissue was approximated in layers using first #0 Monocryl followed #2-0 Monocryl. The skin was approximated with skin staples. A sterile dressing was applied.  The patient tolerated the procedure well and was transported to the recovery room in stable condition.    Avalene Sealy P. Angie Fava., M.D.

## 2020-12-31 NOTE — Transfer of Care (Signed)
Immediate Anesthesia Transfer of Care Note  Patient: Hannah Tucker  Procedure(s) Performed: COMPUTER ASSISTED TOTAL KNEE ARTHROPLASTY (Right: Knee)  Patient Location: PACU  Anesthesia Type:Spinal  Level of Consciousness: awake, alert  and oriented  Airway & Oxygen Therapy: Patient Spontanous Breathing and Patient connected to face mask oxygen  Post-op Assessment: Report given to RN and Post -op Vital signs reviewed and stable  Post vital signs: Reviewed and stable  Last Vitals:  Vitals Value Taken Time  BP    Temp    Pulse    Resp    SpO2      Last Pain:  Vitals:   12/31/20 0821  TempSrc: Oral  PainSc: 4          Complications: No notable events documented.

## 2020-12-31 NOTE — Progress Notes (Signed)
Patient awake/alert x4.  Spinal anesthesia, able to move bil lower ext. Pulses intact +2. Polar care on, foot pumps on bil.  Dressing to right knee c/d/I  CMS+ RLE Please note per anesthesia:  right eye draining. CRNA Junious Silk reported eye not completely covered, ointment placed in both eyes. May have drainage next few days. Anesthesia aware. Patient denies any pain to right eye.

## 2020-12-31 NOTE — Anesthesia Preprocedure Evaluation (Signed)
Anesthesia Evaluation  Patient identified by MRN, date of birth, ID band Patient awake    Reviewed: Allergy & Precautions, NPO status , Patient's Chart, lab work & pertinent test results  History of Anesthesia Complications (+) PONV and history of anesthetic complications  Airway Mallampati: II  TM Distance: >3 FB Neck ROM: Full    Dental no notable dental hx. (+) Dental Advidsory Given   Pulmonary shortness of breath and with exertion, sleep apnea and Continuous Positive Airway Pressure Ventilation , neg COPD, neg recent URI,    breath sounds clear to auscultation- rhonchi (-) wheezing      Cardiovascular hypertension, Pt. on medications (-) angina(-) CAD, (-) Past MI and (-) Cardiac Stents (-) dysrhythmias (-) Valvular Problems/Murmurs Rhythm:Regular Rate:Normal - Systolic murmurs and - Diastolic murmurs    Neuro/Psych negative neurological ROS  negative psych ROS   GI/Hepatic negative GI ROS, Neg liver ROS,   Endo/Other  neg diabetesHypothyroidism   Renal/GU negative Renal ROS     Musculoskeletal  (+) Arthritis ,   Abdominal (+) - obese,   Peds  Hematology negative hematology ROS (+)   Anesthesia Other Findings Past Medical History: No date: Arthritis No date: Complication of anesthesia     Comment:  nausea No date: Dyspnea     Comment:  with exertion No date: Family history of adverse reaction to anesthesia     Comment:  PONV No date: Hypertension No date: Hypothyroidism No date: PONV (postoperative nausea and vomiting) No date: Stress incontinence   Reproductive/Obstetrics                             Anesthesia Physical  Anesthesia Plan  ASA: 2  Anesthesia Plan: Spinal   Post-op Pain Management:    Induction: Intravenous  PONV Risk Score and Plan: 3 and Dexamethasone, Propofol infusion, Ondansetron, TIVA and Treatment may vary due to age or medical condition  Airway  Management Planned: Natural Airway and Simple Face Mask  Additional Equipment:   Intra-op Plan:   Post-operative Plan:   Informed Consent: I have reviewed the patients History and Physical, chart, labs and discussed the procedure including the risks, benefits and alternatives for the proposed anesthesia with the patient or authorized representative who has indicated his/her understanding and acceptance.     Dental advisory given  Plan Discussed with: Anesthesiologist and CRNA  Anesthesia Plan Comments:         Lab Results  Component Value Date   WBC 5.7 12/24/2020   HGB 14.0 12/24/2020   HCT 43.2 12/24/2020   MCV 80.9 12/24/2020   PLT 292 12/24/2020    Anesthesia Quick Evaluation

## 2020-12-31 NOTE — H&P (Signed)
The patient has been re-examined, and the chart reviewed, and there have been no interval changes to the documented history and physical.    The risks, benefits, and alternatives have been discussed at length. The patient expressed understanding of the risks benefits and agreed with plans for surgical intervention.  Patsye Sullivant P. Shyna Duignan, Jr. M.D.    

## 2020-12-31 NOTE — Anesthesia Procedure Notes (Signed)
Spinal  Patient location during procedure: OR Start time: 12/31/2020 3:05 PM End time: 12/31/2020 3:11 PM Reason for block: surgical anesthesia Staffing Performed: resident/CRNA  Resident/CRNA: Nelda Marseille, CRNA Preanesthetic Checklist Completed: patient identified, IV checked, site marked, risks and benefits discussed, surgical consent, monitors and equipment checked, pre-op evaluation and timeout performed Spinal Block Patient position: sitting Prep: Betadine Patient monitoring: heart rate, continuous pulse ox, blood pressure and cardiac monitor Approach: midline Location: L3-4 Injection technique: single-shot Needle Needle type: Whitacre and Introducer  Needle gauge: 25 G Needle length: 9 cm Assessment Events: CSF return Additional Notes Negative paresthesia. Negative blood return. Positive free-flowing CSF. Expiration date of kit checked and confirmed. Patient tolerated procedure well, without complications.

## 2021-01-01 ENCOUNTER — Encounter: Payer: Self-pay | Admitting: Orthopedic Surgery

## 2021-01-01 DIAGNOSIS — M1711 Unilateral primary osteoarthritis, right knee: Secondary | ICD-10-CM | POA: Diagnosis not present

## 2021-01-01 MED ORDER — ACETAMINOPHEN 10 MG/ML IV SOLN
INTRAVENOUS | Status: AC
  Administered 2021-01-01: 1000 mg via INTRAVENOUS
  Filled 2021-01-01: qty 100

## 2021-01-01 MED ORDER — OXYCODONE HCL 5 MG PO TABS: 5.0000 mg | ORAL_TABLET | ORAL | 0 refills | Status: DC | PRN

## 2021-01-01 MED ORDER — METOCLOPRAMIDE HCL 10 MG PO TABS
ORAL_TABLET | ORAL | Status: AC
  Administered 2021-01-01: 10 mg via ORAL
  Filled 2021-01-01: qty 1

## 2021-01-01 MED ORDER — CELECOXIB 200 MG PO CAPS: 200.0000 mg | ORAL_CAPSULE | Freq: Two times a day (BID) | ORAL | Status: DC

## 2021-01-01 MED ORDER — KETOROLAC TROMETHAMINE 0.5 % OP SOLN
1.0000 [drp] | Freq: Three times a day (TID) | OPHTHALMIC | Status: DC | PRN
  Administered 2021-01-01: 1 [drp] via OPHTHALMIC
  Filled 2021-01-01: qty 3

## 2021-01-01 MED ORDER — CEFAZOLIN SODIUM-DEXTROSE 2-4 GM/100ML-% IV SOLN
INTRAVENOUS | Status: AC
  Filled 2021-01-01: qty 100

## 2021-01-01 MED ORDER — MAGNESIUM HYDROXIDE 400 MG/5ML PO SUSP
ORAL | Status: AC
  Administered 2021-01-01: 30 mL via ORAL
  Filled 2021-01-01: qty 30

## 2021-01-01 MED ORDER — CELECOXIB 200 MG PO CAPS
ORAL_CAPSULE | ORAL | Status: AC
  Administered 2021-01-01: 200 mg via ORAL
  Filled 2021-01-01: qty 1

## 2021-01-01 MED ORDER — CELECOXIB 200 MG PO CAPS: 200.0000 mg | ORAL_CAPSULE | Freq: Two times a day (BID) | ORAL | 0 refills | Status: DC

## 2021-01-01 MED ORDER — HYDROMORPHONE HCL 1 MG/ML IJ SOLN: 0.5000 mg | INTRAMUSCULAR | Status: DC | PRN

## 2021-01-01 MED ORDER — TRAMADOL HCL 50 MG PO TABS: 50.0000 mg | ORAL_TABLET | ORAL | 0 refills | Status: DC | PRN

## 2021-01-01 MED ORDER — OXYCODONE HCL 5 MG PO TABS: 5.0000 mg | ORAL_TABLET | ORAL | Status: DC | PRN

## 2021-01-01 MED ORDER — OXYCODONE HCL 5 MG PO TABS
ORAL_TABLET | ORAL | Status: AC
  Filled 2021-01-01: qty 1

## 2021-01-01 MED ORDER — ENOXAPARIN SODIUM 40 MG/0.4ML IJ SOSY: 40.0000 mg | PREFILLED_SYRINGE | INTRAMUSCULAR | 0 refills | Status: DC

## 2021-01-01 MED ORDER — OXYCODONE HCL 5 MG PO TABS
ORAL_TABLET | ORAL | Status: AC
  Administered 2021-01-01: 5 mg via ORAL
  Filled 2021-01-01: qty 1

## 2021-01-01 MED ORDER — OXYCODONE HCL 5 MG PO TABS
ORAL_TABLET | ORAL | Status: AC
  Administered 2021-01-01: 5 mg via ORAL
  Filled 2021-01-01: qty 2

## 2021-01-01 MED ORDER — METAXALONE 800 MG PO TABS: 800.0000 mg | ORAL_TABLET | Freq: Three times a day (TID) | ORAL | 0 refills | Status: DC | PRN

## 2021-01-01 MED ORDER — OXYCODONE HCL 5 MG PO TABS
10.0000 mg | ORAL_TABLET | ORAL | Status: DC | PRN
Start: 2021-01-01 — End: 2021-01-01
  Administered 2021-01-01: 10 mg via ORAL

## 2021-01-01 MED ORDER — METAXALONE 800 MG PO TABS
800.0000 mg | ORAL_TABLET | Freq: Three times a day (TID) | ORAL | Status: DC | PRN
  Administered 2021-01-01: 800 mg via ORAL
  Filled 2021-01-01 (×3): qty 1

## 2021-01-01 NOTE — Progress Notes (Signed)
Patient complaining of leg spasm. This RN enters room, she is grabbing the thigh and has a grimace on her face. Offered to remove foot pumps to see if that helps. States it happens when it inflates and deflates so there is not a correlation to the foot pumps. States that this happened the last time with the other leg. Requesting something stronger for pain.

## 2021-01-01 NOTE — Progress Notes (Signed)
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    PT Comments    Pt continues to participate well with PT and shows good POD1 metrics.  She had good quad set and general post-op strength, has >90 deg AROM flexion, ambulated ~200 ft with walker and negotiate up/down steps all without direct assist and with minimal cuing.  She should be safe to return home with assist from husband and continued PT in the home setting.   Recommendations for follow up therapy are one component of a multi-disciplinary discharge planning process, led by the attending physician.  Recommendations may be updated based on patient status, additional functional criteria and insurance authorization.  Follow Up Recommendations  Follow physician's recommendations for discharge plan and follow up therapies     Assistance Recommended at Discharge None  Equipment Recommendations  None recommended by PT    Recommendations for Other Services       Precautions / Restrictions Restrictions RLE Weight Bearing: Weight bearing as tolerated     Mobility  Bed Mobility               General bed mobility comments: pt sitting in recliner at beginning/end of session    Transfers Overall transfer level: Modified independent Equipment used: Rolling walker (2 wheels) Transfers: Sit to/from Stand Sit to Stand: Supervision           General transfer comment: much less guarding with improved set up and handplacement w/o cuing    Ambulation/Gait Ambulation/Gait assistance: Supervision Gait Distance (Feet): 200 Feet Assistive device: Rolling walker (2 wheels)       General Gait Details: Pt was again quickly able to assume confident cadence and consistent walker motion w/o issue.  Vitals remain stable, no safety concerns.   Stairs      Stair Management: One rail Right;Sideways;Step to pattern Number of Stairs: 4 General stair comments: Pt was able to negotiate up/down steps w/o issue.  Expected reliance on UEs/rail but safe and confident.   Wheelchair Mobility    Modified Rankin (Stroke Patients Only)       Balance Overall balance assessment: Needs assistance Sitting-balance support: No upper extremity supported;Feet supported Sitting balance-Leahy Scale: Good     Standing balance support: No upper extremity supported;During functional activity Standing balance-Leahy Scale: Good Standing balance comment: Good standing balance with UE unsupported while washing hands                            Cognition Arousal/Alertness: Awake/alert Behavior During Therapy: WFL for tasks assessed/performed Overall Cognitive Status: Within Functional Limits for tasks assessed                                          Exercises Total Joint Exercises Ankle Circles/Pumps: AROM;10 reps Quad Sets: Strengthening;10 reps Heel Slides: Strengthening;10 reps Hip ABduction/ADduction: Strengthening;10 reps Straight Leg Raises: AROM;10 reps Long Arc Quad: Strengthening;AROM;10 reps Knee Flexion:  (>90 AROM flexion)    General Comments        Pertinent Vitals/Pain Pain Assessment: 0-10 Pain Score: 3     Home Living  Prior Function            PT Goals (current goals can now be found in the care plan section) Progress towards PT goals: Progressing toward goals    Frequency    BID      PT Plan Current plan remains appropriate    Co-evaluation              AM-PAC PT "6 Clicks" Mobility   Outcome Measure  Help needed turning from your back to your side while in a flat bed without using bedrails?: None Help needed moving from lying on your back to sitting on the side of a flat bed without using bedrails?: None Help needed moving to and from a  bed to a chair (including a wheelchair)?: None Help needed standing up from a chair using your arms (e.g., wheelchair or bedside chair)?: None Help needed to walk in hospital room?: None Help needed climbing 3-5 steps with a railing? : None 6 Click Score: 24    End of Session Equipment Utilized During Treatment: Gait belt Activity Tolerance: Patient tolerated treatment well Patient left: in chair;with call bell/phone within reach Nurse Communication: Mobility status PT Visit Diagnosis: Muscle weakness (generalized) (M62.81);Difficulty in walking, not elsewhere classified (R26.2);Pain Pain - Right/Left: Right Pain - part of body: Knee     Time: 8366-2947 PT Time Calculation (min) (ACUTE ONLY): 32 min  Charges:  $Gait Training: 8-22 mins $Therapeutic Exercise: 8-22 mins                     Malachi Pro, DPT 01/01/2021, 3:53 PM

## 2021-01-01 NOTE — Evaluation (Signed)
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   Clinical Impression   Pt seen for OT evaluation this date, POD#1 from above surgery. Upon arrival to room, pt seated upright in recliner with husband present. Pt agreeable to OT evaluation/tx. Pt was independent in all ADLs/IADLs prior to surgery. Pt currently presents with R LE pain/decreased ROM and decreased balance. Due to these functional impairments, pt requires MIN GUARD for seated LB dressing (without AD), MIN GUARD for simulated [name] transfers, SUPERVISION for functional mobility of short household distances (~194ft) with RW, and SUPERVISION for standing grooming tasks. Pt instructed in polar care mgt, falls prevention strategies, home/routines modifications, DME/AE for LB bathing and dressing tasks, and compression stocking mgt; pt and husband verbalized understanding and handout provided. Pt would benefit from skilled OT services including additional instruction in techniques with or without assistive devices for dressing and bathing skills to support recall and carryover prior to discharge and ultimately to maximize safety, independence, and minimize falls risk and caregiver burden. Do not currently anticipate any OT needs following this hospitalization.        Recommendations for follow up therapy are one component of a multi-disciplinary discharge planning process, led by the attending physician.  Recommendations may be updated based on patient status, additional functional criteria and insurance authorization.   Follow Up Recommendations  No OT follow up    Assistance Recommended at Discharge Frequent or constant Supervision/Assistance  Functional Status Assessment  Patient has had a recent decline in their functional status and demonstrates the ability to  make significant improvements in function in a reasonable and predictable amount of time.  Equipment Recommendations  Tub/shower bench       Precautions / Restrictions Precautions Precautions: Fall Restrictions Weight Bearing Restrictions: Yes RLE Weight Bearing: Weight bearing as tolerated      Mobility Bed Mobility Overal bed mobility: Modified Independent             General bed mobility comments: pt sitting in recliner at beginning/end of session    Transfers Overall transfer level: Needs assistance Equipment used: Rolling walker (2 wheels) Transfers: Sit to/from Stand Sit to Stand: Min guard           General transfer comment: Pt requires verbal cues for UE and LE positioning      Balance Overall balance assessment: Needs assistance Sitting-balance support: No upper extremity supported;Feet supported Sitting balance-Leahy Scale: Good     Standing balance support: No upper extremity supported;During functional activity Standing balance-Leahy Scale: Good Standing balance comment: Good standing balance with UE unsupported while washing hands                           ADL either performed or assessed with clinical judgement   ADL Overall ADL's : Needs assistance/impaired     Grooming: Wash/dry hands;Supervision/safety;Standing               Lower Body Dressing: Min guard;Sitting/lateral leans Lower Body Dressing Details (indicate cue type and reason): to don/doff socks with LE elevated on recliner Toilet Transfer: Min Emergency planning/management officer Details (indicate cue type and reason): simulated [name] transfer; pt requiring MIN GUARD and verbal cues for safe hand placement         Functional mobility during ADLs: Min guard;Rolling walker (  2 wheels) (to walk 151ft with RW)       Vision Baseline Vision/History: 1 Wears glasses              Pertinent Vitals/Pain Pain Assessment: 0-10 Pain Score: 3  Pain Location: R Knee Pain  Descriptors / Indicators: Aching Pain Intervention(s): Limited activity within patient's tolerance;Monitored during session        Extremity/Trunk Assessment Upper Extremity Assessment Upper Extremity Assessment: Overall WFL for tasks assessed   Lower Extremity Assessment Lower Extremity Assessment: RLE deficits/detail RLE Deficits / Details: s/p R TKA       Communication Communication Communication: No difficulties   Cognition Arousal/Alertness: Awake/alert Behavior During Therapy: WFL for tasks assessed/performed Overall Cognitive Status: Within Functional Limits for tasks assessed                                       General Comments  Pt showed good effort with initial PT session post surgery, good mobility, strength, ROM and gait.    Exercises  Other Exercises: Pt instructed in polar care mgt, falls prevention strategies, home/routines modifications, DME/AE for LB bathing and dressing tasks, and compression stocking mgt; handout provided. Pt and husband verbalized understanding.        Home Living Family/patient expects to be discharged to:: Private residence Living Arrangements: Spouse/significant other Available Help at Discharge: Family;Available 24 hours/day Type of Home: House Home Access: Stairs to enter [employer] of Steps: 3 Entrance Stairs-Rails: Right;Left (wide) Home Layout: Able to live on main level with bedroom/bathroom     Bathroom Shower/Tub: Tub/shower unit         Home Equipment: Agricultural consultant (2 wheels);Rollator (4 wheels);[name]          Prior Functioning/Environment Prior Level of Function : Independent/Modified Independent             Mobility Comments: Pt able to cook, clean, run errands, etc ADLs Comments: Pt independent with ADLs/IADLs        OT Problem List: Impaired balance (sitting and/or standing);Decreased range of motion;Decreased knowledge of use of DME or AE      OT  Treatment/Interventions: Self-care/ADL training;Therapeutic exercise;Energy conservation;DME and/or AE instruction;Therapeutic activities;Patient/family education;Balance training    OT Goals(Current goals can be found in the care plan section) Acute Rehab OT Goals Patient Stated Goal: to return home OT Goal Formulation: With patient Time For Goal Achievement: 01/15/21 Potential to Achieve Goals: Good ADL Goals Pt Will Perform Grooming: with modified independence;standing Pt Will Perform Lower Body Dressing: with modified independence;with adaptive equipment;sit to/from stand Pt Will Transfer to Toilet: with modified independence;ambulating;bedside commode  OT Frequency: Min 1X/week    AM-PAC OT "6 Clicks" Daily Activity     Outcome Measure Help from another person eating meals?: None Help from another person taking care of personal grooming?: A Little Help from another person toileting, which includes using toliet, bedpan, or urinal?: A Little Help from another person bathing (including washing, rinsing, drying)?: A Little Help from another person to put on and taking off regular upper body clothing?: None Help from another person to put on and taking off regular lower body clothing?: A Little 6 Click Score: 20   End of Session Equipment Utilized During Treatment: Rolling walker (2 wheels) Nurse Communication: Mobility status  Activity Tolerance: Patient tolerated treatment well Patient left: in chair;with call bell/phone within reach;with family/visitor present  OT Visit Diagnosis: Unsteadiness on feet (  R26.81)                Time: 6237-6283 OT Time Calculation (min): 30 min Charges:  OT General Charges $OT Visit: 1 Visit OT Evaluation $OT Eval Moderate Complexity: 1 Mod OT Treatments $Self Care/Home Management : 8-22 mins  Matthew Folks, OTR/L [facility] [phone]

## 2021-01-01 NOTE — Addendum Note (Signed)
Addendum  created 01/01/21 1145 by Foye Deer, MD   Clinical Note Signed, Order list changed, Order sets accessed, Pharmacy for encounter modified

## 2021-01-01 NOTE — Evaluation (Signed)
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  Clinical Impression  Pt did very well with POD1 PT exam and subsequent exercises and gait training.  Pt did reported having some issues with spasming last night but only had expected pain during session with no involuntary or unexpected symptoms.  She was able to ambulate nearly 200 ft, negotiated up/down 4 steps with only minimal explanation/cuing and was able to do ~15 minutes of strength and ROM exercises with good tolerance and effort.       Recommendations for follow up therapy are one component of a multi-disciplinary discharge planning process, led by the attending physician.  Recommendations may be updated based on patient status, additional functional criteria and insurance authorization.  Follow Up Recommendations Follow physician's recommendations for discharge plan and follow up therapies    Assistance Recommended at Discharge None  Functional Status Assessment Patient has had a recent decline in their functional status and demonstrates the ability to make significant improvements in function in a reasonable and predictable amount of time.  Equipment Recommendations  None recommended by PT    Recommendations for Other Services       Precautions / Restrictions Precautions Precautions: Fall Restrictions Weight Bearing Restrictions: Yes RLE Weight Bearing: Weight bearing as tolerated      Mobility  Bed Mobility Overal bed mobility: Modified Independent             General bed mobility comments: Pt able to get herself to sitting [name] w/o assist or extensive cuing    Transfers Overall transfer level: Needs assistance Equipment used: Rolling walker (2 wheels) Transfers: Sit to/from Stand Sit to Stand: Supervision           General  transfer comment: Pt needing explanation for LE and UE positioning and use, but able to rise from multiple different surfaces all w/o any physical assist    Ambulation/Gait Ambulation/Gait assistance: Supervision Gait Distance (Feet): 175 Feet Assistive device: Rolling walker (2 wheels)       General Gait Details: Pt was able to assume confident cadence and consistent walker motion w/o issue.  Vitals remain stable, no safety concerns.  Stairs Stairs: Yes Stairs assistance: Supervision Stair Management: One rail Right;Sideways;Step to pattern Number of Stairs: 4 General stair comments: Pt was able to negotiate up/down steps w/o issue.  Expected reliance on UEs/rail but safe and confident.  Wheelchair Mobility    Modified Rankin (Stroke Patients Only)       Balance Overall balance assessment: Modified Independent                                           Pertinent Vitals/Pain Pain Assessment: 0-10 Pain Score: 3  Pain Location: R Knee Pain Descriptors / Indicators: Aching Pain Intervention(s): Limited activity within patient's tolerance;Monitored during session    Home Living Family/patient expects to be discharged to:: Private residence Living Arrangements: Spouse/significant other Available Help at Discharge: Family;Available 24 hours/day Type of Home: House Home Access: Stairs to enter Entrance Stairs-Rails: Right;Left (wide) Entrance Stairs-Number of Steps: 3   Home Layout: Able to live on main level with bedroom/bathroom Home Equipment: Agricultural consultant (2 wheels);Rollator (4 wheels);[name]      Prior Function Prior Level of  Function : Independent/Modified Independent             Mobility Comments: Pt able to cook, clean, run errands, etc ADLs Comments: Pt independent with ADLs/IADLs     Hand Dominance        Extremity/Trunk Assessment   Upper Extremity Assessment Upper Extremity Assessment: Overall WFL for tasks assessed    Lower  Extremity Assessment Lower Extremity Assessment: Overall WFL for tasks assessed (expected post-op weakness but good quad control and ability to do SLRs)       Communication   Communication: No difficulties  Cognition Arousal/Alertness: Awake/alert Behavior During Therapy: WFL for tasks assessed/performed Overall Cognitive Status: Within Functional Limits for tasks assessed                                          General Comments General comments (skin integrity, edema, etc.): Pt showed good effort with initial PT session post surgery, good mobility, strength, ROM and gait.    Exercises Total Joint Exercises Ankle Circles/Pumps: AROM;10 reps Quad Sets: Strengthening;10 reps Short Arc Quad: Strengthening;10 reps Heel Slides: Strengthening;10 reps Hip ABduction/ADduction: Strengthening;5 reps Straight Leg Raises: AROM;10 reps Knee Flexion: PROM;5 reps Goniometric ROM: 1-95   Assessment/Plan    PT Assessment Patient needs continued PT services  PT Problem List Decreased strength;Decreased range of motion;Decreased activity tolerance;Decreased balance;Decreased mobility;Decreased coordination;Decreased knowledge of use of DME;Decreased safety awareness;Pain       PT Treatment Interventions DME instruction;Gait training;Stair training;Functional mobility training;Therapeutic activities;Therapeutic exercise;Balance training;Neuromuscular re-education;Patient/family education    PT Goals (Current goals can be found in the Care Plan section)  Acute Rehab PT Goals PT Goal Formulation: With patient Time For Goal Achievement: 01/15/21 Potential to Achieve Goals: Good    Frequency BID   Barriers to discharge        Co-evaluation               AM-PAC PT "6 Clicks" Mobility  Outcome Measure Help needed turning from your back to your side while in a flat bed without using bedrails?: None Help needed moving from lying on your back to sitting on the side of  a flat bed without using bedrails?: None Help needed moving to and from a bed to a chair (including a wheelchair)?: None Help needed standing up from a chair using your arms (e.g., wheelchair or bedside chair)?: None Help needed to walk in hospital room?: None Help needed climbing 3-5 steps with a railing? : None 6 Click Score: 24    End of Session Equipment Utilized During Treatment: Gait belt Activity Tolerance: Patient tolerated treatment well Patient left: in chair;with call bell/phone within reach Nurse Communication: Mobility status PT Visit Diagnosis: Muscle weakness (generalized) (M62.81);Difficulty in walking, not elsewhere classified (R26.2);Pain Pain - Right/Left: Right Pain - part of body: Knee    Time: 0850-0949 PT Time Calculation (min) (ACUTE ONLY): 59 min   Charges:   PT Evaluation $PT Eval Low Complexity: 1 Low PT Treatments $Gait Training: 8-22 mins $Therapeutic Exercise: 8-22 mins $Therapeutic Activity: 8-22 mins        Malachi Pro, DPT 01/01/2021, 10:25 AM

## 2021-01-01 NOTE — Anesthesia Postprocedure Evaluation (Signed)
Anesthesia Post Note  Patient: LAROSA RHINES  Procedure(s) Performed: COMPUTER ASSISTED TOTAL KNEE ARTHROPLASTY (Right: Knee)  Anesthesia Type: Spinal Anesthetic complications: no Comments: Pt with right eye pain that developed yesterday evening. She does not recall having the pain immediately after surgery. She has no history of eye problems such as glaucoma or stye. She describes it has having something in the eye that is scratchy. She reports minimal blurred vision from the eye but says it is likely due to the eye ointment artificial tears that she started to apply. I discussed the case with the in room CRNA from yesterday who reports the eye had slightly opened during the case, but he denies any obvious abrasive contract with the eye. The patients physical exam is positive for right sided conjunctival injection but normal motor and vision exam. Toradol eye drops will be ordered for the eye and pt will be reevaluated this afternoon.    No notable events documented.   Last Vitals:  Vitals:   01/01/21 0800 01/01/21 1140  BP: 138/63 (!) 111/55  Pulse: 64 64  Resp: 16 16  Temp: (!) 36.1 C (!) 36.1 C  SpO2: 98% 97%    Last Pain:  Vitals:   01/01/21 1140  TempSrc: Temporal  PainSc:                  Foye Deer

## 2021-01-01 NOTE — Discharge Summary (Signed)
Physician Discharge Summary  Patient ID: Hannah Tucker MRN: [id number] DOB/AGE: 1940/07/26 80 y.o.  Admit date: 12/31/2020 Discharge date: 01/01/2021  Admission Diagnoses:  Total knee replacement status [Z96.659]  Surgeries:Procedure(s): Right total knee arthroplasty using computer-assisted navigation   SURGEON:  Jena Gauss. M.D.   ASSISTANT: Baldwin Jamaica, PA-C (present and scrubbed throughout the case, critical for assistance with exposure, retraction, instrumentation, and closure)   ANESTHESIA: spinal   ESTIMATED BLOOD LOSS: 50 mL   FLUIDS REPLACED: 1400 mL of crystalloid   TOURNIQUET TIME: #1 - 19 minutes           #2 - 24 minutes   DRAINS: 2 medium Hemovac drains   SOFT TISSUE RELEASES: Anterior cruciate ligament, posterior cruciate ligament, deep  medial collateral ligament, patellofemoral ligament   IMPLANTS UTILIZED: DePuy Attune size 3 posterior stabilized femoral component (cemented), size 3 rotating platform tibial component (cemented), 32 mm medialized dome patella (cemented), and an 8 mm stabilized rotating platform polyethylene insert.  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   Family history of heart disease 04/12/2018   History of total knee arthroplasty, left 11/24/2016   Adult idiopathic generalized osteoporosis 03/01/2015   OSA on CPAP 01/30/2014    Past Medical History:  Diagnosis Date   Arthritis    Complication of anesthesia    nausea   Dyspnea    with exertion   Family history of adverse reaction to anesthesia    PONV- MOM   Hypertension    Hypothyroidism    PONV (postoperative nausea and vomiting)    Sleep apnea    uses CPAP nightly   Stress incontinence      Transfusion:    Consultants (if any):   Discharged Condition: Improved  Hospital Course: Hannah Tucker is an 80 y.o. female who was admitted 12/31/2020 with a diagnosis of right knee  osteoarthritis and went to the operating room on 12/31/2020 and underwent right total knee arthroplasty. The patient received perioperative antibiotics for prophylaxis (see below). The patient tolerated the procedure well and was transported to PACU in stable condition. After meeting PACU criteria, the patient was subsequently transferred to the Orthopaedics/Rehabilitation unit.   The patient received DVT prophylaxis in the form of early mobilization, Lovenox, Foot Pumps, and TED hose. A sacral pad had been placed and heels were elevated off of the bed with rolled towels in order to protect skin integrity. Foley catheter was discontinued on postoperative day #0. Wound drains were discontinued on postoperative day #1. The surgical incision was healing well without signs of infection.  Physical therapy was initiated postoperatively for transfers, gait training, and strengthening. Occupational therapy was initiated for activities of daily living and evaluation for assisted devices. Rehabilitation goals were reviewed in detail with the patient. The patient made steady progress with physical therapy and physical therapy recommended discharge to Home.   The patient achieved the preliminary goals of this hospitalization and was felt to be medically and orthopaedically appropriate for discharge.  She was given perioperative antibiotics:  Anti-infectives (From admission, onward)    Start     Dose/Rate Route Frequency Ordered Stop   01/01/21 0225  ceFAZolin (ANCEF) 2-4 GM/100ML-% IVPB       Note to Pharmacy: Rayann Heman   : cabinet override      01/01/21 0225 01/01/21 1429   12/31/20 2100  ceFAZolin (ANCEF) IVPB 2g/100 mL premix  2 g 200 mL/hr over 30 Minutes Intravenous Every 6 hours 12/31/20 1904 01/01/21 0859   12/31/20 0831  ceFAZolin (ANCEF) 2-4 GM/100ML-% IVPB       Note to Pharmacy: Christene Slates   : cabinet override      12/31/20 0831 01/01/21 0350   12/31/20 0600  ceFAZolin (ANCEF)  IVPB 2g/100 mL premix        2 g 200 mL/hr over 30 Minutes Intravenous On call to O.R. 12/31/20 0030 12/31/20 1535     .  Recent vital signs:  Vitals:   01/01/21 0800 01/01/21 1140  BP: 138/63 (!) 111/55  Pulse: 64 64  Resp: 16 16  Temp: (!) 96.9 F (36.1 C) (!) 96.9 F (36.1 C)  SpO2: 98% 97%    Recent laboratory studies:  No results for input(s): WBC, HGB, HCT, PLT, K, CL, CO2, BUN, CREATININE, GLUCOSE, CALCIUM, LABPT, INR in the last 72 hours.  Diagnostic Studies: DG Knee Right Port  Result Date: 12/31/2020 CLINICAL DATA:  Right knee arthroplasty EXAM: PORTABLE RIGHT KNEE - 1-2 VIEW COMPARISON:  None. FINDINGS: Changes of total right knee replacement. Soft tissue drain in place. No hardware bony complicating feature. IMPRESSION: Right knee replacement.  No visible complicating feature. Electronically Signed   By: Charlett Nose M.D.   On: 12/31/2020 19:57    Discharge Medications:   Allergies as of 01/01/2021       Reactions   Benadryl [diphenhydramine Hcl] Other (See Comments)   Pt unsure what reaction is but says she has not taken this in years because of a reaction   Biaxin [clarithromycin] Other (See Comments)   Unknown   Ciprofloxacin Other (See Comments)   Joint pain        Medication List     TAKE these medications    acetaminophen 500 MG tablet Commonly known as: TYLENOL Take 500 mg by mouth every 6 (six) hours as needed for moderate pain or headache.   amLODipine 5 MG tablet Commonly known as: NORVASC Take 5 mg by mouth at bedtime.   Biotin 1000 MCG tablet Take 1,000 mcg by mouth in the morning and at bedtime.   CALCIUM 600 + D PO Take 1 tablet by mouth daily.   celecoxib 200 MG capsule Commonly known as: CELEBREX Take 1 capsule (200 mg total) by mouth 2 (two) times daily.   clotrimazole-betamethasone cream Commonly known as: LOTRISONE Apply 1 application topically daily as needed (blisters on foot).   diclofenac sodium 1 % Gel Commonly  known as: VOLTAREN Apply 2 g topically 2 (two) times daily as needed (knee pain).   enoxaparin 40 MG/0.4ML injection Commonly known as: LOVENOX Inject 0.4 mLs (40 mg total) into the skin daily for 14 days.   levothyroxine 88 MCG tablet Commonly known as: SYNTHROID Take 88 mcg by mouth daily before breakfast.   losartan-hydrochlorothiazide 100-12.5 MG tablet Commonly known as: HYZAAR Take 1 tablet by mouth daily.   metaxalone 800 MG tablet Commonly known as: SKELAXIN Take 1 tablet (800 mg total) by mouth every 8 (eight) hours as needed for muscle spasms.   oxyCODONE 5 MG immediate release tablet Commonly known as: Oxy IR/ROXICODONE Take 1 tablet (5 mg total) by mouth every 4 (four) hours as needed for severe pain (for pain score of 5-7).   PRESERVISION AREDS 2+MULTI VIT PO Take 1 capsule by mouth 2 (two) times daily.   SYSTANE OP Place 1 drop into both eyes 3 (three) times daily.   traMADol 50  MG tablet Commonly known as: ULTRAM Take 1 tablet (50 mg total) by mouth every 4 (four) hours as needed for moderate pain.   vitamin C 1000 MG tablet Take 1,000 mg by mouth in the morning and at bedtime.               Durable Medical Equipment  (From admission, onward)           Start     Ordered   01/01/21 0930  For home use only DME Cane  Once        01/01/21 0929   12/31/20 2017  DME Walker rolling  Once       Question:  Patient needs a walker to treat with the following condition  Answer:  Total knee replacement status   12/31/20 2016   12/31/20 2017  DME Bedside commode  Once       Question:  Patient needs a bedside commode to treat with the following condition  Answer:  Total knee replacement status   12/31/20 2016            Disposition: Home with home health PT     Follow-up Information     Madelyn Flavors, PA-C Follow up on 01/15/2021.   Specialty: Orthopedic Surgery Why: at 9:45am Contact information: [address] [place] [address] [phone]         Donato Heinz, MD Follow up on 02/12/2021.   Specialty: Orthopedic Surgery Why: at 10:15am Contact information: [address] [facility] [address] [facility] [place] [address] [phone]                  Lasandra Beech, PA-C 01/01/2021, 1:29 PM

## 2021-01-01 NOTE — TOC Initial Note (Signed)
Transition of Care [facility]) - Initial/Assessment Note    Patient Details  Name: Hannah Tucker MRN: [id number] Date of Birth: 1940/09/11  [facility]) CM/SW Contact:    Iuka Cellar, RN Phone Number: 01/01/2021, 9:34 AM  Clinical Narrative:                 Spoke to patient and spouse at bedside. Patient eager to return home with home health and husband as primary caregiver. Patient reports she is already set up with [facility] preop and already has walker. RN CM confirmed with [place] @ [facility]. No reports of any needs obtaining medication and patient eager to discharge home.   Expected Discharge Plan: Home w Home Health Services Barriers to Discharge: No Barriers Identified   Patient Goals and CMS Choice Patient states their goals for this hospitalization and ongoing recovery are:: eager to return home      Expected Discharge Plan and Services Expected Discharge Plan: Home w Home Health Services       Living arrangements for the past 2 months: Single Family Home                           HH Arranged: PT HH Agency: [facility] Date [facility] Agency Contacted: 01/01/21 Time HH Agency Contacted: [phone] Representative spoke with at [facility] Agency: [place]  Prior Living Arrangements/Services Living arrangements for the past 2 months: Single Family Home Lives with:: Spouse Patient language and need for interpreter reviewed:: Yes Do you feel safe going back to the place where you live?: Yes      Need for Family Participation in Patient Care: Yes (Comment) Care giver support system in place?: Yes (comment)   Criminal Activity/Legal Involvement Pertinent to Current Situation/Hospitalization: No - Comment as needed  Activities of Daily Living Home Assistive Devices/Equipment: Eyeglasses, CPAP ADL Screening (condition at time of admission) Patient's cognitive ability adequate to safely complete daily activities?: Yes 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) Does the patient have difficulty seeing, even when wearing glasses/contacts?: No (glasses) Does the patient have difficulty concentrating, remembering, or making decisions?: Yes (age related) Patient able to express need for assistance with ADLs?: Yes Does the patient have difficulty dressing or bathing?: No Independently performs ADLs?: Yes (appropriate for developmental age) Does the patient have difficulty walking or climbing stairs?: Yes Weakness of Legs: Left Weakness of Arms/Hands: None  Permission Sought/Granted                  Emotional Assessment Appearance:: Appears stated age Attitude/Demeanor/Rapport: Engaged, Ambitious Affect (typically observed): Accepting Orientation: : Oriented to Self, Oriented to Place, Oriented to  Time, Oriented to Situation Alcohol / Substance Use: Not Applicable Psych Involvement: No (comment)  Admission diagnosis:  Total knee replacement status [Z96.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 [place] [address] Phone: [phone] Fax: [phone]  [id number] - [place], [place] - [address] [place] [address] Phone: [phone] Fax: [phone]  [facility] [phone] -  [place], [place] - [address] AT [facility] [address] [place] [address] Phone: [phone] Fax: [phone]     Social Determinants of Health (SDOH) Interventions    Readmission Risk Interventions No flowsheet data found.

## 2021-01-01 NOTE — Progress Notes (Addendum)
  Subjective: 1 Day Post-Op Procedure(s) (LRB): COMPUTER ASSISTED TOTAL KNEE ARTHROPLASTY (Right) Patient reports pain as well-controlled.   Patient is having problems with cramping in the lower leg, but this has resolved since taking skelaxin this AM.  Also reports some irritation of her eyes. Plan is to go Home after hospital stay. Negative for chest pain and shortness of breath Fever: no Gastrointestinal: negative for nausea and vomiting.  Patient has not had a bowel movement.  Objective: Vital signs in last 24 hours: Temp:  [96.8 F (36 C)-98.1 F (36.7 C)] 96.9 F (36.1 C) (11/01 0800) Pulse Rate:  [60-72] 64 (11/01 0800) Resp:  [15-16] 16 (11/01 0800) BP: (136-169)/(63-77) 138/63 (11/01 0800) SpO2:  [94 %-98 %] 98 % (11/01 0800)  Intake/Output from previous day:  Intake/Output Summary (Last 24 hours) at 01/01/2021 0927 Last data filed at 01/01/2021 0845 Gross per 24 hour  Intake 3011.67 ml  Output 1585 ml  Net 1426.67 ml    Intake/Output this shift: Total I/O In: -  Out: 100 [Urine:100]  Labs: No results for input(s): HGB in the last 72 hours. No results for input(s): WBC, RBC, HCT, PLT in the last 72 hours. No results for input(s): NA, K, CL, CO2, BUN, CREATININE, GLUCOSE, CALCIUM in the last 72 hours. No results for input(s): LABPT, INR in the last 72 hours.   EXAM General - Patient is Alert, Appropriate, and Oriented Extremity - Neurovascular intact Dorsiflexion/Plantar flexion intact Compartment soft Dressing/Incision -Postoperative dressing remains in place., Polar Care in place and working. , Hemovac in place.  Motor Function - intact, moving foot and toes well on exam. Able to perform SLR with assistance.   Cardiovascular- Regular rate and rhythm, no murmurs/rubs/gallops Respiratory- Lungs clear to auscultation bilaterally Gastrointestinal- soft, nontender, and active bowel sounds   Assessment/Plan: 1 Day Post-Op Procedure(s) (LRB): COMPUTER ASSISTED  TOTAL KNEE ARTHROPLASTY (Right) Active Problems:   Total knee replacement status  Estimated body mass index is 34.07 kg/m as calculated from the following:   Height as of this encounter: 5\' 1"  (1.549 m).   Weight as of this encounter: 81.8 kg. Advance diet Up with therapy  Spoke with RN about lack of bone foam and incentive spirometer   Anesthesia advised to speak with patient regarding eye irritation  DVT Prophylaxis - Lovenox, Ted hose, and foot pumps Weight-Bearing as tolerated to right leg  , PA-C [facility] Orthopaedic Surgery 01/01/2021, 9:27 AM

## 2021-01-01 NOTE — Anesthesia Postprocedure Evaluation (Signed)
Anesthesia Post Note  Patient: Hannah Tucker  Procedure(s) Performed: COMPUTER ASSISTED TOTAL KNEE ARTHROPLASTY (Right: Knee)  Patient location during evaluation: Nursing Unit Anesthesia Type: Spinal Level of consciousness: oriented and awake and alert Pain management: pain level controlled Vital Signs Assessment: post-procedure vital signs reviewed and stable Respiratory status: spontaneous breathing and respiratory function stable Cardiovascular status: blood pressure returned to baseline and stable Postop Assessment: no headache, no backache, no apparent nausea or vomiting and patient able to bend at knees Anesthetic complications: no Comments: Patient reported to Dr. Ernest Pine that her Right eye was painful. I did notice some redness to the Right eye...but the patient did not report any issues to me. I spoke with Dr. Genelle Bal about this issue and requested that he evaluate the patient.   No notable events documented.   Last Vitals:  Vitals:   01/01/21 0400 01/01/21 0800  BP:  138/63  Pulse: 65 64  Resp: 16 16  Temp: (!) 36.2 C (!) 36.1 C  SpO2: 95% 98%    Last Pain:  Vitals:   01/01/21 0800  TempSrc: Temporal  PainSc:                  Starling Manns

## 2021-01-01 NOTE — Progress Notes (Signed)
Post-op dressing removed. , Hemovac removed., and Mini compression dressing applied.    

## 2021-01-03 ENCOUNTER — Emergency Department (HOSPITAL_COMMUNITY): Payer: No Typology Code available for payment source

## 2021-01-03 ENCOUNTER — Inpatient Hospital Stay (HOSPITAL_COMMUNITY): Payer: No Typology Code available for payment source

## 2021-01-03 ENCOUNTER — Inpatient Hospital Stay (HOSPITAL_COMMUNITY)
Admission: EM | Admit: 2021-01-03 | Discharge: 2021-01-08 | DRG: 493 | Disposition: A | Discharge status: Rehabilitation Facility | Payer: No Typology Code available for payment source | Attending: Internal Medicine | Admitting: Internal Medicine

## 2021-01-03 ENCOUNTER — Encounter (HOSPITAL_COMMUNITY): Payer: Self-pay | Admitting: Family Medicine

## 2021-01-03 ENCOUNTER — Other Ambulatory Visit: Payer: Self-pay

## 2021-01-03 DIAGNOSIS — G4733 Obstructive sleep apnea (adult) (pediatric): Secondary | ICD-10-CM | POA: Diagnosis present

## 2021-01-03 DIAGNOSIS — S93432A Sprain of tibiofibular ligament of left ankle, initial encounter: Secondary | ICD-10-CM | POA: Diagnosis present

## 2021-01-03 DIAGNOSIS — I10 Essential (primary) hypertension: Secondary | ICD-10-CM | POA: Diagnosis present

## 2021-01-03 DIAGNOSIS — D62 Acute posthemorrhagic anemia: Secondary | ICD-10-CM | POA: Diagnosis present

## 2021-01-03 DIAGNOSIS — S9305XA Dislocation of left ankle joint, initial encounter: Secondary | ICD-10-CM | POA: Diagnosis present

## 2021-01-03 DIAGNOSIS — K5900 Constipation, unspecified: Secondary | ICD-10-CM | POA: Diagnosis present

## 2021-01-03 DIAGNOSIS — M62838 Other muscle spasm: Secondary | ICD-10-CM | POA: Diagnosis present

## 2021-01-03 DIAGNOSIS — S82892B Other fracture of left lower leg, initial encounter for open fracture type I or II: Secondary | ICD-10-CM | POA: Diagnosis not present

## 2021-01-03 DIAGNOSIS — H538 Other visual disturbances: Secondary | ICD-10-CM | POA: Diagnosis present

## 2021-01-03 DIAGNOSIS — G8918 Other acute postprocedural pain: Secondary | ICD-10-CM | POA: Diagnosis present

## 2021-01-03 DIAGNOSIS — Z419 Encounter for procedure for purposes other than remedying health state, unspecified: Secondary | ICD-10-CM

## 2021-01-03 DIAGNOSIS — Z6833 Body mass index (BMI) 33.0-33.9, adult: Secondary | ICD-10-CM | POA: Diagnosis not present

## 2021-01-03 DIAGNOSIS — Z79890 Hormone replacement therapy (postmenopausal): Secondary | ICD-10-CM | POA: Diagnosis not present

## 2021-01-03 DIAGNOSIS — E039 Hypothyroidism, unspecified: Secondary | ICD-10-CM | POA: Diagnosis present

## 2021-01-03 DIAGNOSIS — G4730 Sleep apnea, unspecified: Secondary | ICD-10-CM | POA: Diagnosis present

## 2021-01-03 DIAGNOSIS — Z881 Allergy status to other antibiotic agents status: Secondary | ICD-10-CM | POA: Diagnosis not present

## 2021-01-03 DIAGNOSIS — Z79899 Other long term (current) drug therapy: Secondary | ICD-10-CM

## 2021-01-03 DIAGNOSIS — S82852B Displaced trimalleolar fracture of left lower leg, initial encounter for open fracture type I or II: Secondary | ICD-10-CM | POA: Diagnosis present

## 2021-01-03 DIAGNOSIS — G479 Sleep disorder, unspecified: Secondary | ICD-10-CM | POA: Diagnosis not present

## 2021-01-03 DIAGNOSIS — K5901 Slow transit constipation: Secondary | ICD-10-CM | POA: Diagnosis not present

## 2021-01-03 DIAGNOSIS — Z888 Allergy status to other drugs, medicaments and biological substances status: Secondary | ICD-10-CM | POA: Diagnosis not present

## 2021-01-03 DIAGNOSIS — E876 Hypokalemia: Secondary | ICD-10-CM | POA: Diagnosis present

## 2021-01-03 DIAGNOSIS — M898X9 Other specified disorders of bone, unspecified site: Secondary | ICD-10-CM | POA: Diagnosis present

## 2021-01-03 DIAGNOSIS — E559 Vitamin D deficiency, unspecified: Secondary | ICD-10-CM | POA: Diagnosis present

## 2021-01-03 DIAGNOSIS — Z23 Encounter for immunization: Secondary | ICD-10-CM

## 2021-01-03 DIAGNOSIS — Z79891 Long term (current) use of opiate analgesic: Secondary | ICD-10-CM | POA: Diagnosis not present

## 2021-01-03 DIAGNOSIS — S82872E Displaced pilon fracture of left tibia, subsequent encounter for open fracture type I or II with routine healing: Secondary | ICD-10-CM | POA: Diagnosis present

## 2021-01-03 DIAGNOSIS — Z96652 Presence of left artificial knee joint: Secondary | ICD-10-CM | POA: Diagnosis present

## 2021-01-03 DIAGNOSIS — T148XXA Other injury of unspecified body region, initial encounter: Secondary | ICD-10-CM

## 2021-01-03 DIAGNOSIS — R0902 Hypoxemia: Secondary | ICD-10-CM | POA: Diagnosis present

## 2021-01-03 DIAGNOSIS — S82892S Other fracture of left lower leg, sequela: Secondary | ICD-10-CM | POA: Diagnosis present

## 2021-01-03 DIAGNOSIS — Z20822 Contact with and (suspected) exposure to covid-19: Secondary | ICD-10-CM | POA: Diagnosis present

## 2021-01-03 DIAGNOSIS — Z713 Dietary counseling and surveillance: Secondary | ICD-10-CM | POA: Diagnosis not present

## 2021-01-03 DIAGNOSIS — Z96651 Presence of right artificial knee joint: Secondary | ICD-10-CM | POA: Diagnosis present

## 2021-01-03 DIAGNOSIS — M25572 Pain in left ankle and joints of left foot: Secondary | ICD-10-CM | POA: Diagnosis present

## 2021-01-03 DIAGNOSIS — Y92413 State road as the place of occurrence of the external cause: Secondary | ICD-10-CM | POA: Diagnosis not present

## 2021-01-03 DIAGNOSIS — T1490XA Injury, unspecified, initial encounter: Secondary | ICD-10-CM

## 2021-01-03 DIAGNOSIS — Z96653 Presence of artificial knee joint, bilateral: Secondary | ICD-10-CM | POA: Diagnosis present

## 2021-01-03 DIAGNOSIS — M1990 Unspecified osteoarthritis, unspecified site: Secondary | ICD-10-CM | POA: Diagnosis present

## 2021-01-03 DIAGNOSIS — E669 Obesity, unspecified: Secondary | ICD-10-CM | POA: Diagnosis present

## 2021-01-03 LAB — I-STAT CHEM 8, ED
BUN: 14 mg/dL (ref 8–23)
Calcium, Ion: 1.06 mmol/L — ABNORMAL LOW (ref 1.15–1.40)
Chloride: 103 mmol/L (ref 98–111)
Creatinine, Ser: 0.7 mg/dL (ref 0.44–1.00)
Glucose, Bld: 111 mg/dL — ABNORMAL HIGH (ref 70–99)
HCT: 36 % (ref 36.0–46.0)
Hemoglobin: 12.2 g/dL (ref 12.0–15.0)
Potassium: 3.2 mmol/L — ABNORMAL LOW (ref 3.5–5.1)
Sodium: 140 mmol/L (ref 135–145)
TCO2: 26 mmol/L (ref 22–32)

## 2021-01-03 LAB — CBC WITH DIFFERENTIAL/PLATELET
Abs Immature Granulocytes: 0.06 10*3/uL (ref 0.00–0.07)
Basophils Absolute: 0 10*3/uL (ref 0.0–0.1)
Basophils Relative: 0 %
Eosinophils Absolute: 0.1 10*3/uL (ref 0.0–0.5)
Eosinophils Relative: 1 %
HCT: 35.2 % — ABNORMAL LOW (ref 36.0–46.0)
Hemoglobin: 11.3 g/dL — ABNORMAL LOW (ref 12.0–15.0)
Immature Granulocytes: 1 %
Lymphocytes Relative: 8 %
Lymphs Abs: 0.9 10*3/uL (ref 0.7–4.0)
MCH: 27 pg (ref 26.0–34.0)
MCHC: 32.1 g/dL (ref 30.0–36.0)
MCV: 84.2 fL (ref 80.0–100.0)
Monocytes Absolute: 0.8 10*3/uL (ref 0.1–1.0)
Monocytes Relative: 7 %
Neutro Abs: 9.3 10*3/uL — ABNORMAL HIGH (ref 1.7–7.7)
Neutrophils Relative %: 83 %
Platelets: 269 10*3/uL (ref 150–400)
RBC: 4.18 MIL/uL (ref 3.87–5.11)
RDW: 14.5 % (ref 11.5–15.5)
WBC: 11.2 10*3/uL — ABNORMAL HIGH (ref 4.0–10.5)
nRBC: 0 % (ref 0.0–0.2)

## 2021-01-03 LAB — COMPREHENSIVE METABOLIC PANEL
ALT: 26 U/L (ref 0–44)
AST: 48 U/L — ABNORMAL HIGH (ref 15–41)
Albumin: 2.9 g/dL — ABNORMAL LOW (ref 3.5–5.0)
Alkaline Phosphatase: 36 U/L — ABNORMAL LOW (ref 38–126)
Anion gap: 9 (ref 5–15)
BUN: 13 mg/dL (ref 8–23)
CO2: 26 mmol/L (ref 22–32)
Calcium: 8.6 mg/dL — ABNORMAL LOW (ref 8.9–10.3)
Chloride: 102 mmol/L (ref 98–111)
Creatinine, Ser: 0.72 mg/dL (ref 0.44–1.00)
GFR, Estimated: >60 mL/min (ref >60)
Glucose, Bld: 136 mg/dL — ABNORMAL HIGH (ref 70–99)
Potassium: 3.8 mmol/L (ref 3.5–5.1)
Sodium: 137 mmol/L (ref 135–145)
Total Bilirubin: 0.9 mg/dL (ref 0.3–1.2)
Total Protein: 6 g/dL — ABNORMAL LOW (ref 6.5–8.1)

## 2021-01-03 LAB — RESP PANEL BY RT-PCR (FLU A&B, COVID) ARPGX2
Influenza A by PCR: NEGATIVE
Influenza B by PCR: NEGATIVE
SARS Coronavirus 2 by RT PCR: NEGATIVE

## 2021-01-03 LAB — TYPE AND SCREEN
ABO/RH(D): A NEG
Antibody Screen: NEGATIVE

## 2021-01-03 LAB — PROTIME-INR
INR: 1.1 (ref 0.8–1.2)
Prothrombin Time: 14.3 seconds (ref 11.4–15.2)

## 2021-01-03 MED ORDER — LOSARTAN POTASSIUM-HCTZ 100-12.5 MG PO TABS: 1.0000 | ORAL_TABLET | Freq: Every day | ORAL | Status: DC

## 2021-01-03 MED ORDER — ONDANSETRON HCL 4 MG PO TABS
4.0000 mg | ORAL_TABLET | Freq: Four times a day (QID) | ORAL | Status: DC | PRN
  Administered 2021-01-04 – 2021-01-06 (×2): 4 mg via ORAL
  Filled 2021-01-03 (×2): qty 1

## 2021-01-03 MED ORDER — LEVOTHYROXINE SODIUM 88 MCG PO TABS
88.0000 ug | ORAL_TABLET | Freq: Every day | ORAL | Status: DC
  Administered 2021-01-05 – 2021-01-08 (×4): 88 ug via ORAL
  Filled 2021-01-03 (×6): qty 1

## 2021-01-03 MED ORDER — LOSARTAN POTASSIUM 50 MG PO TABS
100.0000 mg | ORAL_TABLET | Freq: Every day | ORAL | Status: DC
  Administered 2021-01-05 – 2021-01-08 (×4): 100 mg via ORAL
  Filled 2021-01-03 (×4): qty 2

## 2021-01-03 MED ORDER — AMLODIPINE BESYLATE 5 MG PO TABS
5.0000 mg | ORAL_TABLET | Freq: Every day | ORAL | Status: DC
  Administered 2021-01-03 – 2021-01-07 (×5): 5 mg via ORAL
  Filled 2021-01-03 (×5): qty 1

## 2021-01-03 MED ORDER — ACETAMINOPHEN 325 MG PO TABS
650.0000 mg | ORAL_TABLET | Freq: Four times a day (QID) | ORAL | Status: DC | PRN
  Administered 2021-01-03: 650 mg via ORAL
  Filled 2021-01-03: qty 2

## 2021-01-03 MED ORDER — HYDROMORPHONE HCL 1 MG/ML IJ SOLN
1.0000 mg | Freq: Once | INTRAMUSCULAR | Status: AC
  Administered 2021-01-03: 1 mg via INTRAVENOUS
  Filled 2021-01-03: qty 1

## 2021-01-03 MED ORDER — ONDANSETRON HCL 4 MG/2ML IJ SOLN: 4.0000 mg | Freq: Four times a day (QID) | INTRAMUSCULAR | Status: DC | PRN

## 2021-01-03 MED ORDER — IOHEXOL 350 MG/ML SOLN
100.0000 mL | Freq: Once | INTRAVENOUS | Status: AC | PRN
  Administered 2021-01-03: 100 mL via INTRAVENOUS

## 2021-01-03 MED ORDER — SENNOSIDES-DOCUSATE SODIUM 8.6-50 MG PO TABS
1.0000 | ORAL_TABLET | Freq: Every evening | ORAL | Status: DC | PRN
  Administered 2021-01-08: 1 via ORAL
  Filled 2021-01-03: qty 1

## 2021-01-03 MED ORDER — ACETAMINOPHEN 650 MG RE SUPP: 650.0000 mg | Freq: Four times a day (QID) | RECTAL | Status: DC | PRN

## 2021-01-03 MED ORDER — ENOXAPARIN SODIUM 40 MG/0.4ML IJ SOSY: 40.0000 mg | PREFILLED_SYRINGE | INTRAMUSCULAR | Status: DC

## 2021-01-03 MED ORDER — HYDROMORPHONE HCL 1 MG/ML IJ SOLN
1.0000 mg | INTRAMUSCULAR | Status: AC | PRN
  Administered 2021-01-04 – 2021-01-06 (×4): 1 mg via INTRAVENOUS
  Filled 2021-01-03 (×4): qty 1

## 2021-01-03 MED ORDER — CEFAZOLIN SODIUM-DEXTROSE 1-4 GM/50ML-% IV SOLN
1.0000 g | Freq: Once | INTRAVENOUS | Status: DC
  Filled 2021-01-03: qty 50

## 2021-01-03 MED ORDER — CEFAZOLIN SODIUM-DEXTROSE 2-4 GM/100ML-% IV SOLN
2.0000 g | Freq: Once | INTRAVENOUS | Status: AC
  Administered 2021-01-03: 2 g via INTRAVENOUS

## 2021-01-03 MED ORDER — SODIUM CHLORIDE 0.9 % IV SOLN: INTRAVENOUS | Status: DC

## 2021-01-03 MED ORDER — CEFAZOLIN SODIUM-DEXTROSE 2-4 GM/100ML-% IV SOLN
2.0000 g | Freq: Three times a day (TID) | INTRAVENOUS | Status: DC
  Administered 2021-01-03 – 2021-01-04 (×2): 2 g via INTRAVENOUS
  Filled 2021-01-03 (×2): qty 100

## 2021-01-03 MED ORDER — HYDROCHLOROTHIAZIDE 12.5 MG PO TABS
12.5000 mg | ORAL_TABLET | Freq: Every day | ORAL | Status: DC
  Administered 2021-01-05 – 2021-01-08 (×4): 12.5 mg via ORAL
  Filled 2021-01-03 (×4): qty 1

## 2021-01-03 MED ORDER — TETANUS-DIPHTH-ACELL PERTUSSIS 5-2.5-18.5 LF-MCG/0.5 IM SUSY
0.5000 mL | PREFILLED_SYRINGE | Freq: Once | INTRAMUSCULAR | Status: AC
  Administered 2021-01-03: 0.5 mL via INTRAMUSCULAR
  Filled 2021-01-03: qty 0.5

## 2021-01-03 MED ORDER — LACTATED RINGERS IV SOLN: INTRAVENOUS | Status: DC

## 2021-01-03 NOTE — Anesthesia Preprocedure Evaluation (Addendum)
Anesthesia Evaluation  Patient identified by MRN, date of birth, ID band Patient awake    Reviewed: Allergy & Precautions, NPO status , Patient's Chart, lab work & pertinent test results  History of Anesthesia Complications (+) PONV and history of anesthetic complications  Airway Mallampati: II  TM Distance: >3 FB Neck ROM: Full    Dental no notable dental hx.    Pulmonary sleep apnea and Continuous Positive Airway Pressure Ventilation ,    Pulmonary exam normal        Cardiovascular hypertension, Pt. on medications Normal cardiovascular exam     Neuro/Psych negative neurological ROS  negative psych ROS   GI/Hepatic negative GI ROS, Neg liver ROS,   Endo/Other  Hypothyroidism   Renal/GU negative Renal ROS  negative genitourinary   Musculoskeletal  (+) Arthritis ,   Abdominal   Peds  Hematology negative hematology ROS (+)   Anesthesia Other Findings Day of surgery medications reviewed with patient.  Reproductive/Obstetrics negative OB ROS                           Anesthesia Physical Anesthesia Plan  ASA: 2  Anesthesia Plan: General   Post-op Pain Management: GA combined w/ Regional for post-op pain   Induction: Intravenous  PONV Risk Score and Plan: 4 or greater and Treatment may vary due to age or medical condition, Ondansetron, Dexamethasone, Propofol infusion and TIVA  Airway Management Planned: Oral ETT  Additional Equipment: None  Intra-op Plan:   Post-operative Plan: Extubation in OR  Informed Consent: I have reviewed the patients History and Physical, chart, labs and discussed the procedure including the risks, benefits and alternatives for the proposed anesthesia with the patient or authorized representative who has indicated his/her understanding and acceptance.     Dental advisory given  Plan Discussed with: CRNA  Anesthesia Plan Comments:        Anesthesia Quick Evaluation
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
## 2021-01-03 NOTE — ED Notes (Signed)
Patient transported to CT with TRN.  

## 2021-01-03 NOTE — ED Notes (Signed)
Family friend Gordy Levan can be reached at [phone].

## 2021-01-03 NOTE — ED Notes (Signed)
Delay in CT due to reduction of open ankle fracture
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
## 2021-01-03 NOTE — Progress Notes (Signed)
Orthopedic Tech Progress Note Patient Details:  Hannah Tucker 1940/09/20 [id number]  Ortho Devices Type of Ortho Device: Short leg splint Ortho Device/Splint Location: lle Ortho Device/Splint Interventions: Ordered, Application, Adjustment  I assisted the doctor with short leg splint. Post Interventions Patient Tolerated: Well Instructions Provided: Care of device, Poper ambulation with device  Lurdes Haltiwanger L Maximillian Habibi 01/03/2021, 6:55 PM
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
## 2021-01-03 NOTE — ED Notes (Signed)
Lab called to check on status, they stated they never received it. New labs sent

## 2021-01-04 ENCOUNTER — Encounter (HOSPITAL_COMMUNITY): Payer: Self-pay | Admitting: Family Medicine

## 2021-01-04 ENCOUNTER — Inpatient Hospital Stay (HOSPITAL_COMMUNITY): Payer: No Typology Code available for payment source

## 2021-01-04 ENCOUNTER — Inpatient Hospital Stay (HOSPITAL_COMMUNITY): Payer: No Typology Code available for payment source | Admitting: Anesthesiology

## 2021-01-04 ENCOUNTER — Encounter (HOSPITAL_COMMUNITY)
Admission: EM | Disposition: A | Discharge status: Rehabilitation Facility | Payer: Self-pay | Source: Home / Self Care | Attending: Internal Medicine

## 2021-01-04 DIAGNOSIS — S82892B Other fracture of left lower leg, initial encounter for open fracture type I or II: Secondary | ICD-10-CM | POA: Diagnosis not present

## 2021-01-04 HISTORY — PX: ORIF ANKLE FRACTURE: SHX5408

## 2021-01-04 HISTORY — PX: APPLICATION OF WOUND VAC: SHX5189

## 2021-01-04 HISTORY — PX: I & D EXTREMITY: SHX5045

## 2021-01-04 LAB — CBC
HCT: 30.8 % — ABNORMAL LOW (ref 36.0–46.0)
HCT: 33.5 % — ABNORMAL LOW (ref 36.0–46.0)
Hemoglobin: 10.2 g/dL — ABNORMAL LOW (ref 12.0–15.0)
Hemoglobin: 10.9 g/dL — ABNORMAL LOW (ref 12.0–15.0)
MCH: 27.3 pg (ref 26.0–34.0)
MCH: 26.8 pg (ref 26.0–34.0)
MCHC: 33.1 g/dL (ref 30.0–36.0)
MCHC: 32.5 g/dL (ref 30.0–36.0)
MCV: 82.6 fL (ref 80.0–100.0)
MCV: 82.5 fL (ref 80.0–100.0)
Platelets: 243 10*3/uL (ref 150–400)
Platelets: 261 10*3/uL (ref 150–400)
RBC: 3.73 MIL/uL — ABNORMAL LOW (ref 3.87–5.11)
RBC: 4.06 MIL/uL (ref 3.87–5.11)
RDW: 14.3 % (ref 11.5–15.5)
RDW: 14.3 % (ref 11.5–15.5)
WBC: 7.2 10*3/uL (ref 4.0–10.5)
WBC: 9.2 10*3/uL (ref 4.0–10.5)
nRBC: 0 % (ref 0.0–0.2)
nRBC: 0 % (ref 0.0–0.2)

## 2021-01-04 LAB — BASIC METABOLIC PANEL
Anion gap: 6 (ref 5–15)
BUN: 7 mg/dL — ABNORMAL LOW (ref 8–23)
CO2: 28 mmol/L (ref 22–32)
Calcium: 8.1 mg/dL — ABNORMAL LOW (ref 8.9–10.3)
Chloride: 102 mmol/L (ref 98–111)
Creatinine, Ser: 0.68 mg/dL (ref 0.44–1.00)
GFR, Estimated: >60 mL/min (ref >60)
Glucose, Bld: 246 mg/dL — ABNORMAL HIGH (ref 70–99)
Potassium: 3.5 mmol/L (ref 3.5–5.1)
Sodium: 136 mmol/L (ref 135–145)

## 2021-01-04 LAB — VITAMIN D 25 HYDROXY (VIT D DEFICIENCY, FRACTURES): Vit D, 25-Hydroxy: 19.55 ng/mL — ABNORMAL LOW (ref 30–100)

## 2021-01-04 LAB — CREATININE, SERUM
Creatinine, Ser: 0.63 mg/dL (ref 0.44–1.00)
GFR, Estimated: >60 mL/min (ref >60)

## 2021-01-04 LAB — SURGICAL PCR SCREEN
MRSA, PCR: NEGATIVE
Staphylococcus aureus: NEGATIVE

## 2021-01-04 SURGERY — IRRIGATION AND DEBRIDEMENT EXTREMITY
Anesthesia: General | Site: Leg Lower | Laterality: Right

## 2021-01-04 MED ORDER — VANCOMYCIN HCL 1000 MG IV SOLR
INTRAVENOUS | Status: AC
  Filled 2021-01-04: qty 20

## 2021-01-04 MED ORDER — DOCUSATE SODIUM 100 MG PO CAPS
100.0000 mg | ORAL_CAPSULE | Freq: Two times a day (BID) | ORAL | Status: DC
  Administered 2021-01-04 – 2021-01-08 (×7): 100 mg via ORAL
  Filled 2021-01-04 (×8): qty 1

## 2021-01-04 MED ORDER — ENSURE PRE-SURGERY PO LIQD
296.0000 mL | Freq: Once | ORAL | Status: AC
  Administered 2021-01-04: 296 mL via ORAL
  Filled 2021-01-04: qty 296

## 2021-01-04 MED ORDER — SODIUM CHLORIDE 0.9 % IV SOLN
2.0000 g | INTRAVENOUS | Status: AC
  Administered 2021-01-04 – 2021-01-06 (×3): 2 g via INTRAVENOUS
  Filled 2021-01-04 (×3): qty 20

## 2021-01-04 MED ORDER — CHLORHEXIDINE GLUCONATE 0.12 % MT SOLN
OROMUCOSAL | Status: AC
  Administered 2021-01-04: 15 mL
  Filled 2021-01-04: qty 15

## 2021-01-04 MED ORDER — GLYCOPYRROLATE 0.2 MG/ML IJ SOLN
INTRAMUSCULAR | Status: DC | PRN
  Administered 2021-01-04: .2 mg via INTRAVENOUS

## 2021-01-04 MED ORDER — ONDANSETRON HCL 4 MG/2ML IJ SOLN
INTRAMUSCULAR | Status: AC
  Filled 2021-01-04: qty 2

## 2021-01-04 MED ORDER — SODIUM CHLORIDE 0.9 % IR SOLN
Status: DC | PRN
  Administered 2021-01-04: 6000 mL

## 2021-01-04 MED ORDER — LIDOCAINE 2% (20 MG/ML) 5 ML SYRINGE
INTRAMUSCULAR | Status: AC
  Filled 2021-01-04: qty 10

## 2021-01-04 MED ORDER — ONDANSETRON HCL 4 MG/2ML IJ SOLN
INTRAMUSCULAR | Status: DC | PRN
  Administered 2021-01-04 (×2): 4 mg via INTRAVENOUS

## 2021-01-04 MED ORDER — MOXIFLOXACIN HCL 0.5 % OP SOLN
1.0000 [drp] | Freq: Three times a day (TID) | OPHTHALMIC | Status: DC
  Administered 2021-01-04 – 2021-01-08 (×11): 1 [drp] via OPHTHALMIC

## 2021-01-04 MED ORDER — PROPOFOL 10 MG/ML IV BOLUS
INTRAVENOUS | Status: AC
  Filled 2021-01-04: qty 20

## 2021-01-04 MED ORDER — MIDAZOLAM HCL 2 MG/2ML IJ SOLN
INTRAMUSCULAR | Status: AC
  Filled 2021-01-04: qty 2

## 2021-01-04 MED ORDER — METOCLOPRAMIDE HCL 10 MG PO TABS: 5.0000 mg | ORAL_TABLET | Freq: Three times a day (TID) | ORAL | Status: DC | PRN

## 2021-01-04 MED ORDER — ASCORBIC ACID 500 MG PO TABS
500.0000 mg | ORAL_TABLET | Freq: Every day | ORAL | Status: DC
  Administered 2021-01-04 – 2021-01-08 (×4): 500 mg via ORAL
  Filled 2021-01-04 (×5): qty 1

## 2021-01-04 MED ORDER — CEFAZOLIN SODIUM-DEXTROSE 1-4 GM/50ML-% IV SOLN
1.0000 g | Freq: Four times a day (QID) | INTRAVENOUS | Status: DC
  Filled 2021-01-04 (×2): qty 50

## 2021-01-04 MED ORDER — CEFAZOLIN SODIUM-DEXTROSE 2-4 GM/100ML-% IV SOLN
2.0000 g | INTRAVENOUS | Status: AC
  Administered 2021-01-04: 2 g via INTRAVENOUS

## 2021-01-04 MED ORDER — OXYCODONE HCL 5 MG/5ML PO SOLN: 5.0000 mg | Freq: Once | ORAL | Status: DC | PRN

## 2021-01-04 MED ORDER — 0.9 % SODIUM CHLORIDE (POUR BTL) OPTIME
TOPICAL | Status: DC | PRN
  Administered 2021-01-04: 1000 mL

## 2021-01-04 MED ORDER — CHLORHEXIDINE GLUCONATE 4 % EX LIQD
60.0000 mL | Freq: Once | CUTANEOUS | Status: AC
  Administered 2021-01-04: 4 via TOPICAL
  Filled 2021-01-04: qty 60

## 2021-01-04 MED ORDER — OXYCODONE HCL 5 MG PO TABS: 5.0000 mg | ORAL_TABLET | Freq: Once | ORAL | Status: DC | PRN

## 2021-01-04 MED ORDER — DEXAMETHASONE SODIUM PHOSPHATE 10 MG/ML IJ SOLN
INTRAMUSCULAR | Status: AC
  Filled 2021-01-04: qty 1

## 2021-01-04 MED ORDER — FENTANYL CITRATE (PF) 250 MCG/5ML IJ SOLN
INTRAMUSCULAR | Status: DC | PRN
  Administered 2021-01-04 (×2): 50 ug via INTRAVENOUS

## 2021-01-04 MED ORDER — MIDAZOLAM HCL 2 MG/2ML IJ SOLN
INTRAMUSCULAR | Status: DC | PRN
  Administered 2021-01-04 (×2): 1 mg via INTRAVENOUS

## 2021-01-04 MED ORDER — FENTANYL CITRATE (PF) 250 MCG/5ML IJ SOLN
INTRAMUSCULAR | Status: AC
  Filled 2021-01-04: qty 5

## 2021-01-04 MED ORDER — FENTANYL CITRATE (PF) 100 MCG/2ML IJ SOLN: 25.0000 ug | INTRAMUSCULAR | Status: DC | PRN

## 2021-01-04 MED ORDER — CEFAZOLIN SODIUM-DEXTROSE 2-4 GM/100ML-% IV SOLN
INTRAVENOUS | Status: AC
  Filled 2021-01-04: qty 100

## 2021-01-04 MED ORDER — POVIDONE-IODINE 10 % EX SWAB
2.0000 "application " | Freq: Once | CUTANEOUS | Status: AC
  Administered 2021-01-04: 2 via TOPICAL

## 2021-01-04 MED ORDER — ACETAMINOPHEN 500 MG PO TABS: 1000.0000 mg | ORAL_TABLET | Freq: Once | ORAL | Status: DC

## 2021-01-04 MED ORDER — SUGAMMADEX SODIUM 200 MG/2ML IV SOLN
INTRAVENOUS | Status: DC | PRN
  Administered 2021-01-04: 200 mg via INTRAVENOUS

## 2021-01-04 MED ORDER — GLYCOPYRROLATE PF 0.2 MG/ML IJ SOSY
PREFILLED_SYRINGE | INTRAMUSCULAR | Status: AC
  Filled 2021-01-04: qty 1

## 2021-01-04 MED ORDER — PHENYLEPHRINE 40 MCG/ML (10ML) SYRINGE FOR IV PUSH (FOR BLOOD PRESSURE SUPPORT)
PREFILLED_SYRINGE | INTRAVENOUS | Status: DC | PRN
Start: 2021-01-04 — End: 2021-01-04
  Administered 2021-01-04: 40 ug via INTRAVENOUS
  Administered 2021-01-04: 80 ug via INTRAVENOUS
  Administered 2021-01-04: 120 ug via INTRAVENOUS
  Administered 2021-01-04 (×2): 80 ug via INTRAVENOUS

## 2021-01-04 MED ORDER — METOCLOPRAMIDE HCL 5 MG/ML IJ SOLN
5.0000 mg | Freq: Three times a day (TID) | INTRAMUSCULAR | Status: DC | PRN
Start: 2021-01-04 — End: 2021-01-08

## 2021-01-04 MED ORDER — PROPOFOL 10 MG/ML IV BOLUS
INTRAVENOUS | Status: DC | PRN
  Administered 2021-01-04: 150 mg via INTRAVENOUS

## 2021-01-04 MED ORDER — PHENYLEPHRINE 40 MCG/ML (10ML) SYRINGE FOR IV PUSH (FOR BLOOD PRESSURE SUPPORT)
PREFILLED_SYRINGE | INTRAVENOUS | Status: AC
  Filled 2021-01-04: qty 10

## 2021-01-04 MED ORDER — CLONIDINE HCL (ANALGESIA) 100 MCG/ML EP SOLN
EPIDURAL | Status: DC | PRN
  Administered 2021-01-04: 67 ug
  Administered 2021-01-04: 33 ug

## 2021-01-04 MED ORDER — OXYCODONE HCL 5 MG PO TABS
5.0000 mg | ORAL_TABLET | ORAL | Status: DC | PRN
  Administered 2021-01-05 (×3): 10 mg via ORAL
  Administered 2021-01-06 (×2): 5 mg via ORAL
  Administered 2021-01-06: 10 mg via ORAL
  Administered 2021-01-06: 5 mg via ORAL
  Administered 2021-01-07 (×3): 10 mg via ORAL
  Administered 2021-01-07 – 2021-01-08 (×2): 5 mg via ORAL
  Filled 2021-01-04: qty 1
  Filled 2021-01-04 (×2): qty 2
  Filled 2021-01-04: qty 1
  Filled 2021-01-04 (×2): qty 2
  Filled 2021-01-04 (×2): qty 1
  Filled 2021-01-04 (×4): qty 2
  Filled 2021-01-04: qty 1
  Filled 2021-01-04: qty 2

## 2021-01-04 MED ORDER — VITAMIN D 25 MCG (1000 UNIT) PO TABS
2000.0000 [IU] | ORAL_TABLET | Freq: Two times a day (BID) | ORAL | Status: DC
  Administered 2021-01-04 – 2021-01-08 (×7): 2000 [IU] via ORAL
  Filled 2021-01-04 (×8): qty 2

## 2021-01-04 MED ORDER — ONDANSETRON HCL 4 MG/2ML IJ SOLN: 4.0000 mg | Freq: Four times a day (QID) | INTRAMUSCULAR | Status: DC | PRN

## 2021-01-04 MED ORDER — ACETAMINOPHEN 325 MG PO TABS: 325.0000 mg | ORAL_TABLET | Freq: Four times a day (QID) | ORAL | Status: DC | PRN

## 2021-01-04 MED ORDER — PHENYLEPHRINE HCL-NACL 20-0.9 MG/250ML-% IV SOLN
INTRAVENOUS | Status: DC | PRN
  Administered 2021-01-04: 50 ug/min via INTRAVENOUS

## 2021-01-04 MED ORDER — ROCURONIUM BROMIDE 10 MG/ML (PF) SYRINGE
PREFILLED_SYRINGE | INTRAVENOUS | Status: DC | PRN
Start: 2021-01-04 — End: 2021-01-04
  Administered 2021-01-04: 20 mg via INTRAVENOUS
  Administered 2021-01-04: 60 mg via INTRAVENOUS

## 2021-01-04 MED ORDER — ONDANSETRON HCL 4 MG PO TABS: 4.0000 mg | ORAL_TABLET | Freq: Four times a day (QID) | ORAL | Status: DC | PRN

## 2021-01-04 MED ORDER — ACETAMINOPHEN 500 MG PO TABS
1000.0000 mg | ORAL_TABLET | Freq: Once | ORAL | Status: AC
  Administered 2021-01-04: 1000 mg via ORAL
  Filled 2021-01-04 (×2): qty 2

## 2021-01-04 MED ORDER — KETOROLAC TROMETHAMINE 0.5 % OP SOLN: 1.0000 [drp] | Freq: Three times a day (TID) | OPHTHALMIC | Status: DC | PRN

## 2021-01-04 MED ORDER — VANCOMYCIN HCL 1000 MG IV SOLR: INTRAVENOUS | Status: DC | PRN

## 2021-01-04 MED ORDER — ACETAMINOPHEN 500 MG PO TABS
500.0000 mg | ORAL_TABLET | Freq: Four times a day (QID) | ORAL | Status: DC
  Administered 2021-01-04 – 2021-01-08 (×15): 500 mg via ORAL
  Filled 2021-01-04 (×15): qty 1

## 2021-01-04 MED ORDER — DEXAMETHASONE SODIUM PHOSPHATE 10 MG/ML IJ SOLN
INTRAMUSCULAR | Status: DC | PRN
  Administered 2021-01-04: 10 mg via INTRAVENOUS

## 2021-01-04 MED ORDER — BUPIVACAINE-EPINEPHRINE (PF) 0.5% -1:200000 IJ SOLN
INTRAMUSCULAR | Status: DC | PRN
  Administered 2021-01-04: 20 mL via PERINEURAL
  Administered 2021-01-04: 10 mL via PERINEURAL

## 2021-01-04 MED ORDER — ROCURONIUM BROMIDE 10 MG/ML (PF) SYRINGE
PREFILLED_SYRINGE | INTRAVENOUS | Status: AC
  Filled 2021-01-04: qty 20

## 2021-01-04 MED ORDER — PROPOFOL 500 MG/50ML IV EMUL
INTRAVENOUS | Status: DC | PRN
  Administered 2021-01-04: 25 ug/kg/min via INTRAVENOUS

## 2021-01-04 MED ORDER — PROPOFOL 1000 MG/100ML IV EMUL
INTRAVENOUS | Status: AC
  Filled 2021-01-04: qty 100

## 2021-01-04 MED ORDER — ENOXAPARIN SODIUM 40 MG/0.4ML IJ SOSY
40.0000 mg | PREFILLED_SYRINGE | INTRAMUSCULAR | Status: DC
  Administered 2021-01-05 – 2021-01-08 (×4): 40 mg via SUBCUTANEOUS
  Filled 2021-01-04 (×4): qty 0.4

## 2021-01-04 MED ORDER — METHOCARBAMOL 500 MG PO TABS
500.0000 mg | ORAL_TABLET | Freq: Four times a day (QID) | ORAL | Status: AC | PRN
  Administered 2021-01-04 (×2): 500 mg via ORAL
  Filled 2021-01-04 (×2): qty 1

## 2021-01-04 MED ORDER — LIDOCAINE 2% (20 MG/ML) 5 ML SYRINGE
INTRAMUSCULAR | Status: DC | PRN
  Administered 2021-01-04: 80 mg via INTRAVENOUS

## 2021-01-04 SURGICAL SUPPLY — 106 items
BAG COUNTER SPONGE SURGICOUNT (BAG) ×4 IMPLANT
BAG SPNG CNTER NS LX DISP (BAG) ×3
BANDAGE ESMARK 6X9 LF (GAUZE/BANDAGES/DRESSINGS) ×2 IMPLANT
BIT DRILL 2.0X130 SLD AO (BIT) ×2 IMPLANT
BIT DRILL CANN LONG 4.6X220 (DRILL) ×1 IMPLANT
BIT DRILL CANNULTD 2.6 X 130MM (DRILL) ×1 IMPLANT
BNDG CMPR 9X6 STRL LF SNTH (GAUZE/BANDAGES/DRESSINGS)
BNDG COHESIVE 4X5 TAN STRL (GAUZE/BANDAGES/DRESSINGS) ×2 IMPLANT
BNDG COHESIVE 6X5 TAN STRL LF (GAUZE/BANDAGES/DRESSINGS) IMPLANT
BNDG ELASTIC 4X5.8 VLCR STR LF (GAUZE/BANDAGES/DRESSINGS) ×4 IMPLANT
BNDG ELASTIC 6X5.8 VLCR STR LF (GAUZE/BANDAGES/DRESSINGS) ×4 IMPLANT
BNDG ESMARK 6X9 LF (GAUZE/BANDAGES/DRESSINGS)
BNDG GAUZE ELAST 4 BULKY (GAUZE/BANDAGES/DRESSINGS) ×4 IMPLANT
BRUSH SCRUB EZ PLAIN DRY (MISCELLANEOUS) ×8 IMPLANT
CANISTER WOUNDNEG PRESSURE 500 (CANNISTER) ×2 IMPLANT
COVER MAYO STAND STRL (DRAPES) ×4 IMPLANT
COVER SURGICAL LIGHT HANDLE (MISCELLANEOUS) ×8 IMPLANT
CUFF TOURN SGL QUICK 18X4 (TOURNIQUET CUFF) IMPLANT
CUFF TOURN SGL QUICK 34 (TOURNIQUET CUFF) ×4
CUFF TRNQT CYL 34X4.125X (TOURNIQUET CUFF) ×3 IMPLANT
DRAPE C-ARM 42X72 X-RAY (DRAPES) ×4 IMPLANT
DRAPE C-ARMOR (DRAPES) ×4 IMPLANT
DRAPE DERMATAC (DRAPES) ×6 IMPLANT
DRAPE HALF SHEET 40X57 (DRAPES) ×4 IMPLANT
DRAPE U-SHAPE 47X51 STRL (DRAPES) ×4 IMPLANT
DRESSING PREVENA PLUS CUSTOM (GAUZE/BANDAGES/DRESSINGS) ×1 IMPLANT
DRILL CANN LONG 4.6X220 (DRILL) ×4
DRILL CANNULATED 2.6 X 130MM (DRILL) ×4
DRSG ADAPTIC 3X8 NADH LF (GAUZE/BANDAGES/DRESSINGS) ×2 IMPLANT
DRSG EMULSION OIL 3X3 NADH (GAUZE/BANDAGES/DRESSINGS) IMPLANT
DRSG MEPITEL 4X7.2 (GAUZE/BANDAGES/DRESSINGS) ×2 IMPLANT
DRSG PREVENA PLUS CUSTOM (GAUZE/BANDAGES/DRESSINGS) ×4
ELECT REM PT RETURN 9FT ADLT (ELECTROSURGICAL) ×4
ELECTRODE REM PT RTRN 9FT ADLT (ELECTROSURGICAL) ×3 IMPLANT
EVACUATOR 1/8 PVC DRAIN (DRAIN) IMPLANT
GAUZE SPONGE 4X4 12PLY STRL (GAUZE/BANDAGES/DRESSINGS) ×6 IMPLANT
GLOVE SRG 8 PF TXTR STRL LF DI (GLOVE) ×3 IMPLANT
GLOVE SURG ENC MOIS LTX SZ7.5 (GLOVE) ×4 IMPLANT
GLOVE SURG ENC MOIS LTX SZ8 (GLOVE) ×4 IMPLANT
GLOVE SURG UNDER POLY LF SZ7.5 (GLOVE) ×4 IMPLANT
GLOVE SURG UNDER POLY LF SZ8 (GLOVE) ×4
GLOVE SURG UNDER POLY LF SZ9 (GLOVE) ×4 IMPLANT
GOWN STRL REUS W/ TWL LRG LVL3 (GOWN DISPOSABLE) ×6 IMPLANT
GOWN STRL REUS W/ TWL XL LVL3 (GOWN DISPOSABLE) ×3 IMPLANT
GOWN STRL REUS W/TWL LRG LVL3 (GOWN DISPOSABLE) ×8
GOWN STRL REUS W/TWL XL LVL3 (GOWN DISPOSABLE) ×4
HANDPIECE INTERPULSE COAX TIP (DISPOSABLE)
K-WIRE SNGL END 1.2X150 (MISCELLANEOUS) ×8
KIT BASIN OR (CUSTOM PROCEDURE TRAY) ×4 IMPLANT
KIT TURNOVER KIT B (KITS) ×4 IMPLANT
KWIRE SNGL END 1.2X150 (MISCELLANEOUS) ×2 IMPLANT
MANIFOLD NEPTUNE II (INSTRUMENTS) ×4 IMPLANT
NDL HYPO 21X1.5 SAFETY (NEEDLE) IMPLANT
NEEDLE 22X1 1/2 (OR ONLY) (NEEDLE) IMPLANT
NEEDLE HYPO 21X1.5 SAFETY (NEEDLE) IMPLANT
NS IRRIG 1000ML POUR BTL (IV SOLUTION) ×4 IMPLANT
PACK GENERAL/GYN (CUSTOM PROCEDURE TRAY) ×4 IMPLANT
PACK ORTHO EXTREMITY (CUSTOM PROCEDURE TRAY) ×4 IMPLANT
PAD ABD 7.5X8 STRL (GAUZE/BANDAGES/DRESSINGS) ×4 IMPLANT
PAD ABD 8X10 STRL (GAUZE/BANDAGES/DRESSINGS) ×2 IMPLANT
PAD ARMBOARD 7.5X6 YLW CONV (MISCELLANEOUS) ×8 IMPLANT
PAD CAST 4YDX4 CTTN HI CHSV (CAST SUPPLIES) ×1 IMPLANT
PADDING CAST ABS 6INX4YD NS (CAST SUPPLIES) ×1
PADDING CAST ABS COTTON 6X4 NS (CAST SUPPLIES) ×1 IMPLANT
PADDING CAST COTTON 4X4 STRL (CAST SUPPLIES) ×4
PADDING CAST COTTON 6X4 STRL (CAST SUPPLIES) ×4 IMPLANT
PLATE DIST TIB 6H LT (Plate) ×2 IMPLANT
PLATE FIB GORILLA 13H LT (Plate) ×2 IMPLANT
SCREW 3.5X22 (Screw) ×2 IMPLANT
SCREW 4.0X28 STRL (Screw) ×4 IMPLANT
SCREW LOCK PLATE R3 2.7X14 (Screw) ×4 IMPLANT
SCREW LOCK PLATE R3 2.7X15 (Screw) ×2 IMPLANT
SCREW LOCK PLATE R3 3.5X10 (Screw) ×2 IMPLANT
SCREW LOCK PLATE R3 3.5X12 (Screw) ×4 IMPLANT
SCREW LOCK PLATE R3 3.5X36 (Screw) ×12 IMPLANT
SCREW LOCK PLT 36X3.5X GRLL (Screw) ×1 IMPLANT
SCREW NON LOCKING 2.7X18 (Screw) ×2 IMPLANT
SCREW NON LOCKING 3.5X12 (Screw) ×4 IMPLANT
SCREW NONLOCKING 3.5X24 (Screw) ×2 IMPLANT
SCREW PLT 36X3.5X NONLOCK (Screw) ×2 IMPLANT
SCREW R3CON N/L PLATE 3.5X44 (Screw) ×2 IMPLANT
SCREW RE3CON NL 3.5X30 (Screw) ×2 IMPLANT
SET HNDPC FAN SPRY TIP SCT (DISPOSABLE) IMPLANT
SOL PREP POV-IOD 4OZ 10% (MISCELLANEOUS) ×4 IMPLANT
SOL PREP PROV IODINE SCRUB 4OZ (MISCELLANEOUS) ×4 IMPLANT
SPONGE T-LAP 18X18 ~~LOC~~+RFID (SPONGE) ×4 IMPLANT
STAPLER VISISTAT 35W (STAPLE) IMPLANT
STOCKINETTE IMPERVIOUS 9X36 MD (GAUZE/BANDAGES/DRESSINGS) IMPLANT
STOCKINETTE IMPERVIOUS LG (DRAPES) IMPLANT
STRIP CLOSURE SKIN 1/2X4 (GAUZE/BANDAGES/DRESSINGS) IMPLANT
SUCTION FRAZIER HANDLE 10FR (MISCELLANEOUS) ×1
SUCTION TUBE FRAZIER 10FR DISP (MISCELLANEOUS) ×3 IMPLANT
SUT ETHILON 2 0 FS 18 (SUTURE) ×8 IMPLANT
SUT ETHILON 2 0 PSLX (SUTURE) ×4 IMPLANT
SUT PDS AB 2-0 CT1 27 (SUTURE) ×4 IMPLANT
SUT VIC AB 1 CT1 36 (SUTURE) ×2 IMPLANT
SUT VIC AB 1 CTX 36 (SUTURE) ×4
SUT VIC AB 1 CTX36XBRD ANBCTRL (SUTURE) ×1 IMPLANT
SUT VIC AB 2-0 CT1 27 (SUTURE) ×8
SUT VIC AB 2-0 CT1 TAPERPNT 27 (SUTURE) ×6 IMPLANT
TOWEL GREEN STERILE (TOWEL DISPOSABLE) ×8 IMPLANT
TOWEL GREEN STERILE FF (TOWEL DISPOSABLE) ×8 IMPLANT
TUBE CONNECTING 12X1/4 (SUCTIONS) ×4 IMPLANT
UNDERPAD 30X36 HEAVY ABSORB (UNDERPADS AND DIAPERS) ×4 IMPLANT
WATER STERILE IRR 1000ML POUR (IV SOLUTION) ×4 IMPLANT
YANKAUER SUCT BULB TIP NO VENT (SUCTIONS) ×4 IMPLANT

## 2021-01-04 NOTE — Transfer of Care (Signed)
Immediate Anesthesia Transfer of Care Note  Patient: Hannah Tucker  Procedure(s) Performed: IRRIGATION AND DEBRIDEMENT LEFT ANKLE (Left: Ankle) OPEN REDUCTION INTERNAL FIXATION (ORIF) ANKLE FRACTURE (Left: Ankle) APPLICATION OF WOUND VAC (Right: Leg Lower)  Patient Location: PACU  Anesthesia Type:GA combined with regional for post-op pain  Level of Consciousness: awake, alert  and oriented  Airway & Oxygen Therapy: Patient Spontanous Breathing and Patient connected to nasal cannula oxygen  Post-op Assessment: Report given to RN and Post -op Vital signs reviewed and stable  Post vital signs: Reviewed and stable  Last Vitals:  Vitals Value Taken Time  BP 125/58 01/04/21 1219  Temp    Pulse 78 01/04/21 1221  Resp 14 01/04/21 1221  SpO2 92 % 01/04/21 1221  Vitals shown include unvalidated device data.  Last Pain:  Vitals:   01/04/21 0748  TempSrc: Oral  PainSc: 5          Complications: No notable events documented.

## 2021-01-04 NOTE — Anesthesia Procedure Notes (Signed)
Procedure Name: Intubation Date/Time: 01/04/2021 8:38 AM Performed by: Marena Chancy, CRNA Pre-anesthesia Checklist: Patient identified, Emergency Drugs available, Suction available and Patient being monitored Patient Re-evaluated:Patient Re-evaluated prior to induction Oxygen Delivery Method: Circle System Utilized Preoxygenation: Pre-oxygenation with 100% oxygen Induction Type: IV induction Ventilation: Mask ventilation without difficulty Laryngoscope Size: Miller and 2 Grade View: Grade II Tube type: Oral Tube size: 7.0 mm Number of attempts: 1 Airway Equipment and Method: Stylet and Oral airway Placement Confirmation: ETT inserted through vocal cords under direct vision, positive ETCO2 and breath sounds checked- equal and bilateral Tube secured with: Tape Dental Injury: Teeth and Oropharynx as per pre-operative assessment
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
## 2021-01-04 NOTE — Consult Note (Signed)
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   I.V. (mL/kg) 300 (3.7)    Total Intake(mL/kg) 300 (3.7)    Urine (mL/kg/hr) 600    Total Output 600    Net -300            ROS No recent fever, bleeding abnormalities, urologic dysfunction, GI problems, or weight gain. Blood pressure (!) 164/71, pulse 86, temperature 98.1 F (36.7 C), temperature source Oral, resp. rate 17, height 5\' 1"  (1.549 m), weight 81.6 kg, SpO2 95 %. Physical Exam NCAT, very pleasant LUEx shoulder, elbow, wrist, digits- no skin wounds, nontender, no instability, no blocks to motion  Sens  Ax/R/M/U intact  Mot   Ax/ R/ PIN/ M/ AIN/ U intact  Rad 2+ RUEx shoulder, elbow, wrist, digits- no skin wounds, nontender, no instability, no blocks to motion  Sens  Ax/R/M/U intact  Mot   Ax/ R/ PIN/ M/ AIN/ U intact  Rad 2+ LLE Mild sanguinous drainage top of TKA incision  Able to  perform straight leg raise  Appropriately tender  No knee or ankle effusion  Knee stable to varus/ valgus and anterior/posterior stress  Sens DPN, SPN, TN intact  Motor EHL, ext, flex, evers 5/5  DP 2+, PT 2+, No significant edema  Assessment/Plan:  Type 2 or 3 open trimall fracture dislocation, left Bleeding right TKA wound without loss of extensor function No evidence of blood loss anemia at this time  I discussed with the patient the risks and benefits of surgery, including the possibility of infection, nerve injury, vessel injury, wound breakdown, arthritis, symptomatic hardware, DVT/ PE, loss of motion, malunion, nonunion, and need for further surgery among others.  We also specifically discussed the need to stage surgery because of the elevated risk of soft tissue breakdown that could lead to amputation.  She acknowledged these risks and wished to proceed.   Altamese [place], MD [facility], [facility] [phone]  01/04/2021, 8:10 AM  [facility] [place] [address] [phone] Jenetta [phone] (F)    After 5pm and on the weekends please log on to Amion, go to orthopaedics and the look under the [facility] Call for the provider(s) on call. You can also call our office at [phone] and then follow the prompts to be connected to the call team.
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
## 2021-01-04 NOTE — TOC CAGE-AID Note (Signed)
Transition of Care [facility]) - CAGE-AID Screening   Patient Details  Name: Hannah Tucker MRN: [id number] Date of Birth: June 05, 1940  Transition of Care [facility]) CM/SW Contact:    Dyson Sevey C Tarpley-Carter, LCSWA Phone Number: 01/04/2021, 2:07 PM   Clinical Narrative: Pt is unable to participate in Cage Aid.  Redina Zeller Tarpley-Carter, MSW, LCSW-A Pronouns:  She/Her/Hers [facility]itions of Care Clinical Social Worker Direct Number:  [phone] [email or web address]  CAGE-AID Screening: Substance Abuse Screening unable to be completed due to: : Patient unable to participate             Substance Abuse Education Offered: No

## 2021-01-04 NOTE — Anesthesia Postprocedure Evaluation (Signed)
Anesthesia Post Note  Patient: Hannah Tucker  Procedure(s) Performed: IRRIGATION AND DEBRIDEMENT LEFT ANKLE (Left: Ankle) OPEN REDUCTION INTERNAL FIXATION (ORIF) ANKLE FRACTURE (Left: Ankle) APPLICATION OF WOUND VAC (Right: Leg Lower)     Patient location during evaluation: PACU Anesthesia Type: General Level of consciousness: awake and alert and oriented Pain management: pain level controlled Vital Signs Assessment: post-procedure vital signs reviewed and stable Respiratory status: spontaneous breathing, nonlabored ventilation and respiratory function stable Cardiovascular status: blood pressure returned to baseline Postop Assessment: no apparent nausea or vomiting Anesthetic complications: no   No notable events documented.  Last Vitals:  Vitals:   01/04/21 1235 01/04/21 1250  BP: 138/64 (!) 142/71  Pulse: 77 79  Resp: 15 13  Temp:  (!) 36.1 C  SpO2: 96% 95%    Last Pain:  Vitals:   01/04/21 1250  TempSrc:   PainSc: 0-No pain                 Shanda Howells

## 2021-01-04 NOTE — Anesthesia Procedure Notes (Signed)
Anesthesia Regional Block: Adductor canal block   Pre-Anesthetic Checklist: , timeout performed,  Correct Patient, Correct Site, Correct Laterality,  Correct Procedure, Correct Position, site marked,  Risks and benefits discussed,  Pre-op evaluation,  At surgeon's request and post-op pain management  Laterality: Left  Prep: Maximum Sterile Barrier Precautions used, chloraprep       Needles:  Injection technique: Single-shot  Needle Type: Echogenic Stimulator Needle     Needle Length: 9cm  Needle Gauge: 22     Additional Needles:   Procedures:,,,, ultrasound used (permanent image in chart),,    Narrative:  Start time: 01/04/2021 8:12 AM End time: 01/04/2021 8:15 AM Injection made incrementally with aspirations every 5 mL.  Performed by: Personally  Anesthesiologist: Kaylyn Layer, MD  Additional Notes: Risks, benefits, and alternative discussed. Patient gave consent for procedure. Patient prepped and draped in sterile fashion. Sedation administered, patient remains easily responsive to voice. Relevant anatomy identified with ultrasound guidance. Local anesthetic given in 5cc increments with no signs or symptoms of intravascular injection. No pain or paraesthesias with injection. Patient monitored throughout procedure with signs of LAST or immediate complications. Tolerated well. Ultrasound image placed in chart.  Amalia Greenhouse, MD

## 2021-01-04 NOTE — Anesthesia Procedure Notes (Signed)
Anesthesia Regional Block: Popliteal block   Pre-Anesthetic Checklist: , timeout performed,  Correct Patient, Correct Site, Correct Laterality,  Correct Procedure, Correct Position, site marked,  Risks and benefits discussed,  Pre-op evaluation,  At surgeon's request and post-op pain management  Laterality: Left  Prep: Maximum Sterile Barrier Precautions used, chloraprep       Needles:  Injection technique: Single-shot  Needle Type: Echogenic Stimulator Needle     Needle Length: 9cm  Needle Gauge: 22     Additional Needles:   Procedures:,,,, ultrasound used (permanent image in chart),,    Narrative:  Start time: 01/04/2021 8:14 AM End time: 01/04/2021 8:17 AM Injection made incrementally with aspirations every 5 mL.  Performed by: Personally  Anesthesiologist: Kaylyn Layer, MD  Additional Notes: Risks, benefits, and alternative discussed. Patient gave consent for procedure. Patient prepped and draped in sterile fashion. Sedation administered, patient remains easily responsive to voice. Relevant anatomy identified with ultrasound guidance. Local anesthetic given in 5cc increments with no signs or symptoms of intravascular injection. No pain or paraesthesias with injection. Patient monitored throughout procedure with signs of LAST or immediate complications. Tolerated well. Ultrasound image placed in chart.  Amalia Greenhouse, MD

## 2021-01-04 NOTE — Care Management (Addendum)
Son DR Zena Amos JR   [phone], would MD to call him . NCM messaged attending and ortho.   1220 Dr Carola Frost called son, call went to voicemail
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
## 2021-01-04 NOTE — Progress Notes (Signed)
Inpatient Rehab Admissions Coordinator Note:   Per therapy patient was screened for CIR candidacy by Stephania Fragmin, PT. At this time, pt appears to be a potential candidate for CIR. I will place an order for rehab consult for full assessment, per our protocol.  Please contact me any with questions.Estill Dooms, PT, DPT [phone] 01/04/21 5:27 PM

## 2021-01-05 DIAGNOSIS — S82892B Other fracture of left lower leg, initial encounter for open fracture type I or II: Secondary | ICD-10-CM | POA: Diagnosis not present

## 2021-01-05 LAB — CBC WITH DIFFERENTIAL/PLATELET
Abs Immature Granulocytes: 0.03 10*3/uL (ref 0.00–0.07)
Basophils Absolute: 0 10*3/uL (ref 0.0–0.1)
Basophils Relative: 0 %
Eosinophils Absolute: 0 10*3/uL (ref 0.0–0.5)
Eosinophils Relative: 0 %
HCT: 27.2 % — ABNORMAL LOW (ref 36.0–46.0)
Hemoglobin: 9.3 g/dL — ABNORMAL LOW (ref 12.0–15.0)
Immature Granulocytes: 0 %
Lymphocytes Relative: 13 %
Lymphs Abs: 1 10*3/uL (ref 0.7–4.0)
MCH: 28 pg (ref 26.0–34.0)
MCHC: 34.2 g/dL (ref 30.0–36.0)
MCV: 81.9 fL (ref 80.0–100.0)
Monocytes Absolute: 0.9 10*3/uL (ref 0.1–1.0)
Monocytes Relative: 11 %
Neutro Abs: 5.8 10*3/uL (ref 1.7–7.7)
Neutrophils Relative %: 76 %
Platelets: 230 10*3/uL (ref 150–400)
RBC: 3.32 MIL/uL — ABNORMAL LOW (ref 3.87–5.11)
RDW: 14.5 % (ref 11.5–15.5)
WBC: 7.6 10*3/uL (ref 4.0–10.5)
nRBC: 0 % (ref 0.0–0.2)

## 2021-01-05 LAB — BASIC METABOLIC PANEL
Anion gap: 5 (ref 5–15)
BUN: 7 mg/dL — ABNORMAL LOW (ref 8–23)
CO2: 27 mmol/L (ref 22–32)
Calcium: 8.1 mg/dL — ABNORMAL LOW (ref 8.9–10.3)
Chloride: 107 mmol/L (ref 98–111)
Creatinine, Ser: 0.73 mg/dL (ref 0.44–1.00)
GFR, Estimated: >60 mL/min (ref >60)
Glucose, Bld: 107 mg/dL — ABNORMAL HIGH (ref 70–99)
Potassium: 3.4 mmol/L — ABNORMAL LOW (ref 3.5–5.1)
Sodium: 139 mmol/L (ref 135–145)

## 2021-01-05 MED ORDER — METHOCARBAMOL 500 MG PO TABS
500.0000 mg | ORAL_TABLET | Freq: Three times a day (TID) | ORAL | Status: DC | PRN
  Administered 2021-01-05 – 2021-01-08 (×6): 500 mg via ORAL
  Filled 2021-01-05 (×6): qty 1

## 2021-01-05 MED ORDER — POTASSIUM CHLORIDE CRYS ER 20 MEQ PO TBCR
40.0000 meq | EXTENDED_RELEASE_TABLET | Freq: Once | ORAL | Status: AC
  Administered 2021-01-05: 40 meq via ORAL
  Filled 2021-01-05: qty 2

## 2021-01-05 MED ORDER — ENSURE ENLIVE PO LIQD
237.0000 mL | Freq: Two times a day (BID) | ORAL | Status: DC
  Administered 2021-01-05 – 2021-01-06 (×4): 237 mL via ORAL

## 2021-01-05 NOTE — Progress Notes (Signed)
Inpatient Rehab Admissions:  Inpatient Rehab Consult received.  I met with patient and husband at the bedside for rehabilitation assessment and to discuss goals and expectations of an inpatient rehab admission.  Both acknowledged understanding of goals and expectations. Both interested in pt pursuing CIR. Pt/husband confirmed family support (children) after discharge from hospital. Will continue to follow.  Signed: Gayland Curry, [place], [place] Admissions Coordinator [phone]

## 2021-01-05 NOTE — Progress Notes (Signed)
Patient refused the CPAP tonight.  
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
## 2021-01-06 DIAGNOSIS — S82892B Other fracture of left lower leg, initial encounter for open fracture type I or II: Secondary | ICD-10-CM | POA: Diagnosis not present

## 2021-01-06 LAB — BASIC METABOLIC PANEL WITH GFR
Anion gap: 5 (ref 5–15)
BUN: 12 mg/dL (ref 8–23)
CO2: 27 mmol/L (ref 22–32)
Calcium: 8.1 mg/dL — ABNORMAL LOW (ref 8.9–10.3)
Chloride: 105 mmol/L (ref 98–111)
Creatinine, Ser: 0.76 mg/dL (ref 0.44–1.00)
GFR, Estimated: >60 mL/min
Glucose, Bld: 96 mg/dL (ref 70–99)
Potassium: 3.6 mmol/L (ref 3.5–5.1)
Sodium: 137 mmol/L (ref 135–145)

## 2021-01-06 LAB — CBC WITH DIFFERENTIAL/PLATELET
Abs Immature Granulocytes: 0.06 K/uL (ref 0.00–0.07)
Basophils Absolute: 0 K/uL (ref 0.0–0.1)
Basophils Relative: 0 %
Eosinophils Absolute: 0.4 K/uL (ref 0.0–0.5)
Eosinophils Relative: 5 %
HCT: 28 % — ABNORMAL LOW (ref 36.0–46.0)
Hemoglobin: 8.8 g/dL — ABNORMAL LOW (ref 12.0–15.0)
Immature Granulocytes: 1 %
Lymphocytes Relative: 22 %
Lymphs Abs: 1.7 K/uL (ref 0.7–4.0)
MCH: 26.7 pg (ref 26.0–34.0)
MCHC: 31.4 g/dL (ref 30.0–36.0)
MCV: 84.8 fL (ref 80.0–100.0)
Monocytes Absolute: 0.8 K/uL (ref 0.1–1.0)
Monocytes Relative: 10 %
Neutro Abs: 4.9 K/uL (ref 1.7–7.7)
Neutrophils Relative %: 62 %
Platelets: 257 K/uL (ref 150–400)
RBC: 3.3 MIL/uL — ABNORMAL LOW (ref 3.87–5.11)
RDW: 14.7 % (ref 11.5–15.5)
WBC: 7.9 K/uL (ref 4.0–10.5)
nRBC: 0 % (ref 0.0–0.2)
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
## 2021-01-06 NOTE — Progress Notes (Signed)
Pt has refused CPAP for several nights. Pt states she is comfortable just wearing .
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
## 2021-01-07 ENCOUNTER — Inpatient Hospital Stay (HOSPITAL_COMMUNITY): Payer: No Typology Code available for payment source

## 2021-01-07 ENCOUNTER — Encounter (HOSPITAL_COMMUNITY)
Admission: EM | Disposition: A | Discharge status: Rehabilitation Facility | Payer: Self-pay | Source: Home / Self Care | Attending: Internal Medicine

## 2021-01-07 ENCOUNTER — Inpatient Hospital Stay (HOSPITAL_COMMUNITY): Payer: No Typology Code available for payment source | Admitting: Anesthesiology

## 2021-01-07 ENCOUNTER — Encounter (HOSPITAL_COMMUNITY): Payer: Self-pay | Admitting: Family Medicine

## 2021-01-07 DIAGNOSIS — S82892B Other fracture of left lower leg, initial encounter for open fracture type I or II: Secondary | ICD-10-CM | POA: Diagnosis not present

## 2021-01-07 HISTORY — PX: I & D EXTREMITY: SHX5045

## 2021-01-07 HISTORY — PX: ORIF ANKLE FRACTURE: SHX5408

## 2021-01-07 LAB — CBC WITH DIFFERENTIAL/PLATELET
Abs Immature Granulocytes: 0.08 10*3/uL — ABNORMAL HIGH (ref 0.00–0.07)
Basophils Absolute: 0 10*3/uL (ref 0.0–0.1)
Basophils Relative: 0 %
Eosinophils Absolute: 0.4 10*3/uL (ref 0.0–0.5)
Eosinophils Relative: 5 %
HCT: 28 % — ABNORMAL LOW (ref 36.0–46.0)
Hemoglobin: 9.1 g/dL — ABNORMAL LOW (ref 12.0–15.0)
Immature Granulocytes: 1 %
Lymphocytes Relative: 16 %
Lymphs Abs: 1.1 10*3/uL (ref 0.7–4.0)
MCH: 27 pg (ref 26.0–34.0)
MCHC: 32.5 g/dL (ref 30.0–36.0)
MCV: 83.1 fL (ref 80.0–100.0)
Monocytes Absolute: 0.6 10*3/uL (ref 0.1–1.0)
Monocytes Relative: 9 %
Neutro Abs: 4.6 10*3/uL (ref 1.7–7.7)
Neutrophils Relative %: 69 %
Platelets: 259 10*3/uL (ref 150–400)
RBC: 3.37 MIL/uL — ABNORMAL LOW (ref 3.87–5.11)
RDW: 14.5 % (ref 11.5–15.5)
WBC: 6.8 10*3/uL (ref 4.0–10.5)
nRBC: 0 % (ref 0.0–0.2)

## 2021-01-07 LAB — BASIC METABOLIC PANEL
Anion gap: 4 — ABNORMAL LOW (ref 5–15)
BUN: 10 mg/dL (ref 8–23)
CO2: 32 mmol/L (ref 22–32)
Calcium: 8.3 mg/dL — ABNORMAL LOW (ref 8.9–10.3)
Chloride: 103 mmol/L (ref 98–111)
Creatinine, Ser: 0.73 mg/dL (ref 0.44–1.00)
GFR, Estimated: >60 mL/min (ref >60)
Glucose, Bld: 103 mg/dL — ABNORMAL HIGH (ref 70–99)
Potassium: 3.7 mmol/L (ref 3.5–5.1)
Sodium: 139 mmol/L (ref 135–145)

## 2021-01-07 LAB — TYPE AND SCREEN
ABO/RH(D): A NEG
Antibody Screen: NEGATIVE

## 2021-01-07 SURGERY — IRRIGATION AND DEBRIDEMENT EXTREMITY
Anesthesia: Monitor Anesthesia Care | Site: Ankle | Laterality: Left

## 2021-01-07 MED ORDER — ONDANSETRON HCL 4 MG/2ML IJ SOLN: 4.0000 mg | Freq: Once | INTRAMUSCULAR | Status: DC | PRN

## 2021-01-07 MED ORDER — MIDAZOLAM HCL 2 MG/2ML IJ SOLN: 1.0000 mg | Freq: Once | INTRAMUSCULAR | Status: AC

## 2021-01-07 MED ORDER — CHLORHEXIDINE GLUCONATE 0.12 % MT SOLN
OROMUCOSAL | Status: AC
  Filled 2021-01-07: qty 15

## 2021-01-07 MED ORDER — LACTATED RINGERS IV SOLN: INTRAVENOUS | Status: DC

## 2021-01-07 MED ORDER — MIDAZOLAM HCL 2 MG/2ML IJ SOLN
INTRAMUSCULAR | Status: AC
  Administered 2021-01-07: 1 mg via INTRAVENOUS
  Filled 2021-01-07: qty 2

## 2021-01-07 MED ORDER — FENTANYL CITRATE (PF) 250 MCG/5ML IJ SOLN
INTRAMUSCULAR | Status: DC | PRN
  Administered 2021-01-07: 50 ug via INTRAVENOUS

## 2021-01-07 MED ORDER — ONDANSETRON HCL 4 MG/2ML IJ SOLN
INTRAMUSCULAR | Status: AC
  Filled 2021-01-07: qty 2

## 2021-01-07 MED ORDER — ONDANSETRON HCL 4 MG/2ML IJ SOLN
INTRAMUSCULAR | Status: DC | PRN
  Administered 2021-01-07: 4 mg via INTRAVENOUS

## 2021-01-07 MED ORDER — ROPIVACAINE HCL 5 MG/ML IJ SOLN
INTRAMUSCULAR | Status: DC | PRN
  Administered 2021-01-07: 30 mL via PERINEURAL
  Administered 2021-01-07: 15 mL via PERINEURAL

## 2021-01-07 MED ORDER — DEXAMETHASONE SODIUM PHOSPHATE 10 MG/ML IJ SOLN
INTRAMUSCULAR | Status: AC
  Filled 2021-01-07: qty 1

## 2021-01-07 MED ORDER — AMISULPRIDE (ANTIEMETIC) 5 MG/2ML IV SOLN: 10.0000 mg | Freq: Once | INTRAVENOUS | Status: DC | PRN

## 2021-01-07 MED ORDER — LIDOCAINE 2% (20 MG/ML) 5 ML SYRINGE
INTRAMUSCULAR | Status: AC
  Filled 2021-01-07: qty 5

## 2021-01-07 MED ORDER — FENTANYL CITRATE (PF) 100 MCG/2ML IJ SOLN
INTRAMUSCULAR | Status: AC
  Administered 2021-01-07: 50 ug via INTRAVENOUS
  Filled 2021-01-07: qty 2

## 2021-01-07 MED ORDER — 0.9 % SODIUM CHLORIDE (POUR BTL) OPTIME
TOPICAL | Status: DC | PRN
  Administered 2021-01-07: 1000 mL

## 2021-01-07 MED ORDER — PROPOFOL 500 MG/50ML IV EMUL
INTRAVENOUS | Status: DC | PRN
  Administered 2021-01-07: 75 ug/kg/min via INTRAVENOUS

## 2021-01-07 MED ORDER — OXYCODONE HCL 5 MG PO TABS: 5.0000 mg | ORAL_TABLET | Freq: Once | ORAL | Status: DC | PRN

## 2021-01-07 MED ORDER — ORAL CARE MOUTH RINSE: 15.0000 mL | Freq: Once | OROMUCOSAL | Status: AC

## 2021-01-07 MED ORDER — CEFAZOLIN SODIUM-DEXTROSE 2-4 GM/100ML-% IV SOLN
INTRAVENOUS | Status: AC
  Filled 2021-01-07: qty 100

## 2021-01-07 MED ORDER — DEXAMETHASONE SODIUM PHOSPHATE 10 MG/ML IJ SOLN
INTRAMUSCULAR | Status: DC | PRN
  Administered 2021-01-07: 8 mg via INTRAVENOUS

## 2021-01-07 MED ORDER — FENTANYL CITRATE (PF) 100 MCG/2ML IJ SOLN: 50.0000 ug | Freq: Once | INTRAMUSCULAR | Status: AC

## 2021-01-07 MED ORDER — PROPOFOL 10 MG/ML IV BOLUS
INTRAVENOUS | Status: DC | PRN
  Administered 2021-01-07: 30 mg via INTRAVENOUS
  Administered 2021-01-07: 20 mg via INTRAVENOUS

## 2021-01-07 MED ORDER — MORPHINE SULFATE (PF) 2 MG/ML IV SOLN
1.0000 mg | INTRAVENOUS | Status: DC | PRN
  Administered 2021-01-07: 1 mg via INTRAVENOUS
  Filled 2021-01-07: qty 1

## 2021-01-07 MED ORDER — FENTANYL CITRATE (PF) 100 MCG/2ML IJ SOLN: 25.0000 ug | INTRAMUSCULAR | Status: DC | PRN

## 2021-01-07 MED ORDER — FENTANYL CITRATE (PF) 250 MCG/5ML IJ SOLN
INTRAMUSCULAR | Status: AC
  Filled 2021-01-07: qty 5

## 2021-01-07 MED ORDER — CHLORHEXIDINE GLUCONATE 0.12 % MT SOLN: 15.0000 mL | Freq: Once | OROMUCOSAL | Status: AC

## 2021-01-07 MED ORDER — PROPOFOL 10 MG/ML IV BOLUS
INTRAVENOUS | Status: AC
  Filled 2021-01-07: qty 20

## 2021-01-07 MED ORDER — PHENYLEPHRINE 40 MCG/ML (10ML) SYRINGE FOR IV PUSH (FOR BLOOD PRESSURE SUPPORT)
PREFILLED_SYRINGE | INTRAVENOUS | Status: AC
  Filled 2021-01-07: qty 10

## 2021-01-07 MED ORDER — OXYCODONE HCL 5 MG/5ML PO SOLN: 5.0000 mg | Freq: Once | ORAL | Status: DC | PRN

## 2021-01-07 MED ORDER — CEFAZOLIN SODIUM-DEXTROSE 2-4 GM/100ML-% IV SOLN
2.0000 g | Freq: Once | INTRAVENOUS | Status: AC
  Administered 2021-01-07: 2 g via INTRAVENOUS

## 2021-01-07 SURGICAL SUPPLY — 72 items
BAG COUNTER SPONGE SURGICOUNT (BAG) ×3 IMPLANT
BAG SPNG CNTER NS LX DISP (BAG) ×2
BANDAGE ESMARK 6X9 LF (GAUZE/BANDAGES/DRESSINGS) ×2 IMPLANT
BNDG CMPR 9X6 STRL LF SNTH (GAUZE/BANDAGES/DRESSINGS) ×2
BNDG COHESIVE 4X5 TAN STRL (GAUZE/BANDAGES/DRESSINGS) ×3 IMPLANT
BNDG ELASTIC 4X5.8 VLCR STR LF (GAUZE/BANDAGES/DRESSINGS) ×3 IMPLANT
BNDG ELASTIC 6X5.8 VLCR STR LF (GAUZE/BANDAGES/DRESSINGS) ×3 IMPLANT
BNDG ESMARK 6X9 LF (GAUZE/BANDAGES/DRESSINGS) ×3
BNDG GAUZE ELAST 4 BULKY (GAUZE/BANDAGES/DRESSINGS) ×6 IMPLANT
BRUSH SCRUB EZ PLAIN DRY (MISCELLANEOUS) ×6 IMPLANT
COVER MAYO STAND STRL (DRAPES) ×3 IMPLANT
COVER SURGICAL LIGHT HANDLE (MISCELLANEOUS) ×6 IMPLANT
CUFF TOURN SGL QUICK 34 (TOURNIQUET CUFF) ×3
CUFF TRNQT CYL 34X4.125X (TOURNIQUET CUFF) ×2 IMPLANT
DRAPE C-ARM 42X72 X-RAY (DRAPES) ×3 IMPLANT
DRAPE C-ARMOR (DRAPES) ×3 IMPLANT
DRAPE HALF SHEET 40X57 (DRAPES) ×3 IMPLANT
DRAPE U-SHAPE 47X51 STRL (DRAPES) ×3 IMPLANT
DRESSING AQUACEL AG SP 3.5X10 (GAUZE/BANDAGES/DRESSINGS) IMPLANT
DRSG ADAPTIC 3X8 NADH LF (GAUZE/BANDAGES/DRESSINGS) ×3 IMPLANT
DRSG AQUACEL AG ADV 3.5X14 (GAUZE/BANDAGES/DRESSINGS) ×3 IMPLANT
DRSG AQUACEL AG SP 3.5X10 (GAUZE/BANDAGES/DRESSINGS)
DRSG EMULSION OIL 3X3 NADH (GAUZE/BANDAGES/DRESSINGS) IMPLANT
DRSG MEPITEL 8X12 (GAUZE/BANDAGES/DRESSINGS) ×3 IMPLANT
ELECT REM PT RETURN 9FT ADLT (ELECTROSURGICAL) ×3
ELECTRODE REM PT RTRN 9FT ADLT (ELECTROSURGICAL) ×2 IMPLANT
GAUZE SPONGE 4X4 12PLY STRL (GAUZE/BANDAGES/DRESSINGS) ×3 IMPLANT
GLOVE SRG 8 PF TXTR STRL LF DI (GLOVE) ×2 IMPLANT
GLOVE SURG ENC MOIS LTX SZ7.5 (GLOVE) ×3 IMPLANT
GLOVE SURG ENC MOIS LTX SZ8 (GLOVE) ×3 IMPLANT
GLOVE SURG UNDER POLY LF SZ7.5 (GLOVE) ×3 IMPLANT
GLOVE SURG UNDER POLY LF SZ8 (GLOVE) ×3
GLOVE SURG UNDER POLY LF SZ9 (GLOVE) ×3 IMPLANT
GOWN STRL REUS W/ TWL LRG LVL3 (GOWN DISPOSABLE) ×4 IMPLANT
GOWN STRL REUS W/ TWL XL LVL3 (GOWN DISPOSABLE) ×2 IMPLANT
GOWN STRL REUS W/TWL LRG LVL3 (GOWN DISPOSABLE) ×6
GOWN STRL REUS W/TWL XL LVL3 (GOWN DISPOSABLE) ×3
HANDPIECE INTERPULSE COAX TIP (DISPOSABLE)
K-WIRE SNGL END 1.2X150 (MISCELLANEOUS) ×6
KIT BASIN OR (CUSTOM PROCEDURE TRAY) ×3 IMPLANT
KIT TURNOVER KIT B (KITS) ×3 IMPLANT
KWIRE SNGL END 1.2X150 (MISCELLANEOUS) ×4 IMPLANT
MANIFOLD NEPTUNE II (INSTRUMENTS) ×3 IMPLANT
NEEDLE HYPO 21X1.5 SAFETY (NEEDLE) IMPLANT
NS IRRIG 1000ML POUR BTL (IV SOLUTION) ×3 IMPLANT
PACK GENERAL/GYN (CUSTOM PROCEDURE TRAY) ×3 IMPLANT
PACK ORTHO EXTREMITY (CUSTOM PROCEDURE TRAY) ×3 IMPLANT
PAD ARMBOARD 7.5X6 YLW CONV (MISCELLANEOUS) ×6 IMPLANT
PAD CAST 4YDX4 CTTN HI CHSV (CAST SUPPLIES) ×8 IMPLANT
PADDING CAST ABS 3INX4YD NS (CAST SUPPLIES) ×1
PADDING CAST ABS COTTON 3X4 (CAST SUPPLIES) ×2 IMPLANT
PADDING CAST COTTON 4X4 STRL (CAST SUPPLIES) ×12
PADDING CAST COTTON 6X4 STRL (CAST SUPPLIES) ×3 IMPLANT
SET HNDPC FAN SPRY TIP SCT (DISPOSABLE) IMPLANT
SOL PREP POV-IOD 4OZ 10% (MISCELLANEOUS) ×3 IMPLANT
SOL PREP PROV IODINE SCRUB 4OZ (MISCELLANEOUS) ×3 IMPLANT
SPONGE T-LAP 18X18 ~~LOC~~+RFID (SPONGE) ×3 IMPLANT
STAPLER VISISTAT 35W (STAPLE) IMPLANT
STOCKINETTE IMPERVIOUS 9X36 MD (GAUZE/BANDAGES/DRESSINGS) IMPLANT
SUCTION FRAZIER HANDLE 10FR (MISCELLANEOUS) ×1
SUCTION TUBE FRAZIER 10FR DISP (MISCELLANEOUS) ×2 IMPLANT
SUT ETHILON 2 0 FS 18 (SUTURE) ×6 IMPLANT
SUT PDS AB 2-0 CT1 27 (SUTURE) IMPLANT
SUT PDS AB 2-0 CT2 27 (SUTURE) ×3 IMPLANT
SUT VIC AB 2-0 CT1 27 (SUTURE) ×6
SUT VIC AB 2-0 CT1 TAPERPNT 27 (SUTURE) ×4 IMPLANT
TOWEL GREEN STERILE (TOWEL DISPOSABLE) ×6 IMPLANT
TOWEL GREEN STERILE FF (TOWEL DISPOSABLE) ×3 IMPLANT
TUBE CONNECTING 12X1/4 (SUCTIONS) ×3 IMPLANT
UNDERPAD 30X36 HEAVY ABSORB (UNDERPADS AND DIAPERS) ×3 IMPLANT
WATER STERILE IRR 1000ML POUR (IV SOLUTION) ×3 IMPLANT
YANKAUER SUCT BULB TIP NO VENT (SUCTIONS) ×3 IMPLANT

## 2021-01-07 NOTE — Anesthesia Procedure Notes (Signed)
Anesthesia Regional Block: Popliteal block   Pre-Anesthetic Checklist: , timeout performed,  Correct Patient, Correct Site, Correct Laterality,  Correct Procedure, Correct Position, site marked,  Risks and benefits discussed,  Surgical consent,  Pre-op evaluation,  At surgeon's request and post-op pain management  Laterality: Left  Prep: chloraprep       Needles:  Injection technique: Single-shot  Needle Type: Echogenic Stimulator Needle     Needle Length: 10cm  Needle Gauge: 20     Additional Needles:   Procedures:,,,, ultrasound used (permanent image in chart),,    Narrative:  Start time: 01/07/2021 2:30 PM End time: 01/07/2021 2:35 PM  Performed by: Personally  Anesthesiologist: Lucretia Kern, MD  Additional Notes: Standard monitors applied. Skin prepped. Good needle visualization with ultrasound. Injection made in 5cc increments with no resistance to injection. Patient tolerated the procedure well.

## 2021-01-07 NOTE — Progress Notes (Signed)
Inpatient Rehab Admissions Coordinator:  Pt is not medically stable for potential admission to CIR. Pt to return to OR today. Will continue to follow.   Wolfgang Phoenix, MS, CCC-SLP Admissions Coordinator [phone]

## 2021-01-07 NOTE — Anesthesia Procedure Notes (Addendum)
Anesthesia Regional Block: Adductor canal block   Pre-Anesthetic Checklist: , timeout performed,  Correct Patient, Correct Site, Correct Laterality,  Correct Procedure, Correct Position, site marked,  Risks and benefits discussed,  Surgical consent,  Pre-op evaluation,  At surgeon's request and post-op pain management  Laterality: Left  Prep: chloraprep       Needles:  Injection technique: Single-shot  Needle Type: Echogenic Stimulator Needle     Needle Length: 10cm  Needle Gauge: 20     Additional Needles:   Procedures:,,,, ultrasound used (permanent image in chart),,    Narrative:  Start time: 01/07/2021 2:27 PM End time: 01/07/2021 2:30 PM Injection made incrementally with aspirations every 5 mL.  Performed by: Personally  Anesthesiologist: Lucretia Kern, MD  Additional Notes: Standard monitors applied. Skin prepped. Good needle visualization with ultrasound. Injection made in 5cc increments with no resistance to injection. Patient tolerated the procedure well.

## 2021-01-07 NOTE — Anesthesia Procedure Notes (Signed)
Procedure Name: MAC Date/Time: 01/07/2021 3:27 PM Performed by: Jenne Campus, CRNA Pre-anesthesia Checklist: Patient identified, Emergency Drugs available, Suction available and Patient being monitored Oxygen Delivery Method: Simple face mask

## 2021-01-07 NOTE — Progress Notes (Addendum)
I discussed with the patient, and also yesterday evening with her husband and son in detail, the risks and benefits of surgery for her left open ankle fracture with repeat debridement and revision fixation, including the possibility of infection, nerve injury, vessel injury, wound breakdown, arthritis, symptomatic hardware, DVT/ PE, loss of motion, malunion, nonunion, and need for further surgery among others.  She acknowledged these risks and wished to proceed.  Myrene Galas, MD [facility], [facility] [phone]

## 2021-01-07 NOTE — Anesthesia Preprocedure Evaluation (Addendum)
Anesthesia Evaluation  Patient identified by MRN, date of birth, ID band Patient awake    Reviewed: Allergy & Precautions, NPO status , Patient's Chart, lab work & pertinent test results  History of Anesthesia Complications (+) PONV and history of anesthetic complications  Airway Mallampati: II  TM Distance: >3 FB Neck ROM: Full    Dental  (+) Dental Advisory Given, Teeth Intact   Pulmonary sleep apnea and Continuous Positive Airway Pressure Ventilation ,    Pulmonary exam normal        Cardiovascular hypertension, Pt. on medications Normal cardiovascular exam     Neuro/Psych negative neurological ROS  negative psych ROS   GI/Hepatic negative GI ROS, Neg liver ROS,   Endo/Other  Hypothyroidism   Renal/GU negative Renal ROS  negative genitourinary   Musculoskeletal  (+) Arthritis ,   Abdominal   Peds  Hematology negative hematology ROS (+)   Anesthesia Other Findings   Reproductive/Obstetrics negative OB ROS                           Anesthesia Physical  Anesthesia Plan  ASA: 2  Anesthesia Plan: MAC and Regional   Post-op Pain Management:  Regional for Post-op pain   Induction: Intravenous  PONV Risk Score and Plan: 3 and Treatment may vary due to age or medical condition, Ondansetron, Propofol infusion and TIVA  Airway Management Planned: Natural Airway, Nasal Cannula and Simple Face Mask  Additional Equipment: None  Intra-op Plan:   Post-operative Plan:   Informed Consent: I have reviewed the patients History and Physical, chart, labs and discussed the procedure including the risks, benefits and alternatives for the proposed anesthesia with the patient or authorized representative who has indicated his/her understanding and acceptance.     Dental advisory given  Plan Discussed with:   Anesthesia Plan Comments:         Anesthesia Quick Evaluation
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
## 2021-01-07 NOTE — Transfer of Care (Signed)
Immediate Anesthesia Transfer of Care Note  Patient: Hannah Tucker  Procedure(s) Performed: IRRIGATION AND DEBRIDEMENT EXTREMITY, Left ankle (Left) SCREW REMOVAL ANKLE (Left: Ankle)  Patient Location: PACU  Anesthesia Type:MAC combined with regional for post-op pain  Level of Consciousness: awake, alert  and oriented  Airway & Oxygen Therapy: Patient Spontanous Breathing and Patient connected to face mask oxygen  Post-op Assessment: Report given to RN, Post -op Vital signs reviewed and stable and Patient moving all extremities  Post vital signs: stable  Last Vitals:  Vitals Value Taken Time  BP 150/72   Temp 99.0   Pulse 72   Resp 15   SpO2 95     Last Pain:  Vitals:   01/07/21 1351  TempSrc: Oral  PainSc:          Complications: No notable events documented.

## 2021-01-07 NOTE — Progress Notes (Signed)
PT Cancellation Note  Patient Details Name: Hannah Tucker MRN: [id number] DOB: 07/24/1940   Cancelled Treatment:    Reason Eval/Treat Not Completed: Patient at procedure or test/unavailable - repeat ORIF, will check back.  Marye Round, PT DPT Acute Rehabilitation Services Pager [phone]  Office [phone]    Truddie Coco 01/07/2021, 2:33 PM

## 2021-01-07 NOTE — Progress Notes (Signed)
Patient refused the 0230 labs. RN went into room to see if lab can draw morning labs patient refused due to being in pain and wanted them to come back later on. RN gave prn pain medication and rescheduled morning labs for 0530.

## 2021-01-07 NOTE — Progress Notes (Signed)
Orthopedic Tech Progress Note Patient Details:  Hannah Tucker April 23, 1940 [id number]  Ortho Devices Type of Ortho Device: Bone foam zero knee Ortho Device/Splint Location: RLE Ortho Device/Splint Interventions: Ordered   Post Interventions Patient Tolerated: Well Instructions Provided: Care of device, Poper ambulation with device Dropped off in pt's room, they are about to go to surgery.  [name] 01/07/2021, 1:19 PM

## 2021-01-08 ENCOUNTER — Other Ambulatory Visit: Payer: Self-pay

## 2021-01-08 ENCOUNTER — Encounter (HOSPITAL_COMMUNITY): Payer: Self-pay | Admitting: Orthopedic Surgery

## 2021-01-08 ENCOUNTER — Inpatient Hospital Stay (HOSPITAL_COMMUNITY)
Admission: RE | Admit: 2021-01-08 | Discharge: 2021-01-20 | DRG: 560 | Disposition: A | Discharge status: Home / Self Care | Payer: Medicare Other | Source: Intra-hospital | Attending: Physical Medicine and Rehabilitation | Admitting: Physical Medicine and Rehabilitation

## 2021-01-08 DIAGNOSIS — Z6833 Body mass index (BMI) 33.0-33.9, adult: Secondary | ICD-10-CM

## 2021-01-08 DIAGNOSIS — Z881 Allergy status to other antibiotic agents status: Secondary | ICD-10-CM | POA: Diagnosis not present

## 2021-01-08 DIAGNOSIS — Z79891 Long term (current) use of opiate analgesic: Secondary | ICD-10-CM | POA: Diagnosis not present

## 2021-01-08 DIAGNOSIS — Z888 Allergy status to other drugs, medicaments and biological substances status: Secondary | ICD-10-CM

## 2021-01-08 DIAGNOSIS — I10 Essential (primary) hypertension: Secondary | ICD-10-CM | POA: Diagnosis present

## 2021-01-08 DIAGNOSIS — E559 Vitamin D deficiency, unspecified: Secondary | ICD-10-CM | POA: Diagnosis present

## 2021-01-08 DIAGNOSIS — R52 Pain, unspecified: Secondary | ICD-10-CM

## 2021-01-08 DIAGNOSIS — G4733 Obstructive sleep apnea (adult) (pediatric): Secondary | ICD-10-CM | POA: Diagnosis present

## 2021-01-08 DIAGNOSIS — R0902 Hypoxemia: Secondary | ICD-10-CM | POA: Diagnosis present

## 2021-01-08 DIAGNOSIS — S82892B Other fracture of left lower leg, initial encounter for open fracture type I or II: Secondary | ICD-10-CM | POA: Diagnosis not present

## 2021-01-08 DIAGNOSIS — M1990 Unspecified osteoarthritis, unspecified site: Secondary | ICD-10-CM | POA: Diagnosis present

## 2021-01-08 DIAGNOSIS — S82872E Displaced pilon fracture of left tibia, subsequent encounter for open fracture type I or II with routine healing: Principal | ICD-10-CM

## 2021-01-08 DIAGNOSIS — Z713 Dietary counseling and surveillance: Secondary | ICD-10-CM

## 2021-01-08 DIAGNOSIS — E876 Hypokalemia: Secondary | ICD-10-CM

## 2021-01-08 DIAGNOSIS — Z79890 Hormone replacement therapy (postmenopausal): Secondary | ICD-10-CM

## 2021-01-08 DIAGNOSIS — D62 Acute posthemorrhagic anemia: Secondary | ICD-10-CM | POA: Diagnosis present

## 2021-01-08 DIAGNOSIS — G4730 Sleep apnea, unspecified: Secondary | ICD-10-CM | POA: Diagnosis present

## 2021-01-08 DIAGNOSIS — G8918 Other acute postprocedural pain: Secondary | ICD-10-CM | POA: Diagnosis present

## 2021-01-08 DIAGNOSIS — K5900 Constipation, unspecified: Secondary | ICD-10-CM

## 2021-01-08 DIAGNOSIS — Z96651 Presence of right artificial knee joint: Secondary | ICD-10-CM | POA: Diagnosis present

## 2021-01-08 DIAGNOSIS — H538 Other visual disturbances: Secondary | ICD-10-CM | POA: Diagnosis present

## 2021-01-08 DIAGNOSIS — T1490XA Injury, unspecified, initial encounter: Secondary | ICD-10-CM | POA: Diagnosis present

## 2021-01-08 DIAGNOSIS — E669 Obesity, unspecified: Secondary | ICD-10-CM | POA: Diagnosis present

## 2021-01-08 DIAGNOSIS — Z79899 Other long term (current) drug therapy: Secondary | ICD-10-CM | POA: Diagnosis not present

## 2021-01-08 DIAGNOSIS — M898X9 Other specified disorders of bone, unspecified site: Secondary | ICD-10-CM | POA: Diagnosis present

## 2021-01-08 DIAGNOSIS — Z96659 Presence of unspecified artificial knee joint: Secondary | ICD-10-CM

## 2021-01-08 DIAGNOSIS — Z96653 Presence of artificial knee joint, bilateral: Secondary | ICD-10-CM | POA: Diagnosis present

## 2021-01-08 DIAGNOSIS — S82892S Other fracture of left lower leg, sequela: Secondary | ICD-10-CM

## 2021-01-08 DIAGNOSIS — E039 Hypothyroidism, unspecified: Secondary | ICD-10-CM | POA: Diagnosis present

## 2021-01-08 DIAGNOSIS — K5901 Slow transit constipation: Secondary | ICD-10-CM | POA: Diagnosis not present

## 2021-01-08 DIAGNOSIS — G479 Sleep disorder, unspecified: Secondary | ICD-10-CM

## 2021-01-08 MED ORDER — OXYCODONE HCL 5 MG PO TABS
5.0000 mg | ORAL_TABLET | ORAL | Status: DC | PRN
  Administered 2021-01-08 – 2021-01-09 (×2): 10 mg via ORAL
  Filled 2021-01-08 (×2): qty 2

## 2021-01-08 MED ORDER — SENNOSIDES-DOCUSATE SODIUM 8.6-50 MG PO TABS: 1.0000 | ORAL_TABLET | Freq: Every evening | ORAL | Status: DC | PRN

## 2021-01-08 MED ORDER — AMLODIPINE BESYLATE 5 MG PO TABS
5.0000 mg | ORAL_TABLET | Freq: Every day | ORAL | Status: DC
  Administered 2021-01-08 – 2021-01-19 (×12): 5 mg via ORAL
  Filled 2021-01-08 (×12): qty 1

## 2021-01-08 MED ORDER — ACETAMINOPHEN 500 MG PO TABS
500.0000 mg | ORAL_TABLET | Freq: Four times a day (QID) | ORAL | Status: DC
  Administered 2021-01-08 – 2021-01-20 (×44): 500 mg via ORAL
  Filled 2021-01-08 (×44): qty 1

## 2021-01-08 MED ORDER — TRAMADOL HCL 50 MG PO TABS
50.0000 mg | ORAL_TABLET | Freq: Four times a day (QID) | ORAL | Status: DC | PRN
  Administered 2021-01-11 – 2021-01-20 (×4): 50 mg via ORAL
  Filled 2021-01-08 (×4): qty 1

## 2021-01-08 MED ORDER — ENSURE ENLIVE PO LIQD: 237.0000 mL | Freq: Two times a day (BID) | ORAL | 12 refills | Status: AC

## 2021-01-08 MED ORDER — TRAZODONE HCL 50 MG PO TABS
25.0000 mg | ORAL_TABLET | Freq: Every evening | ORAL | Status: DC | PRN
  Administered 2021-01-08 – 2021-01-13 (×5): 50 mg via ORAL
  Filled 2021-01-08 (×6): qty 1

## 2021-01-08 MED ORDER — GUAIFENESIN-DM 100-10 MG/5ML PO SYRP: 5.0000 mL | ORAL_SOLUTION | Freq: Four times a day (QID) | ORAL | Status: DC | PRN

## 2021-01-08 MED ORDER — POLYETHYLENE GLYCOL 3350 17 G PO PACK
17.0000 g | PACK | Freq: Every day | ORAL | Status: DC
  Administered 2021-01-08: 17 g via ORAL
  Filled 2021-01-08 (×3): qty 1

## 2021-01-08 MED ORDER — DOCUSATE SODIUM 100 MG PO CAPS
100.0000 mg | ORAL_CAPSULE | Freq: Two times a day (BID) | ORAL | Status: DC
  Administered 2021-01-08 – 2021-01-20 (×24): 100 mg via ORAL
  Filled 2021-01-08 (×25): qty 1

## 2021-01-08 MED ORDER — PROCHLORPERAZINE EDISYLATE 10 MG/2ML IJ SOLN: 5.0000 mg | Freq: Four times a day (QID) | INTRAMUSCULAR | Status: DC | PRN

## 2021-01-08 MED ORDER — SORBITOL 70 % SOLN
45.0000 mL | Freq: Once | Status: AC
  Administered 2021-01-08: 45 mL via ORAL
  Filled 2021-01-08: qty 60

## 2021-01-08 MED ORDER — LEVOTHYROXINE SODIUM 88 MCG PO TABS
88.0000 ug | ORAL_TABLET | Freq: Every day | ORAL | Status: DC
  Administered 2021-01-09 – 2021-01-20 (×12): 88 ug via ORAL
  Filled 2021-01-08 (×12): qty 1

## 2021-01-08 MED ORDER — ENOXAPARIN SODIUM 40 MG/0.4ML IJ SOSY: 40.0000 mg | PREFILLED_SYRINGE | INTRAMUSCULAR | Status: DC

## 2021-01-08 MED ORDER — ENSURE ENLIVE PO LIQD
237.0000 mL | Freq: Two times a day (BID) | ORAL | Status: DC
  Administered 2021-01-09 – 2021-01-16 (×12): 237 mL via ORAL

## 2021-01-08 MED ORDER — FLEET ENEMA 7-19 GM/118ML RE ENEM
1.0000 | ENEMA | Freq: Once | RECTAL | Status: DC | PRN
  Filled 2021-01-08: qty 1

## 2021-01-08 MED ORDER — LOSARTAN POTASSIUM 50 MG PO TABS
100.0000 mg | ORAL_TABLET | Freq: Every day | ORAL | Status: DC
  Administered 2021-01-09 – 2021-01-20 (×12): 100 mg via ORAL
  Filled 2021-01-08 (×12): qty 2

## 2021-01-08 MED ORDER — VITAMIN D 25 MCG (1000 UNIT) PO TABS
2000.0000 [IU] | ORAL_TABLET | Freq: Two times a day (BID) | ORAL | Status: DC
  Administered 2021-01-08 – 2021-01-20 (×24): 2000 [IU] via ORAL
  Filled 2021-01-08 (×26): qty 2

## 2021-01-08 MED ORDER — OXYCODONE HCL 5 MG PO TABS: 5.0000 mg | ORAL_TABLET | ORAL | Status: DC | PRN

## 2021-01-08 MED ORDER — HYDROCHLOROTHIAZIDE 12.5 MG PO TABS
12.5000 mg | ORAL_TABLET | Freq: Every day | ORAL | Status: DC
  Administered 2021-01-09 – 2021-01-20 (×12): 12.5 mg via ORAL
  Filled 2021-01-08 (×12): qty 1

## 2021-01-08 MED ORDER — GATIFLOXACIN 0.5 % OP SOLN
1.0000 [drp] | Freq: Four times a day (QID) | OPHTHALMIC | Status: DC
  Administered 2021-01-08 – 2021-01-20 (×49): 1 [drp] via OPHTHALMIC
  Filled 2021-01-08: qty 2.5

## 2021-01-08 MED ORDER — VITAMIN D3 25 MCG PO TABS: 2000.0000 [IU] | ORAL_TABLET | Freq: Two times a day (BID) | ORAL | Status: DC

## 2021-01-08 MED ORDER — METHOCARBAMOL 500 MG PO TABS: 500.0000 mg | ORAL_TABLET | Freq: Three times a day (TID) | ORAL | Status: DC | PRN

## 2021-01-08 MED ORDER — POLYETHYLENE GLYCOL 3350 17 G PO PACK: 17.0000 g | PACK | Freq: Every day | ORAL | Status: DC | PRN

## 2021-01-08 MED ORDER — ASCORBIC ACID 500 MG PO TABS
500.0000 mg | ORAL_TABLET | Freq: Every day | ORAL | Status: DC
  Administered 2021-01-09 – 2021-01-20 (×12): 500 mg via ORAL
  Filled 2021-01-08 (×12): qty 1

## 2021-01-08 MED ORDER — ALUM & MAG HYDROXIDE-SIMETH 200-200-20 MG/5ML PO SUSP: 30.0000 mL | ORAL | Status: DC | PRN

## 2021-01-08 MED ORDER — PROCHLORPERAZINE MALEATE 5 MG PO TABS: 5.0000 mg | ORAL_TABLET | Freq: Four times a day (QID) | ORAL | Status: DC | PRN

## 2021-01-08 MED ORDER — ACETAMINOPHEN 325 MG PO TABS
325.0000 mg | ORAL_TABLET | ORAL | Status: DC | PRN
  Administered 2021-01-09: 325 mg via ORAL
  Filled 2021-01-08: qty 2

## 2021-01-08 MED ORDER — BISACODYL 10 MG RE SUPP
10.0000 mg | Freq: Every day | RECTAL | Status: DC | PRN
  Administered 2021-01-08: 10 mg via RECTAL
  Filled 2021-01-08: qty 1

## 2021-01-08 MED ORDER — KETOROLAC TROMETHAMINE 0.5 % OP SOLN: 1.0000 [drp] | Freq: Three times a day (TID) | OPHTHALMIC | Status: DC | PRN

## 2021-01-08 MED ORDER — METHOCARBAMOL 500 MG PO TABS
500.0000 mg | ORAL_TABLET | Freq: Four times a day (QID) | ORAL | Status: DC | PRN
  Administered 2021-01-13 – 2021-01-14 (×2): 500 mg via ORAL
  Filled 2021-01-08 (×2): qty 1

## 2021-01-08 MED ORDER — ENOXAPARIN SODIUM 40 MG/0.4ML IJ SOSY
40.0000 mg | PREFILLED_SYRINGE | INTRAMUSCULAR | Status: DC
  Administered 2021-01-09 – 2021-01-16 (×8): 40 mg via SUBCUTANEOUS
  Filled 2021-01-08 (×8): qty 0.4

## 2021-01-08 MED ORDER — PROCHLORPERAZINE 25 MG RE SUPP: 12.5000 mg | Freq: Four times a day (QID) | RECTAL | Status: DC | PRN

## 2021-01-08 NOTE — Progress Notes (Signed)
Inpatient Rehabilitation Admission Medication Review by a Pharmacist  A complete drug regimen review was completed for this patient to identify any potential clinically significant medication issues.  High Risk Drug Classes Is patient taking? Indication by Medication  Antipsychotic Yes Compazine for N/V  Anticoagulant Yes Lovenox for VTE ppx  Antibiotic No   Opioid Yes Ultram for pain  Antiplatelet No   Hypoglycemics/insulin No   Vasoactive Medication Yes Losartan, HCTZ, amlodipine for BP   Chemotherapy No   Other No      Type of Medication Issue Identified Description of Issue Recommendation(s)  Drug Interaction(s) (clinically significant)     Duplicate Therapy     Allergy     No Medication Administration End Date     Incorrect Dose     Additional Drug Therapy Needed     Significant med changes from prior encounter (inform family/care partners about these prior to discharge).    Other       Clinically significant medication issues were identified that warrant physician communication and completion of prescribed/recommended actions by midnight of the next day:  No  Pharmacist comments: Stop date for Zymaxid eye drops  Time spent performing this drug regimen review (minutes):  20 minutes   Elwin Sleight 01/08/2021 2:16 PM

## 2021-01-08 NOTE — Progress Notes (Signed)
Alert and oriented x4, accompanied by staff. Patient oriented to unit and to assigned room. Patient denies pain, no signs or symptoms of distress noted. Lungs noted cta lobes. Cast to LLE. Aquacel dressing to RLE. Foam dressing to sacrum/buttocks; fissure noted to coccyx area. Brusiing noted to left arm. All assessments completed. Bed in lowest position, call bell within reach.
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
## 2021-01-08 NOTE — Progress Notes (Signed)
Inpatient Rehab Admissions Coordinator:  ° °I have a CIR bed for this Pt. Today. RN may call report to [phone]. ° °Tyrez Berrios, MS, CCC-SLP °Rehab Admissions Coordinator  °[phone] (celll) °[phone] (office) ° °

## 2021-01-08 NOTE — Discharge Summary (Signed)
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 18:05   DG C-Arm 1-60 Min-No Report  Result Date: 01/04/2021 Fluoroscopy was utilized by the requesting physician.  No radiographic interpretation.   DG C-Arm 1-60 Min-No Report  Result Date: 01/04/2021 Fluoroscopy was utilized by the requesting physician.  No radiographic interpretation.   DG C-Arm 1-60 Min-No Report  Result Date: 01/04/2021 Fluoroscopy was utilized by the requesting physician.  No radiographic interpretation.     Labs:   Basic Metabolic Panel: Recent Labs  Lab 01/03/21 1648 01/04/21 0437 01/04/21 1457 01/05/21 0417 01/06/21 0612 01/07/21 0454  NA 137 136  --  139 137 139  K 3.8 3.5  --  3.4* 3.6 3.7  CL 102 102  --  107 105 103  CO2 26 28  --  27 27 32  GLUCOSE 136* 246*  --  107* 96 103*  BUN 13 7*  --  7* 12 10  CREATININE 0.72 0.68 0.63 0.73 0.76 0.73  CALCIUM 8.6* 8.1*  --  8.1* 8.1* 8.3*    GFR Estimated Creatinine Clearance: 55.2 mL/min (by C-G formula based on SCr of 0.73 mg/dL). Liver Function Tests: Recent Labs  Lab 01/03/21 1648  AST 48*  ALT 26  ALKPHOS 36*  BILITOT 0.9  PROT 6.0*  ALBUMIN 2.9*   No results for input(s): LIPASE, AMYLASE in the last 168 hours. No results for input(s): AMMONIA in the last 168 hours. Coagulation profile Recent Labs  Lab 01/03/21 1648  INR 1.1    CBC: Recent Labs  Lab 01/03/21 1648 01/04/21 0437 01/04/21 1457 01/05/21 0417 01/06/21 0612 01/07/21 0454  WBC 11.2* 7.2 9.2 7.6 7.9 6.8  NEUTROABS 9.3*  --   --  5.8 4.9 4.6  HGB 11.3* 10.2* 10.9* 9.3* 8.8* 9.1*  HCT 35.2* 30.8* 33.5* 27.2* 28.0* 28.0*  MCV 84.2 82.6 82.5 81.9 84.8 83.1  PLT 269 243 261 230 257 259   Cardiac Enzymes: No results for input(s): CKTOTAL, CKMB, CKMBINDEX, TROPONINI in the last 168 hours. BNP: Invalid input(s): POCBNP CBG: No results for input(s): GLUCAP in the last 168 hours. D-Dimer No results for input(s): DDIMER in the last 72 hours. Hgb A1c No results for input(s): HGBA1C in the last 72 hours. Lipid Profile No results for input(s): CHOL, HDL, LDLCALC, TRIG, CHOLHDL, LDLDIRECT in the last 72 hours. Thyroid function studies No results for input(s): TSH, T4TOTAL, T3FREE, THYROIDAB in the last 72 hours.  Invalid input(s): FREET3 Anemia work up No results for input(s): VITAMINB12, FOLATE, FERRITIN, TIBC, IRON, RETICCTPCT in the last 72 hours. Microbiology Recent Results (from the past 240 hour(s))  Resp Panel by RT-PCR (Flu A&B, Covid) Nasopharyngeal Swab     Status: None   Collection Time: 01/03/21  4:48 PM   Specimen: Nasopharyngeal Swab; Nasopharyngeal(NP) swabs in vial transport medium  Result Value Ref Range Status   SARS Coronavirus 2 by RT PCR NEGATIVE NEGATIVE Final    Comment: (NOTE) SARS-CoV-2 target nucleic acids are NOT DETECTED.  The SARS-CoV-2 RNA is generally detectable in upper respiratory specimens during the  acute phase of infection. The lowest concentration of SARS-CoV-2 viral copies this assay can detect is 138 copies/mL. A negative result does not preclude SARS-Cov-2 infection and should not be used as the sole basis for treatment or other patient management decisions. A negative result may occur with  improper specimen collection/handling, submission of specimen other than nasopharyngeal swab, presence of viral mutation(s) within the areas targeted by this assay, and inadequate number of viral copies(<138 copies/mL). A negative result must be combined with clinical observations, patient history, and epidemiological information. The expected result is Negative.  Fact Sheet for Patients:  BloggerCourse.com  Fact Sheet for Healthcare Providers:  SeriousBroker.it  This test is no t yet approved or cleared by the [place] FDA and  has been authorized for detection and/or diagnosis of SARS-CoV-2 by FDA under an Emergency Use Authorization (EUA). This EUA will remain  in effect (meaning this test can be used) for the duration of the COVID-19 declaration under Section 564(b)(1) of the Act, 21 U.S.C.section 360bbb-3(b)(1), unless the authorization is terminated  or revoked sooner.       Influenza A by PCR NEGATIVE NEGATIVE Final   Influenza B by PCR NEGATIVE NEGATIVE Final    Comment: (NOTE) The Xpert Xpress SARS-CoV-2/FLU/RSV plus assay is intended as an aid in the diagnosis of influenza from Nasopharyngeal swab specimens and should not be used as a sole basis for treatment. Nasal washings and aspirates are unacceptable for Xpert Xpress SARS-CoV-2/FLU/RSV testing.  Fact Sheet for Patients: BloggerCourse.com  Fact Sheet for Healthcare Providers: SeriousBroker.it  This test is not yet approved or cleared by the [place] FDA and has been authorized for detection and/or  diagnosis of SARS-CoV-2 by FDA under an Emergency Use Authorization (EUA). This EUA will remain in effect (meaning this test can be used) for the duration of the COVID-19 declaration under Section 564(b)(1) of the Act, 21 U.S.C. section  360bbb-3(b)(1), unless the authorization is terminated or revoked.  Performed at [facility], [address], [place], [place] [address]   Surgical pcr screen     Status: None   Collection Time: 01/04/21  4:15 AM   Specimen: Nasal Mucosa; Nasal Swab  Result Value Ref Range Status   MRSA, PCR NEGATIVE NEGATIVE Final   Staphylococcus aureus NEGATIVE NEGATIVE Final    Comment: (NOTE) The Xpert SA Assay (FDA approved for NASAL specimens in patients 21 years of age and older), is one component of a comprehensive surveillance program. It is not intended to diagnose infection nor to guide or monitor treatment. Performed at [facility], [address] [place], [place] [address]      Signed: Lorin Glass  [facility] 01/08/2021, 11:38 AM
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
## 2021-01-08 NOTE — Progress Notes (Signed)
RT came earlier and talked to patient about wearing CPAP. Pt stated she will call when she is ready for it.

## 2021-01-09 ENCOUNTER — Encounter (HOSPITAL_COMMUNITY): Payer: Self-pay | Admitting: Orthopedic Surgery

## 2021-01-09 DIAGNOSIS — S82892B Other fracture of left lower leg, initial encounter for open fracture type I or II: Secondary | ICD-10-CM | POA: Diagnosis not present

## 2021-01-09 LAB — COMPREHENSIVE METABOLIC PANEL
ALT: 18 U/L (ref 0–44)
AST: 21 U/L (ref 15–41)
Albumin: 2.4 g/dL — ABNORMAL LOW (ref 3.5–5.0)
Alkaline Phosphatase: 29 U/L — ABNORMAL LOW (ref 38–126)
Anion gap: 9 (ref 5–15)
BUN: 17 mg/dL (ref 8–23)
CO2: 30 mmol/L (ref 22–32)
Calcium: 8.7 mg/dL — ABNORMAL LOW (ref 8.9–10.3)
Chloride: 99 mmol/L (ref 98–111)
Creatinine, Ser: 0.81 mg/dL (ref 0.44–1.00)
GFR, Estimated: >60 mL/min (ref >60)
Glucose, Bld: 100 mg/dL — ABNORMAL HIGH (ref 70–99)
Potassium: 3.7 mmol/L (ref 3.5–5.1)
Sodium: 138 mmol/L (ref 135–145)
Total Bilirubin: 0.5 mg/dL (ref 0.3–1.2)
Total Protein: 5.6 g/dL — ABNORMAL LOW (ref 6.5–8.1)

## 2021-01-09 LAB — CBC WITH DIFFERENTIAL/PLATELET
Abs Immature Granulocytes: 0.07 10*3/uL (ref 0.00–0.07)
Basophils Absolute: 0 10*3/uL (ref 0.0–0.1)
Basophils Relative: 0 %
Eosinophils Absolute: 0.2 10*3/uL (ref 0.0–0.5)
Eosinophils Relative: 3 %
HCT: 30.4 % — ABNORMAL LOW (ref 36.0–46.0)
Hemoglobin: 9.7 g/dL — ABNORMAL LOW (ref 12.0–15.0)
Immature Granulocytes: 1 %
Lymphocytes Relative: 14 %
Lymphs Abs: 1.4 10*3/uL (ref 0.7–4.0)
MCH: 27 pg (ref 26.0–34.0)
MCHC: 31.9 g/dL (ref 30.0–36.0)
MCV: 84.7 fL (ref 80.0–100.0)
Monocytes Absolute: 0.7 10*3/uL (ref 0.1–1.0)
Monocytes Relative: 8 %
Neutro Abs: 7.2 10*3/uL (ref 1.7–7.7)
Neutrophils Relative %: 74 %
Platelets: 380 10*3/uL (ref 150–400)
RBC: 3.59 MIL/uL — ABNORMAL LOW (ref 3.87–5.11)
RDW: 14.7 % (ref 11.5–15.5)
WBC: 9.6 10*3/uL (ref 4.0–10.5)
nRBC: 0 % (ref 0.0–0.2)

## 2021-01-09 MED ORDER — POTASSIUM CHLORIDE 20 MEQ PO PACK
20.0000 meq | PACK | Freq: Once | ORAL | Status: AC
  Administered 2021-01-09: 20 meq via ORAL
  Filled 2021-01-09: qty 1

## 2021-01-09 MED ORDER — MAGNESIUM GLUCONATE 500 MG PO TABS
250.0000 mg | ORAL_TABLET | Freq: Every day | ORAL | Status: DC
  Administered 2021-01-09 – 2021-01-19 (×11): 250 mg via ORAL
  Filled 2021-01-09 (×11): qty 1
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
## 2021-01-09 NOTE — Anesthesia Postprocedure Evaluation (Signed)
Anesthesia Post Note  Patient: CRISTY COLMENARES  Procedure(s) Performed: IRRIGATION AND DEBRIDEMENT EXTREMITY, Left ankle (Left) SCREW REMOVAL ANKLE REMOVAL OF WOUND VAC LEFT LEG (Left: Ankle)     Patient location during evaluation: PACU Anesthesia Type: Regional and MAC Level of consciousness: awake and alert Pain management: pain level controlled Vital Signs Assessment: post-procedure vital signs reviewed and stable Respiratory status: spontaneous breathing, nonlabored ventilation, respiratory function stable and patient connected to nasal cannula oxygen Cardiovascular status: stable and blood pressure returned to baseline Postop Assessment: no apparent nausea or vomiting Anesthetic complications: no   No notable events documented.  Last Vitals:  Vitals:   01/08/21 0929 01/08/21 1209  BP: 137/61 (!) 142/59  Pulse: 83   Resp: 18   Temp: 37 C 37.1 C  SpO2:      Last Pain:  Vitals:   01/08/21 1215  TempSrc:   PainSc: 2                  Adriann Ballweg

## 2021-01-09 NOTE — Progress Notes (Signed)
Inpatient Rehabilitation Care Coordinator Assessment and Plan Patient Details  Name: Hannah Tucker MRN: [id number] Date of Birth: 08/03/1940  Today's Date: 01/09/2021  Hospital Problems: Principal Problem:   Open left ankle fracture Active Problems:   Trauma  Past Medical History:  Past Medical History:  Diagnosis Date   Arthritis    Complication of anesthesia    nausea   Dyspnea    with exertion   Family history of adverse reaction to anesthesia    PONV- MOM   Hypertension    Hypothyroidism    PONV (postoperative nausea and vomiting)    Sleep apnea    uses CPAP nightly   Stress incontinence    Past Surgical History:  Past Surgical History:  Procedure Laterality Date   APPLICATION OF WOUND VAC Right 01/04/2021   Procedure: APPLICATION OF WOUND VAC;  Surgeon: Altamese Mendes, MD;  Location: [place];  Service: Orthopedics;  Laterality: Right;   BREAST CYST ASPIRATION Bilateral    neg   BREAST EXCISIONAL BIOPSY Right    1970's   CATARACT EXTRACTION W/ INTRAOCULAR LENS  IMPLANT, BILATERAL Bilateral    COLONOSCOPY     DILATION AND CURETTAGE OF UTERUS     I & D EXTREMITY Left 01/04/2021   Procedure: IRRIGATION AND DEBRIDEMENT LEFT ANKLE;  Surgeon: Altamese [place], MD;  Location: [place];  Service: Orthopedics;  Laterality: Left;   I & D EXTREMITY Left 01/07/2021   Procedure: IRRIGATION AND DEBRIDEMENT EXTREMITY, Left ankle;  Surgeon: Altamese [place], MD;  Location: [place];  Service: Orthopedics;  Laterality: Left;   KNEE ARTHROPLASTY Left 11/24/2016   Procedure: COMPUTER ASSISTED TOTAL KNEE ARTHROPLASTY;  Surgeon: Dereck Leep, MD;  Location: [facility];  Service: Orthopedics;  Laterality: Left;   KNEE ARTHROPLASTY Right 12/31/2020   Procedure: COMPUTER ASSISTED TOTAL KNEE ARTHROPLASTY;  Surgeon: Dereck Leep, MD;  Location: [facility];  Service: Orthopedics;  Laterality: Right;   KNEE ARTHROSCOPY Left    KNEE ARTHROSCOPY Left 07/18/2015   Procedure: ARTHROSCOPY KNEE WITH MEDIAL  COMPARTMENTAL MENICUS CHONDROPLASTY;  Surgeon: Dereck Leep, MD;  Location: [facility];  Service: Orthopedics;  Laterality: Left;   KNEE ARTHROSCOPY WITH LATERAL MENISECTOMY Left 07/18/2015   Procedure: LEFT KNEE ARTHROSCOPY WITH PARTIAL LATERAL MENISECTOMY GRADE 4 CHONDROMYLASIA LATERAL TIBIAL PLATEAU;  Surgeon: Dereck Leep, MD;  Location: [facility];  Service: Orthopedics;  Laterality: Left;   ORIF ANKLE FRACTURE Left 01/04/2021   Procedure: OPEN REDUCTION INTERNAL FIXATION (ORIF) ANKLE FRACTURE;  Surgeon: Altamese [place], MD;  Location: [place];  Service: Orthopedics;  Laterality: Left;   ORIF ANKLE FRACTURE Left 01/07/2021   Procedure: SCREW REMOVAL ANKLE REMOVAL OF WOUND VAC LEFT LEG;  Surgeon: Altamese [place], MD;  Location: [place];  Service: Orthopedics;  Laterality: Left;   Social History:  reports that she has never smoked. She has never used smokeless tobacco. She reports that she does not drink alcohol and does not use drugs.  Family / Support Systems Marital Status: Married Patient Roles: Spouse Spouse/Significant Other: Dennard Nip Children: Doy Hutching (son) Anticipated Caregiver: Spouse and children Ability/Limitations of Caregiver: none Caregiver Availability: 24/7 Family Dynamics: support from spouse and children  Social History Preferred language: English Religion: [employer] - How often do you need to have someone help you when you read instructions, pamphlets, or other written material from your doctor or pharmacy?: Never Writes: Yes Legal History/Current Legal Issues: n/a Guardian/Conservator: n/a   Abuse/Neglect Abuse/Neglect Assessment Can Be Completed: Yes Physical Abuse:  Denies Verbal Abuse: Denies Sexual Abuse: Denies Exploitation of patient/patient's resources: Denies Self-Neglect: Denies  Patient response to: Social Isolation - How often do you feel lonely or isolated from those around you?: Never  Emotional Status Recent  Psychosocial Issues: n/a Psychiatric History: n/a Substance Abuse History: n/a  Patient / Family Perceptions, Expectations & Goals Pt/Family understanding of illness & functional limitations: yes Premorbid pt/family roles/activities: independent Anticipated changes in roles/activities/participation: spouse able to prvide assistance Pt/family expectations/goals: Warden/ranger Agencies: None Premorbid Home Care/DME Agencies: Other (Comment) Librarian, academic, [employer], [facility]) Transportation available at discharge: family able to transport Is the patient able to respond to transportation needs?: Yes In the past 12 months, has lack of transportation kept you from medical appointments or from getting medications?: No In the past 12 months, has lack of transportation kept you from meetings, work, or from getting things needed for daily living?: No Resource referrals recommended: Neuropsychology  Discharge Planning Living Arrangements: Spouse/significant other Support Systems: Spouse/significant other Type of Residence: Private residence Insurance Resources: [employer], Multimedia programmer (specify) Nurse, mental health) Financial Resources: [employer] Financial Screen Referred: No Living Expenses: Education officer, community Management: Patient, Spouse Does the patient have any problems obtaining your medications?: No Home Management: independent Patient/Family Preliminary Plans: spouse able to assist if needed Care Coordinator Barriers to Discharge: Wound Care, Other (comments), Weight bearing restrictions Care Coordinator Barriers to Discharge Comments: Wound VAC, Care Coordinator Anticipated Follow Up Needs: HH/OP, SNF Expected length of stay: 7-10 Days  Clinical Impression  Sw met with patient introduced self, explained role and addressed questions and concerns. No additional questions, sw will continue to follow up   Dyanne Iha 01/09/2021, 2:10 PM
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
## 2021-01-09 NOTE — Progress Notes (Signed)
Inpatient Rehabilitation Center Individual Statement of Services  Patient Name:  Hannah Tucker  Date:  01/09/2021  Welcome to the Inpatient Rehabilitation Center.  Our goal is to provide you with an individualized program based on your diagnosis and situation, designed to meet your specific needs.  With this comprehensive rehabilitation program, you will be expected to participate in at least 3 hours of rehabilitation therapies Monday-Friday, with modified therapy programming on the weekends.  Your rehabilitation program will include the following services:  Physical Therapy (PT), Occupational Therapy (OT), Speech Therapy (ST), 24 hour per day rehabilitation nursing, Therapeutic Recreaction (TR), Neuropsychology, Care Coordinator, Rehabilitation Medicine, Nutrition Services, and Pharmacy Services  Weekly team conferences will be held on Wednesdays to discuss your progress.  Your Inpatient Rehabilitation Care Coordinator will talk with you frequently to get your input and to update you on team discussions.  Team conferences with you and your family in attendance may also be held.  Expected length of stay:  7-10 Days  Overall anticipated outcome:  Supervision  Depending on your progress and recovery, your program may change. Your Inpatient Rehabilitation Care Coordinator will coordinate services and will keep you informed of any changes. Your Inpatient Rehabilitation Care Coordinator's name and contact numbers are listed  below.  The following services may also be recommended but are not provided by the Inpatient Rehabilitation Center:   Home Health Rehabiltiation Services Outpatient Rehabilitation Services    Arrangements will be made to provide these services after discharge if needed.  Arrangements include referral to agencies that provide these services.  Your insurance has been verified to be:  Medicare A & B Your primary doctor is:  Bethann Punches, MD  Pertinent information will be  shared with your doctor and your insurance company.  Inpatient Rehabilitation Care Coordinator:  Lavera Guise, [place] [phone] or [phone]  Information discussed with and copy given to patient by: Andria Rhein, 01/09/2021, 11:55 AM

## 2021-01-09 NOTE — Progress Notes (Signed)
Physical Therapy Session Note  Patient Details  Name: Hannah Tucker MRN: [id number] Date of Birth: 07/02/1940  Today's Date: 01/09/2021 PT Individual Time: 6160-7371 PT Individual Time Calculation (min): 40 min   Short Term Goals: Week 1:  PT Short Term Goal 1 (Week 1): STG = LTG due to ELOS  Skilled Therapeutic Interventions/Progress Updates:   Received pt asleep in recliner, pt easily woken and agreeable to PT treatment, and did not state pain level but reports "weakness" in LLE>RLE. RN present to administer medication during session. Session with emphasis on functional mobility/transfers, generalized strengthening, dynamic standing balance/coordination, and improved endurance with activity. Pt transferred sit<>stand with RW and mod A x 3 trials with LUE on recliner and RUE on RW. Pt performed the following exercises standing with BUE support on RW and CGA for balance while adhering to LLE NWB precautions: -L hip abduction 2x10 -L hip flexion 2x15 -RLE heel raises 2x8 Pt then performed the following exercises semi-reclined in recliner due to fatigue from repeated stands: -hip adduction pillow squeezes 10x5 second isometric hold -hip abduction 2x12 bilaterally -SLR 2x10 bilaterally  CSW present to provide information. Concluded session with pt reclined in recliner with BLE's elevated, needs within reach, and chair pad alarm on  Therapy Documentation Precautions:  Restrictions Weight Bearing Restrictions: Yes RLE Weight Bearing: Weight bearing as tolerated LLE Weight Bearing: Non weight bearing  Therapy/Group: Individual Therapy Martin Majestic PT, DPT   01/09/2021, 7:47 AM

## 2021-01-09 NOTE — Progress Notes (Signed)
Inpatient Rehabilitation  Patient information reviewed and entered into eRehab system by Charniece Venturino M. Zane Pellecchia, M.A., CCC/SLP, PPS Coordinator.  Information including medical coding, functional ability and quality indicators will be reviewed and updated through discharge.    

## 2021-01-09 NOTE — Progress Notes (Signed)
Patient has home CPAP.  RT assembled and checked for frayed wire; sterile water used for water chamber.  Home CPAP was placed at bedside and patient was able to apply without any assistance.

## 2021-01-09 NOTE — Patient Care Conference (Addendum)
Inpatient RehabilitationTeam Conference and Plan of Care Update Date: 01/09/2021   Time: 11:44 AM    Patient Name: Hannah Tucker      Medical Record Number: [id number]  Date of Birth: [birth year] Sex: Female         Room/Bed: 5C20C/5C20C-01 Payor Info: Payor: MEDICARE / Plan: MEDICARE PART A AND B / Product Type: *No Product type* /    Admit Date/Time:  01/08/2021  2:09 PM  Primary Diagnosis:  Open left ankle fracture  Hospital Problems: Principal Problem:   Open left ankle fracture Active Problems:   Trauma    Expected Discharge Date: Expected Discharge Date: 01/20/21  Team Members Present: Physician leading conference: Dr. Sula Soda Social Worker Present: Lavera Guise, BSW Nurse Present: Kennyth Arnold, RN PT Present: Raechel Chute, PT OT Present: Other (comment) [facility] Cheyenne Adas, OT) PPS Coordinator present : Fae Pippin, SLP     Current Status/Progress Goal Weekly Team Focus  Bowel/Bladder   incontinent b/b, constipated, lbm 11/8  regain continence, resolve constipation  timed toileting schedule   Swallow/Nutrition/ Hydration             ADL's   Mod A bathing, supervision UBD, Max LBD, Max toileting, Mod A sit> stand and stand pivot transfer using RW  Supervision-min A  ADL retraining, AE education, pain management, functional transfers   Mobility   bed mobility min A, transfers with RW mod A - limited due to transfers only  supervision  functional mobility/transfers, generalized strengthening, dynamic standing balance/coordination, D/C planning, and endurance.   Communication             Safety/Cognition/ Behavioral Observations            Pain   3/10, left ankle uncomfortable  <3  assess pain q 4hr and prn   Skin   incisions to left and right leg  no new breakdown  assess skin q shift and prn     Discharge Planning:    Discharging home with spouse. Children will be available to assist.  Team Discussion: Very constipated with impaction. Bowel  medications given. Will receive Magnesium at night. Reports that left ankle uncomfortable. Incontinent B/B. Cast to left leg, Aquacel dressing to right leg. Min assist bed mobility, mod assist RW. Mod assist STS/SP transfers to [facility]. Supervision/min assist goals. Unsure of equipment at this time. Will determine HH/PT needs at later date. Recommend HH/OT. Recommending family to install ramp.  Patient on target to meet rehab goals: yes  *See Care Plan and progress notes for long and short-term goals.   Revisions to Treatment Plan:  Adjusting medications  Teaching Needs: Family education, medication/pain management, bowel/bladder education, skin/wound care, weight bearing precautions, safety awareness, transfer training, etc.  Current Barriers to Discharge: Decreased caregiver support, Home enviroment access/layout, Incontinence, Wound care, Lack of/limited family support, Weight, Weight bearing restrictions, and Medication compliance  Possible Resolutions to Barriers: Family education Recommending installation of ramp Follow-up HH/PT/OT Order recommended DME      Medical Summary Current Status: constipation with impaction last night, insomnia as a result of constipation treatment, left ankle uncomfortable, hypokalemia, low protein levels  Barriers to Discharge: Medical stability  Barriers to Discharge Comments: constipation with impaction last night, insomnia as a result of constipation treatment, left ankle uncomfortable, hypokalemia, low protein levels Possible Resolutions to [employer] Focus: add magnesium gluconate 250mg  HS to help prevent further constipation and help with insomnia, supplement potassium, provided dietary education   Continued Need for Acute Rehabilitation Level of Care: The  patient requires daily medical management by a physician with specialized training in physical medicine and rehabilitation for the following reasons: Direction of a multidisciplinary physical  rehabilitation program to maximize functional independence : Yes Medical management of patient stability for increased activity during participation in an intensive rehabilitation regime.: Yes Analysis of laboratory values and/or radiology reports with any subsequent need for medication adjustment and/or medical intervention. : Yes   I attest that I was present, lead the team conference, and concur with the assessment and plan of the team.   Tennis Must 01/09/2021, 12:05 PM
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
## 2021-01-09 NOTE — Progress Notes (Signed)
Patient ID: Hannah Tucker, female   DOB: 06/28/1940, 80 y.o.   MRN: [id number] Team Conference Report to Patient/Family  Team Conference discussion was reviewed with the patient and caregiver, including goals, any changes in plan of care and target discharge date.  Patient and caregiver express understanding and are in agreement.  The patient has a target discharge date of 01/20/21.  Sw met with patient introduced self, explained role and addressed questions and concerns. No additional questions, sw will continue to follow up   Dyanne Iha 01/09/2021, 2:10 PM

## 2021-01-09 NOTE — Progress Notes (Signed)
Pt requested that home CPAP machine be set up at bedside. RT notified and responded. Patient is currently wearing CPAP, all needs were met, guard rail up, call light within reach.

## 2021-01-09 NOTE — Progress Notes (Signed)
Occupational Therapy Session Note  Patient Details  Name: Hannah Tucker MRN: [id number] Date of Birth: 1940/11/08  Today's Date: 01/09/2021 OT Individual Time: 9628-3662 OT Individual Time Calculation (min): 49 min    Short Term Goals: Week 1:  OT Short Term Goal 1 (Week 1): STG = LTG due to ELOS  Skilled Therapeutic Interventions/Progress Updates:  Skilled OT intervention completed with focus on [name] transfers and toileting tasks. Pt received seated in recliner, agreeable to session. Pt reported that she used regular toilet with PT and had a difficult time completing hygiene/clothing management as well as transferring. Pt educated on efficiency of bringing [name] in room if in bed/chair, as well as ease of sit > stand from elevated surface. Discussed timed toileting to assist in improvement with incontinence. Pt opted to use [name]. Completed sit > stand from recliner with mod A using RW, stand pivot to [name] with mod A. Voided only. Pt attempted to complete front pericare with lean and reach technique however unable to access. Encouraged to stand for pericare. Sit > stand from [facility] with mod A, cues for WB precautions and standing tall vs forwardly flexed. Pt required max A for toileting, with pt attempting to wipe but unable, but able to assist with pulling brief/pants over hips. Discussed potential need for bariatric [name] to enforce wider lateral lean for pericare to adhere to WB precautions while home. Pt completed stand pivot transfer to [name] with mod A, then pt RLE "giving out" with total A needed to assist pt to seated position rest of the way. Pt with increased fatigue, requiring mod A for bed mobility to get BLEs into bed. Pt left in a position of comfort, with bed alarm and all needs in reach at end of session.   Therapy Documentation Precautions:  Precautions Precautions: Fall Precaution Comments: per ortho: "WBAT R leg for transfers only. Do not want to over work her R leg give recent surgery and  NWB on L leg" Required Braces or Orthoses: Splint/Cast Splint/Cast: Cast on LLE Restrictions Weight Bearing Restrictions: Yes RLE Weight Bearing: Weight bearing as tolerated (transfers only) LLE Weight Bearing: Non weight bearing  Pain: No c/o pain   Therapy/Group: Individual Therapy  Jacub Waiters E [name] 01/09/2021, 7:33 AM

## 2021-01-10 DIAGNOSIS — S82892B Other fracture of left lower leg, initial encounter for open fracture type I or II: Secondary | ICD-10-CM | POA: Diagnosis not present

## 2021-01-10 MED ORDER — CALCIUM POLYCARBOPHIL 625 MG PO TABS
625.0000 mg | ORAL_TABLET | Freq: Every day | ORAL | Status: DC
  Administered 2021-01-10 – 2021-01-20 (×11): 625 mg via ORAL
  Filled 2021-01-10 (×11): qty 1

## 2021-01-10 MED ORDER — OXYCODONE HCL 5 MG PO TABS
5.0000 mg | ORAL_TABLET | ORAL | Status: DC | PRN
  Administered 2021-01-10 – 2021-01-15 (×15): 5 mg via ORAL
  Filled 2021-01-10 (×15): qty 1
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
## 2021-01-10 NOTE — Progress Notes (Signed)
Occupational Therapy Session Note  Patient Details  Name: Hannah Tucker MRN: [id number] Date of Birth: 03-Jul-1940  Today's Date: 01/10/2021 OT Individual Time: 0800-0900 OT Individual Time Calculation (min): 60 min    Short Term Goals: Week 1:  OT Short Term Goal 1 (Week 1): STG = LTG due to ELOS  Skilled Therapeutic Interventions/Progress Updates:  Pt greeted supine  in bed  agreeable to OT intervention. Session focus on BADL reeducation. Pt completed bed mobility with CGA. Pt completed stand pivot transfer from [name]>[name] with rw and MOD A. Pt able to stand for toileting hygiene with CGA using R hand for pericare, MIN A for clothing mgmt. Pt completed stand pivot transfer from [name]>[name] with rw and MOD A. Pt completed bathing from [name] with overall MOD A needing assist to wash buttock in standing. Pt prefers to bring BLEs up on bed to wash LB with LH sponge provided. S/u assist for UB dressing and  MOD A for LB Dressing needing assist to thread BLEs into pants/brief, pt would benefit from reacher next ADL session. Pt completed sit>supine with CGA and increased time and effort to lift BLEs. pt left supine in bed with all needs within reach and bed alarm activated.               Therapy Documentation Precautions:  Precautions Precautions: Fall Precaution Comments: per ortho: "WBAT R leg for transfers only. Do not want to over work her R leg give recent surgery and NWB on L leg" Required Braces or Orthoses: Splint/Cast Splint/Cast: Cast on LLE Restrictions Weight Bearing Restrictions: Yes RLE Weight Bearing: Weight bearing as tolerated LLE Weight Bearing: Non weight bearing Other Position/Activity Restrictions: RLE WBAT for transfers only   Pain: pt reports no pain during session     Therapy/Group: Individual Therapy  Barron Schmid 01/10/2021, 12:11 PM

## 2021-01-10 NOTE — Progress Notes (Signed)
Pt placed on home CPAP for bed. RT will cont to monitor. 
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
## 2021-01-11 DIAGNOSIS — S82892B Other fracture of left lower leg, initial encounter for open fracture type I or II: Secondary | ICD-10-CM | POA: Diagnosis not present
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
## 2021-01-11 NOTE — Progress Notes (Signed)
Occupational Therapy Session Note  Patient Details  Name: ESTRELLA ALCARAZ MRN: [id number] Date of Birth: 05-28-1940   Today's Date: 01/11/2021 OT Individual Time: 8182-9937 OT Individual Time Calculation (min): 54 min   Short Term Goals: Week 1:  OT Short Term Goal 1 (Week 1): STG = LTG due to ELOS  Skilled Therapeutic Interventions/Progress Updates:  Skilled OT intervention completed with focus on ADL retraining. Pt received in long sitting in bed, agreeable to session. Completed bed mobility with supervision. Sit > stand with mod A using RW from bed height of 21 inches (pt reports bed height at home 27 in), then stand pivot to [facility] with mod A. Pt required assist to doff LB clothing. Completed posterior pericare with supervision using bilateral leans. Discussed potential use of bariatric [name] for promoting wider lean, however pt did well with standard. Doffed LB clothing at total A for time, then sit > stand and stand pivot using RW with mod A to seated [name]. Completed all bathing with supervision, with use of LH sponge and pt leaning on elbows to reach bottom per preference vs standing for pericare cleaning. Pt set up A for UB dress, and min A for LB dressing this session with education provided on reacher use this session. Pt requiring cues for threading technique however only required assist for mod A balance while in stance using RW and donning pants over bottom. Pt sit > stand, then stand pivot to w/c using RW and mod A. Pt left seated in w/c, with NT in room changing bed sheets, and all needs met at end of session.  Therapy Documentation Precautions:  Precautions Precautions: Fall Precaution Comments: per ortho: "WBAT R leg for transfers only. Do not want to over work her R leg give recent surgery and NWB on L leg" Required Braces or Orthoses: Splint/Cast Splint/Cast: Cast on LLE Restrictions Weight Bearing Restrictions: Yes RLE Weight Bearing: Weight bearing as tolerated (Transfer  only) LLE Weight Bearing: Non weight bearing Other Position/Activity Restrictions: RLE WBAT for transfers only  Pain: No c/o pain   Therapy/Group: Individual Therapy  Timmie Dugue E Avelina Mcclurkin 01/11/2021, 7:34 AM

## 2021-01-11 NOTE — Progress Notes (Signed)
Pt states she will not need help placing on home CPAP tonight. RT will cont to monitor.
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
## 2021-01-11 NOTE — Progress Notes (Addendum)
Physical Therapy Session Note  Patient Details  Name: Hannah Tucker MRN: [id number] Date of Birth: April 05, 1940  Today's Date: 01/11/2021 PT Individual Time: 5361-4431 and 1430-1525 PT Individual Time Calculation (min): 39 min and 55 min  Short Term Goals: Week 1:  PT Short Term Goal 1 (Week 1): STG = LTG due to ELOS  Skilled Therapeutic Interventions/Progress Updates:   Treatment Session 1 Received pt sitting in WC, pt agreeable to PT treatment, and reported pain in L ankle (premedicated) but did not state pain level. Session with emphasis on WC mobility skills, functional mobility/transfers, generalized strengthening, and improved activity tolerance. Pt performed WC mobility 1102ft using BUE and supervision with increased time through 5N hallway (including up/down mild inclines/declines) with 1 360 degree turn. Pt with short, inefficient strokes requiring cues for propulsion technique and sequencing for turns for energy conservation. Orthopedic PA present for brief assessment during session. Pt then performed the following exercises sitting in [facility] with supervision and verbal cues for technique with emphasis on LE strength/ROM with 6in step supporting RLE: -hip flexion 2x15 bilaterally  -LAQ 2x15 bilaterally Stand<>pivot WC<>recliner with RW and min A. Concluded session with pt sitting in recliner, needs within reach, and chair pad alarm on.  Treatment Session 2 Received pt sitting in recliner, pt agreeable to PT treatment, and reported "soreness" in RLE (premedicated) but did not state pain level. Session with emphasis on functional mobility/transfers, generalized strengthening, dynamic standing balance/coordination, toileting, and improved activity tolerance. Pt transferred recliner<>WC stand<>pivot with RW and min A and transported to/from room in [facility] total A. Sit<>stand with RW and CGA/min A x 4 trials and worked on dynamic standing balance clipping and unclipping clothespins to/from clothesline  for 2 minutes x 1, 1 minute x 2, 2.5 minutes x 1 using both LUE and RUE with CGA/close supervision for balance. Pt demonstrated good adherence to LLE NWB precautions overall, but occasionally TDWB on LLE with increased fatigue. Sit<>stand with RW and CGA and performed 2x12 L hip flexion to 3in step with BUE support and CGA for balance - no LOB noted. Pt required multiple rest breaks throughout session due to fatigue. Pt reported urge to toilet and transferred WC<>bedside commode<>recliner with RW and min A. Pt required max A for clothing management in standing, and able to void and perform peri-care seated with supervision via lateral leans. Concluded session with pt sitting in recliner with BLE elevated, needs within reach, and chair pad alarm on. Provided pt with fresh ice water.   Therapy Documentation Precautions:  Precautions Precautions: Fall Precaution Comments: per ortho: "WBAT R leg for transfers only. Do not want to over work her R leg give recent surgery and NWB on L leg" Required Braces or Orthoses: Splint/Cast Splint/Cast: Cast on LLE Restrictions Weight Bearing Restrictions: Yes RLE Weight Bearing: Weight bearing as tolerated (Transfer only) LLE Weight Bearing: Non weight bearing Other Position/Activity Restrictions: RLE WBAT for transfers only  Therapy/Group: Individual Therapy Martin Majestic PT, DPT   01/11/2021, 7:25 AM
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
## 2021-01-12 DIAGNOSIS — S82892B Other fracture of left lower leg, initial encounter for open fracture type I or II: Secondary | ICD-10-CM | POA: Diagnosis not present
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
## 2021-01-13 NOTE — Progress Notes (Addendum)
Physical Therapy Session Note  Patient Details  Name: Hannah Tucker MRN: [id number] Date of Birth: Aug 08, 1940  Today's Date: 01/13/2021 PT Individual Time: 0800-0900; 1352- 1436 PT Individual Time Calculation (min): 60 min , 44 min  Short Term Goals: Week 1:  PT Short Term Goal 1 (Week 1): STG = LTG due to ELOS  Skilled Therapeutic Interventions/Progress Updates:  Tx 1:  Pt seated on [name]; hand off from NT.  Pt continent of B and B.  PT doffed brief and pants and donned panties and other pants to thigh ht.  With set up, pt performed peri care in standing.  Sit> stand to RW with min assist.  Pt stood x 3 min approx for peri care and pulling panties and pants up over hips, with min assist. Pt requested upper body bathing, which she did with set up.  PT assisted pt with donning R thigh highTED.  Stand pivot transfer to wc, RW, min assist.  Pt rated pain bil LEs 5/10, and requested and received meds during session.  Wc propulsion on level tile using bil UEs, x 125' including turns, slowly with supervision.  VCs for efficient turns and 360 degree turns.   Therapeutic exercises performed with LEs to increase strength for functional mobility, seated in wc : 15 x 1 each -L long arc quad knee extension with 3 second isometric hold at end range, L hip flexion with flexed knee.  Pt able to wiggle L toes visibly.    Pt requested getting back into recliner.  Wc> recliner with min assist, RW.  At end of session, pt resting in recliner with needs at hand.  Tx 2:  Pt resting in recliner with back reclined and LEs elevated.  She rated pain R knee, L ankle, 3/10, premedicated.  Therapeutic exercise performed with LE to increase strength for functional mobility.  In recliner with LEs elevated 20 x 1 bil glut sets, R ankle pumps; 15 x 1 R straight leg raises, bil hip internal rotation, L active assistive straight leg raises.  In recliner with feet lowered: 15 x 1 each L long arc quad knee extenison, R calf  raises, marching L/R.    5 x 2 bil UE press ups attempting to clear hips with light wt bearing RLE.  Pt stated that she needed to use toilet to urinate.  Stand pivot to [facility] with min assist and RW, after mod assist to stand.  Pt continent of urine.  Peri cae in standing as above.  Pt managed clothing with max assist.  [name]> recliner with min assist.  Hand cleaning via set up.  At end of session, pt resting in wc with needs at hand and bil LEs elevated.  Therapy Documentation Precautions:  Precautions Precautions: Fall Precaution Comments: per ortho: "WBAT R leg for transfers only. Do not want to over work her R leg give recent surgery and NWB on L leg" Required Braces or Orthoses: Splint/Cast Splint/Cast: Cast on LLE Restrictions Weight Bearing Restrictions: Yes RLE Weight Bearing: Weight bearing as tolerated LLE Weight Bearing: Non weight bearing Other Position/Activity Restrictions: RLE WBAT for transfers only      Therapy/Group: Individual Therapy  Byrd Rushlow 01/13/2021, 10:15 AM

## 2021-01-13 NOTE — Progress Notes (Signed)
Pt states she will self administer her home CPAP when she is ready for rest.

## 2021-01-13 NOTE — Progress Notes (Signed)
Occupational Therapy Session Note  Patient Details  Name: Hannah Tucker MRN: [id number] Date of Birth: 26-Oct-1940  Today's Date: 01/13/2021 OT Individual Time: 4098-1191 session 1  OT Individual Time Calculation (min): 60 min session 1 Session 2: 1444-1537 ( 53 mins)   Short Term Goals: Week 1:  OT Short Term Goal 1 (Week 1): STG = LTG due to ELOS  Skilled Therapeutic Interventions/Progress Updates:  Session 1: pt greeted seated in recliner agreeable to OT intervention. Pt completed stand pivot transfer from recliner>w/c with rw and MIN A. Pt completed seated grooming tasks at sink MOD I. Pt completed w/c mobility from room >gym with MIN cues for hand placement and overall technique. Focused on functional transfer training in gym via stand pivot transfer from w/c>EOM ( EOM elevated to 27 inches to reflect height of bed at home). Pt completed x5 reps with pt able to stand pivot with overall CGA with Rw. Pt prefers to stand with BUEs on RW, discussed safety precautions that occur when pulling on Rw, pt can stand with one UE on RW and one on sitting surface however pt requires more of MIN A to transition UE's from sitting surface to Rw. Pt reports feeling more comfortable with stand pivot transfers after multiple reps, but did report increased pain in R knee, deferred further transfers as pain mgmt and provided seated rest break. Pt completed w/c mobility back to room with supervision, additional stand pivot transfer from w/c>recliner with CGA. Pt left seated in recliner with all needs within reach.   Session 2: pt greeted seated in recliner agreeable to OT intervention. Session focus on functional mobility in ADL apt and BUE therex to increase strength and overall activity tolerance for ADL transfers. Pt completed stand pivot transfer from recliner>w/c with rw and CGA. Pt completed w/c propulsion to ADL apt with supervision. Pt completed stand pivot transfer from w/c >recliner with Rw with CGA ( pt  reports liking to sit in her recliner at home). Pt reports more difficulty with trying to pivot on carpet discussed modifications for home as pt reports her rug can be removed. Pt with difficulty standing from low compliant recliner needing blankets built up on L arm rests to rise into standing, MIN A needed overall with multiple attempts. Suggested pts husband measure recliner at home to compare heights as modifications may need to be made to her recliner at home to promote functional independence with transfers. Pt reports pain/ fatigue in L knee, focused remainder of session on seated BUE strength and endurance as pain mgmt strategy. Pt completed below therex with 3.5 lb dowel rod: 2x10 bicep curls 2x10 chest presses 2x10 rows forward 2x10 OH press Pt completed w/c propulsion back to room with supervision where pt completed additionally stand pivot transfer from w/c>recliner with rw and CGA. Pt left seated in recliner with all needs within reach.    Therapy Documentation Precautions:  Precautions Precautions: Fall Precaution Comments: per ortho: "WBAT R leg for transfers only. Do not want to over work her R leg give recent surgery and NWB on L leg" Required Braces or Orthoses: Splint/Cast Splint/Cast: Cast on LLE Restrictions Weight Bearing Restrictions: Yes RLE Weight Bearing: Weight bearing as tolerated LLE Weight Bearing: Non weight bearing Other Position/Activity Restrictions: RLE WBAT for transfers only    Therapy/Group: Individual Therapy  Barron Schmid 01/13/2021, 12:23 PM

## 2021-01-14 DIAGNOSIS — S82892B Other fracture of left lower leg, initial encounter for open fracture type I or II: Secondary | ICD-10-CM | POA: Diagnosis not present

## 2021-01-14 LAB — BASIC METABOLIC PANEL
Anion gap: 10 (ref 5–15)
BUN: 11 mg/dL (ref 8–23)
CO2: 25 mmol/L (ref 22–32)
Calcium: 8.9 mg/dL (ref 8.9–10.3)
Chloride: 101 mmol/L (ref 98–111)
Creatinine, Ser: 0.79 mg/dL (ref 0.44–1.00)
GFR, Estimated: >60 mL/min (ref >60)
Glucose, Bld: 117 mg/dL — ABNORMAL HIGH (ref 70–99)
Potassium: 3.4 mmol/L — ABNORMAL LOW (ref 3.5–5.1)
Sodium: 136 mmol/L (ref 135–145)

## 2021-01-14 LAB — CBC
HCT: 31.5 % — ABNORMAL LOW (ref 36.0–46.0)
Hemoglobin: 10.1 g/dL — ABNORMAL LOW (ref 12.0–15.0)
MCH: 27.3 pg (ref 26.0–34.0)
MCHC: 32.1 g/dL (ref 30.0–36.0)
MCV: 85.1 fL (ref 80.0–100.0)
Platelets: 390 10*3/uL (ref 150–400)
RBC: 3.7 MIL/uL — ABNORMAL LOW (ref 3.87–5.11)
RDW: 15.6 % — ABNORMAL HIGH (ref 11.5–15.5)
WBC: 7.7 10*3/uL (ref 4.0–10.5)
nRBC: 0 % (ref 0.0–0.2)

## 2021-01-14 MED ORDER — POTASSIUM CHLORIDE 20 MEQ PO PACK
40.0000 meq | PACK | Freq: Once | ORAL | Status: AC
  Administered 2021-01-14: 40 meq via ORAL
  Filled 2021-01-14: qty 2

## 2021-01-14 NOTE — Progress Notes (Signed)
Occupational Therapy Session Note  Patient Details  Name: Hannah Tucker MRN: [id number] Date of Birth: [birth year]  Today's Date: 01/14/2021 OT Individual Time: 4098-1191 OT Individual Time Calculation (min): 44 min    Short Term Goals: Week 1:  OT Short Term Goal 1 (Week 1): STG = LTG due to ELOS   Skilled Therapeutic Interventions/Progress Updates:    Pt greeted at time of session supine in bed needing to use the bathroom urgently, supine > sit Supervision and stand pivot bed <> [name] with CGA with RW and good adherence to WB precautions. Min A for clothing management fully in the back. Pt wanting to attempt squat pivot to wheelchair, performed bed > wheelchair squat pivot with CGA. Set up at sink for UB/LB bathing with pt adhering to precautions and using LHS for back and RLE, able to wash buttocks in standing at sink. UB dress set up and LB dress Min A to fully get past cast. Oral hygiene Mod I from sink level. Stand pivot wheelchair > recliner CGA and alarm on call beel in reach.    Therapy Documentation Precautions:  Precautions Precautions: Fall Precaution Comments: per ortho: "WBAT R leg for transfers only. Do not want to over work her R leg give recent surgery and NWB on L leg" Required Braces or Orthoses: Splint/Cast Splint/Cast: Cast on LLE Restrictions Weight Bearing Restrictions: Yes RLE Weight Bearing: Weight bearing as tolerated LLE Weight Bearing: Non weight bearing Other Position/Activity Restrictions: RLE WBAT for transfers only     Therapy/Group: Individual Therapy  Erasmo Score 01/14/2021, 7:22 AM

## 2021-01-14 NOTE — Progress Notes (Addendum)
Per nursing, small amount of drainage noted on surgical dressing. Dressing removed and note that incision is intact, no erythema or other signs of infection and no drainage expressed. Will cover with dry dressing and reached out to Dr. Ernest Pine regarding staple removal. He recommended removing every other staple and apply steri-strip to see how wound hold up. Order written.

## 2021-01-14 NOTE — Progress Notes (Signed)
Occupational Therapy Session Note  Patient Details  Name: Hannah Tucker MRN: [id number] Date of Birth: Jul 18, 1940  Today's Date: 01/14/2021 OT Individual Time: 1449-1535 OT Individual Time Calculation (min): 46 min    Short Term Goals: Week 1:  OT Short Term Goal 1 (Week 1): STG = LTG due to ELOS  Skilled Therapeutic Interventions/Progress Updates:  Skilled OT intervention completed with focus on tub/shower transfers and AE education. Pt received seated in recliner, agreeable to session. Completed sit > stand using RW with supervision assist, then stand pivot to w/c with same assist. Transported pt with total assist in w/c for time, to 61M mock-bathroom. Completed sit > stand, then stand pivot using RW with CGA to TTB, with pt safely able to sit and manage BLEs over tub threshold with supervision. Discussed safety strategies with showers at home including how to manage shower curtain, not standing in shower, using lean technique for pericare, removing slippery rugs and having assist nearby for first couple of showers until pt becomes more comfortable with transition home. Pt receptive throughout. Transported back to pt's room, with AE education provided on sock aid and LH shoe horn. Demonstration provided, with pt able to return demonstrate setting up sock and donning sock with set up A Pt able to lean forward for donning shoe without use of shoe horn, however discussed potential use for doffing shoes. Educated where to purchase/price of the items. Pt with request to void, completed [name] transfer with RW and supervision assist. Completed pericare with supervision. Min A needed for donning pants that had fallen at ankles. Pt left seated in recliner with legs elevated, and all needs in reach at end of session.  Therapy Documentation Precautions:  Precautions Precautions: Fall Precaution Comments: per ortho: "WBAT R leg for transfers only. Do not want to over work her R leg give recent surgery and  NWB on L leg" Required Braces or Orthoses: Splint/Cast Splint/Cast: Cast on LLE Restrictions Weight Bearing Restrictions: Yes RLE Weight Bearing: Weight bearing as tolerated LLE Weight Bearing: Non weight bearing Other Position/Activity Restrictions: RLE WBAT for transfers only  Pain: No c/o pain   Therapy/Group: Individual Therapy  Zahara Rembert E Shaneequa Bahner 01/14/2021, 7:45 AM

## 2021-01-14 NOTE — Progress Notes (Signed)
Physical Therapy Session Note  Patient Details  Name: Hannah Tucker MRN: [id number] Date of Birth: [birth year]  Today's Date: 01/14/2021 PT Individual Time: 1302-1356 PT Individual Time Calculation (min): 54 min   Short Term Goals: Week 1:  PT Short Term Goal 1 (Week 1): STG = LTG due to ELOS  Skilled Therapeutic Interventions/Progress Updates:  Pt received seated in recliner in room, denied pain and was agreeable to PT. Emphasis of session on WC mobility, BUE strength and core stability. Pt declined use of toilet in beginning of session. Sit <>stand pivot from recliner to [facility] w/RW and CGA, noted good adherence to weightbearing precautions. Pt self-propelled >150' from room to ortho gym w/S*, mod verbal cues for management of WC parts and for assistance backing into elevator. Pt demonstrated slow propulsion but good hand placement on rims. Pt performed 10 minutes on SciFit level 2, 5 min fwd and 5 retro, for improved shoulder mobility and protective strengthening. Pt required short rest break 5 minutes in, educated pt on importance of shoulder rehab to reduce risk of injury due to overuse during her weightbearing restrictions, pt verbalized understanding.   Sit <>stand pivot from WC to mat w/RW and CGA and pt participated in 10 minutes of ball toss w/4# dowel without UE/LE support for improved core stability and BUE strength. Sit <>stand pivot from mat to [facility] w/CGA and RW and pt transported back to room w/total A for time management. Sit <>stand pivot from WC to recliner w/CGA and RW and pt was left seated in recliner in room, all needs in reach.   Therapy Documentation Precautions:  Precautions Precautions: Fall Precaution Comments: per ortho: "WBAT R leg for transfers only. Do not want to over work her R leg give recent surgery and NWB on L leg" Required Braces or Orthoses: Splint/Cast Splint/Cast: Cast on LLE Restrictions Weight Bearing Restrictions: Yes RLE Weight Bearing: Weight bearing  as tolerated LLE Weight Bearing: Non weight bearing Other Position/Activity Restrictions: RLE WBAT for transfers only   Therapy/Group: Individual Therapy Jill Alexanders Kourtnee Lahey, PT, DPT  01/14/2021, 7:49 AM

## 2021-01-14 NOTE — Progress Notes (Signed)
Occupational Therapy Session Note  Patient Details  Name: MALETA PACHA MRN: [id number] Date of Birth: 08/14/1940  Today's Date: 01/14/2021 OT Individual Time: 1120-1200 OT Individual Time Calculation (min): 40 min    Short Term Goals: Week 1:  OT Short Term Goal 1 (Week 1): STG = LTG due to ELOS  Skilled Therapeutic Interventions/Progress Updates:  Skilled OT intervention completed with focus on functional transfers and education/discussion on d/c plans. Pt received seated in recliner, on the phone with family, agreeable to session. Pt reported having a low day yesterday, but has had better AM session today, with report of completing most self-care/transfers with minimal assist. Pt with questions about OT f/u once d/c, and d/c date. Discussed with pt about her opinions with her CLOF, and pt and therapist in agreement about no f/u OT. Discussed potential d/c date, with primary hold due to getting ramp installed to enable pt to enter home due to lack of ambulation ability and stairs to enter, as well as pt's assist level to stand a couple days ago was mod A and pt's husband is unable to assist due to physical limitations however has improved her transfers. LPN in room for AM meds. Pt completed sit > stand with CGA using RW and stand pivot to [name] with CGA, demonstrating great improvement from mod A to CGA this session. Discussed challenges with LB dressing, with pt saying she didn't use reacher to donn pants, therefore needed assist but reports might be easier with using reacher again. Completed x20 leg raises while seated [name] on each leg. Completed x5 sit > stands using RW with bed about 24 inches (lower than 27 inch bed height at home), with CGA - close supervision. Pt demonstrated [name] to long sitting in bed mobility with supervision- demonstrating ability to lift/lower BLEs into bed this session. Pt opted to return back to recliner. Discussed plan to complete tub/shower transfer in PM session to  prepare pt for d/c. Demonstrated and explained mechanism of TTB transfer. Pt denied the need to use restroom. Pt left seated in recliner, with pt able to maneuver leg rest, with chair alarm on and all needs in reach, set up for lunch at end of session.  Therapy Documentation Precautions:  Precautions Precautions: Fall Precaution Comments: per ortho: "WBAT R leg for transfers only. Do not want to over work her R leg give recent surgery and NWB on L leg" Required Braces or Orthoses: Splint/Cast Splint/Cast: Cast on LLE Restrictions Weight Bearing Restrictions: Yes RLE Weight Bearing: Weight bearing as tolerated LLE Weight Bearing: Non weight bearing Other Position/Activity Restrictions: RLE WBAT for transfers only  Pain: No c/o pain   Therapy/Group: Individual Therapy  Elmond Poehlman E Cadarius Nevares 01/14/2021, 7:45 AM

## 2021-01-14 NOTE — Progress Notes (Signed)
Pt self administers CPAP when ready for rest.
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
## 2021-01-14 NOTE — Progress Notes (Addendum)
This nurse notified Dr. Carlis Abbott of small amount of drainage to surgical incision. Surgical dressing to RLE removed; Delle Reining, PA present. Small amount of drainage noted to surgical dressing. No signs or symptoms of infection noted. Dry dressing applied per PA orders. Patient tolerated treatment well.

## 2021-01-15 DIAGNOSIS — S82892B Other fracture of left lower leg, initial encounter for open fracture type I or II: Secondary | ICD-10-CM | POA: Diagnosis not present

## 2021-01-15 MED ORDER — OXYCODONE HCL 5 MG PO TABS
5.0000 mg | ORAL_TABLET | Freq: Four times a day (QID) | ORAL | Status: DC | PRN
  Administered 2021-01-15 – 2021-01-16 (×2): 5 mg via ORAL
  Filled 2021-01-15 (×2): qty 1

## 2021-01-15 NOTE — Progress Notes (Signed)
Occupational Therapy Session Note  Patient Details  Name: Hannah Tucker MRN: [id number] Date of Birth: 10/23/1940  Today's Date: 01/15/2021 OT Individual Time: 1305-1400 OT Individual Time Calculation (min): 55 min    Short Term Goals: Week 1:  OT Short Term Goal 1 (Week 1): STG = LTG due to ELOS  Skilled Therapeutic Interventions/Progress Updates:  Skilled OT intervention completed with focus on self-care and education with family via phone. Pt received semi-supine in bed, agreeable to session. Discussed with pt about contacting the care team, and CSW to coordinate phone call with pt's daughter. Pt requested to call daughter to inform her about unknown number coming through and daughter answered the phone. Pt asked therapist to explain pt's CLOF via phone to pt's daughter, with daughter having several questions regarding d/c, ramp installation/insurance coverage, and conflicts with coming in for family ed. Contacted CSW to provide pt's family cell numbers, then addressed pt's daughter's concerns including where to place [facility] in the home (recommended at bedside for urgency throughout the night), shower transfer assist level (supervision using TTB, and safety measures for showering) and OT recommendations for no OT f/u due to pt's status. Discussed AE used throughout self-care tasks and where to purchase. Pt's daughter reported not being able to come in for family ed, however had all questions answered about pt's transition home, other than ramp situation, during phone call. Pt/pt daughter advised to f/u with CSW. Bed mobility with supervision, then seated [name] was able to dress UB with set up, and dress LB with min A for threading of BLEs while seated due to time. Pt sit > stand using RW with supervision for donning pants over hips at min A for management over bottom, then stand pivot to recliner. Pt left seated in recliner, with BLEs elevated, all needs in reach at end of session.  Therapy  Documentation Precautions:  Precautions Precautions: Fall Precaution Comments: per ortho: "WBAT R leg for transfers only. Do not want to over work her R leg give recent surgery and NWB on L leg" Required Braces or Orthoses: Splint/Cast Splint/Cast: Cast on LLE Restrictions Weight Bearing Restrictions: Yes RLE Weight Bearing: Weight bearing as tolerated LLE Weight Bearing: Non weight bearing Other Position/Activity Restrictions: RLE WBAT for transfers only  Pain: No c/o pain   Therapy/Group: Individual Therapy  Candid Bovey E Naiara Lombardozzi 01/15/2021, 7:29 AM

## 2021-01-15 NOTE — Progress Notes (Signed)
Every other staple removed and steri-strips applied per md orders. No signs or symptoms of infection noted; peri wound clean, dry, and intact. Patient tolerated treatment well.

## 2021-01-15 NOTE — Progress Notes (Signed)
Occupational Therapy Session Note  Patient Details  Name: Hannah Tucker MRN: [id number] Date of Birth: [birth year]  Today's Date: 01/15/2021 OT Individual Time: 5053-9767 OT Individual Time Calculation (min): 60 min    Short Term Goals: Week 1:  OT Short Term Goal 1 (Week 1): STG = LTG due to ELOS Week 2:    Week 3:     Skilled Therapeutic Interventions/Progress Updates:    Patient seen for OT services, pt donn her ted hose with ModA for donning bilateral LE and feet.  The pt was able to transfer from the recliner to w/c using a stand pivot transfer incorporating the RW with CGA.  The pt was able to roll to the sink to wash her face and brush her teeth with s/u assist.  The pt was able to brush her her at the same level of function.  The pt completed UB theraex using a 2 lb dumb bell 2 sets of 15 for bicep curls, horizontal abduction, and shld flexion to improve upon her UB strength for safe and independent function with BADL related task.  The pt was able to demonstrate towel exercises to improve AROM for reach and activity tolerance.  The pt went on to complete core exercises for opposition force in various planes to improve her core strength, dynamic balance, and safety during functinal task performance. The pt returned to the recliner with CGA using her RW for stabilizing with the safety feature activated, her bedside table in close proximity, and her call light and mobile device in reach. The pt had a report of pain of 4 on 0-10 scale secondary to her incision site, nursing returned at the end on the session to remove her staple.   Therapy Documentation Precautions:  Precautions Precautions: Fall Precaution Comments: per ortho: "WBAT R leg for transfers only. Do not want to over work her R leg give recent surgery and NWB on L leg" Required Braces or Orthoses: Splint/Cast Splint/Cast: Cast on LLE Restrictions Weight Bearing Restrictions: Yes RLE Weight Bearing: Weight bearing as  tolerated LLE Weight Bearing: Non weight bearing Other Position/Activity Restrictions: RLE WBAT for transfers only General:   Vital Signs:  Pain:    Vision   Perception    Praxis   Exercises:   Other Treatments:     Therapy/Group: Individual Therapy  Lavona Mound 01/15/2021, 1:29 PM
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
## 2021-01-15 NOTE — Progress Notes (Signed)
Occupational Therapy Session Note  Patient Details  Name: Hannah Tucker MRN: [id number] Date of Birth: 1940-09-09  Today's Date: 01/15/2021 OT Individual Time: 6283-1517 OT Individual Time Calculation (min): 54 min    Short Term Goals: Week 1:  OT Short Term Goal 1 (Week 1): STG = LTG due to ELOS  Skilled Therapeutic Interventions/Progress Updates:  Skilled OT intervention completed with focus on d/c planning, addressing pt concerns with transition home, self-care tasks, and strengthening. Pt received semi-supine in bed, agreeable to session. Pt immediately reporting dissatisfaction with recommendations about OPPT vs HHPT, as well as pt/pt's daughter concern with her assist level at home. Notified care team for pt, per pt request to explore HHPT options with treating PT today, as well as CSW to schedule family ed and assist in coordinating ramp installation as this is pt's biggest barrier to going home at this time. Educated pt from OT standpoint, assist level provided- at CGA-supervision level vs previous mod A, progression of assist with self-care of Max A to now supervision-min A. Suggested pt contact daughter to encourage family ed with OT so pt and pt daughter can ask questions and feel more comfortable with pt's CLOF and transition home. Pt sit > stand using RW and stand pivot all with supervision assist to w/c. Transported with total A for time to therapy gym. Completed BUE/core strengthening exercises with 4 pound dowel while seated with back unsupported including the following: - Bicep flexion x20 -Chest presses x20 -Overhead presses x20 -Alternating side taps x20  Verbal cues and demonstration required for form and positioning throughout. Educated on importance of continuing exercises at home for BUE strength and overall endurance, with plan to provide HEP at PM session. Transported with total assist back to room, with pt requesting to toilet. Sit > stand and stand pivot using RW with  supervision assist to [facility]. Completed all pericare with supervision while in stance, required min A for donning underwear/pants over hips. Pt left seated in recliner with legs elevated and all needs in reach at end of session.   Therapy Documentation Precautions:  Precautions Precautions: Fall Precaution Comments: per ortho: "WBAT R leg for transfers only. Do not want to over work her R leg give recent surgery and NWB on L leg" Required Braces or Orthoses: Splint/Cast Splint/Cast: Cast on LLE Restrictions Weight Bearing Restrictions: Yes RLE Weight Bearing: Weight bearing as tolerated LLE Weight Bearing: Non weight bearing Other Position/Activity Restrictions: RLE WBAT for transfers only  Pain: Unrated pain of feeling "skin pulling" on R knee where dressing cover was- loosened the dressing, had pt bend knee for re-application of dressing, with improvement reported.   Therapy/Group: Individual Therapy  Ociel Retherford E Shiana Rappleye 01/15/2021, 7:28 AM

## 2021-01-15 NOTE — Progress Notes (Signed)
Physical Therapy Session Note  Patient Details  Name: Hannah Tucker MRN: [id number] Date of Birth: 10-08-1940  Today's Date: 01/15/2021 PT Individual Time: 1136-1215 PT Individual Time Calculation (min): 39 min   Short Term Goals: Week 1:  PT Short Term Goal 1 (Week 1): STG = LTG due to ELOS   Skilled Therapeutic Interventions/Progress Updates:  Patient supine in bed on entrance to room. Patient alert and agreeable to PT session.   Patient with no pain complaint throughout session.  Discussed with pt differences in [facility] and OP therapies as expressed concerned with primary care team. Pt's daughter expresses concern to pt for home health need in order to ensure appropriate strength and mobility in home setting. Reminded pt that dtr has not observed or experienced pt's high level of function already attained here in inpatient rehab and pt still has time to continue progress. Covered pt's progress and abilities since first meeting pt one week ago. This therapist expressed one shortcoming of inpatient rehab that despite preparedness, cannot see pt mobilize in actual home environment - this is where [facility] can play a role in continuation of care. To alleviate all concerned, pt can easily advocate for self and utilize [facility] for home evaluation to ensure safety and ability to safely mobilize about and in/ out of home. Pt can also advocate for quick turnaround to OP services in order to continue progress. Pt in agreement and to discuss with family. This therapist to relay information to care team.   Patient supine  in bed at end of session with brakes locked, bed alarm set, and all needs within reach.     Therapy Documentation Precautions:  Precautions Precautions: Fall Precaution Comments: per ortho: "WBAT R leg for transfers only. Do not want to over work her R leg give recent surgery and NWB on L leg" Required Braces or Orthoses: Splint/Cast Splint/Cast: Cast on LLE Restrictions Weight Bearing  Restrictions: Yes RLE Weight Bearing: Weight bearing as tolerated LLE Weight Bearing: Non weight bearing Other Position/Activity Restrictions: RLE WBAT for transfers only General:   Pain:  No pain complaint this session.   Therapy/Group: Individual Therapy  Loel Dubonnet PT, DPT 01/15/2021, 4:50 PM

## 2021-01-16 ENCOUNTER — Other Ambulatory Visit (HOSPITAL_COMMUNITY): Payer: Self-pay

## 2021-01-16 ENCOUNTER — Inpatient Hospital Stay (HOSPITAL_COMMUNITY): Payer: Medicare Other

## 2021-01-16 MED ORDER — RIVAROXABAN 10 MG PO TABS
10.0000 mg | ORAL_TABLET | Freq: Every day | ORAL | Status: DC
  Administered 2021-01-17 – 2021-01-20 (×4): 10 mg via ORAL
  Filled 2021-01-16 (×5): qty 1

## 2021-01-16 MED ORDER — OXYCODONE HCL 5 MG PO TABS
10.0000 mg | ORAL_TABLET | ORAL | Status: DC | PRN
  Administered 2021-01-16: 5 mg via ORAL
  Administered 2021-01-17 (×2): 10 mg via ORAL
  Filled 2021-01-16 (×3): qty 2

## 2021-01-16 MED ORDER — OXYCODONE HCL 5 MG PO TABS
5.0000 mg | ORAL_TABLET | ORAL | Status: DC | PRN
  Administered 2021-01-16: 5 mg via ORAL
  Filled 2021-01-16: qty 1
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
## 2021-01-16 NOTE — Progress Notes (Signed)
Physical Therapy Session Note  Patient Details  Name: Hannah Tucker MRN: [id number] Date of Birth: April 28, 1940  Today's Date: 01/16/2021 PT Individual Time: 1530-1600 PT Individual Time Calculation (min): 30 min   Short Term Goals: Week 1:  PT Short Term Goal 1 (Week 1): STG = LTG due to ELOS  Skilled Therapeutic Interventions/Progress Updates:    Pt received seated in recliner in room. Pt reports ongoing pain in R knee following therapy session this AM, nursing and medical team aware and knee has been xrayed. Pt declines any intervention for knee at this time, agreeable to seated therapy this session. Session focus on core and UB strengthening with use of 3.3# weighted ball performing punch-outs, OH lift, and L/R diagonals 2 x 10-15 reps each to fatigue. Pt left seated in recliner in room with needs in reach at end of session.  Therapy Documentation Precautions:  Precautions Precautions: Fall Precaution Comments: per ortho: "WBAT R leg for transfers only. Do not want to over work her R leg give recent surgery and NWB on L leg" Required Braces or Orthoses: Splint/Cast Splint/Cast: Cast on LLE Restrictions Weight Bearing Restrictions: Yes RLE Weight Bearing: Weight bearing as tolerated LLE Weight Bearing: Non weight bearing Other Position/Activity Restrictions: RLE WBAT for transfers only     Therapy/Group: Individual Therapy   Peter Congo, PT, DPT, CSRS  01/16/2021, 4:21 PM

## 2021-01-16 NOTE — Progress Notes (Signed)
Patient ID: [name], female   DOB: April 23, 1940, 80 y.o.   MRN: [id number]  Team Conference Report to Patient/Family  Team Conference discussion was reviewed with the patient and caregiver, including goals, any changes in plan of care and target discharge date.  Patient and caregiver express understanding and are in agreement.  The patient has a target discharge date of 01/20/21.  SW met with patient informed pt that she is medically stable for d/c. Pt reports currently a company is planning to come out to her home to take measurements over the weekend. SW will follow up with pt tomorrow.   Dyanne Iha 01/16/2021, 2:06 PM

## 2021-01-16 NOTE — Progress Notes (Signed)
Physical Therapy Session Note  Patient Details  Name: Hannah Tucker MRN: [id number] Date of Birth: 02-Jan-1941  Today's Date: 01/16/2021 PT Individual Time: 0801-0909 PT Individual Time Calculation (min): 68 min   Short Term Goals: Week 1:  PT Short Term Goal 1 (Week 1): STG = LTG due to ELOS  Skilled Therapeutic Interventions/Progress Updates:   Received pt semi-reclined in bed with RN present administering medication. Pt agreeable to PT treatment and reported pain 4/10 in RLE. Session with emphasis on discharge planning, functional mobility/transfers, generalized strengthening, dynamic standing balance/coordination, simulated car transfers, and improved activity tolerance. Pt transferred supine<>sitting [name] from flat bed without use of bedrails to simulate home environment with supervision/mod I. Stand<>pivot bed<>WC with RW and close supervision demonstrating good adherence to LLE NWB precautions. Pt transported to 16M ortho gym in [facility] total A for time management purposes and performed simulated car transfer stand<>pivot with RW and CGA/close supervision. Pt then performed BUE strengthening on UBE at level 3.5 alternating 1 minute forward and 1 minute backwards for a total of 6 minutes to fatigue. Discussed D/C plan and pt reported planning on calling ramp company today for installation but otherwise feels prepared for D/C. MD present for morning rounds and to discuss D/C plan - pt ultimately requesting to D/C Sunday due to available assistance from daughter. Pt then performed WC mobility 135ft x 2 trials using BUE and supervision back to room including navigating on/of elevator with 1 rest break on elevator. Stand<>pivot WC<>recliner with RW and supervision and donned R sock and shoe with total A per pt request. Concluded session with pt sitting in recliner on phone with ramp company, needs within reach, and chair pad alarm on. Provided pt with fresh ice water.   Therapy Documentation Precautions:   Precautions Precautions: Fall Precaution Comments: per ortho: "WBAT R leg for transfers only. Do not want to over work her R leg give recent surgery and NWB on L leg" Required Braces or Orthoses: Splint/Cast Splint/Cast: Cast on LLE Restrictions Weight Bearing Restrictions: Yes RLE Weight Bearing: Weight bearing as tolerated LLE Weight Bearing: Non weight bearing Other Position/Activity Restrictions: RLE WBAT for transfers only  Therapy/Group: Individual Therapy Martin Majestic PT, DPT   01/16/2021, 7:25 AM

## 2021-01-16 NOTE — Plan of Care (Signed)
  Problem: RH Balance Goal: LTG Patient will maintain dynamic standing with ADLs (OT) Description: LTG:  Patient will maintain dynamic standing balance with assist during activities of daily living (OT)  Flowsheets (Taken 01/16/2021 1256) LTG: Pt will maintain dynamic standing balance during ADLs with: (upgrade) Independent with assistive device Note: Upgraded due to increased strength, balance and endurance   Problem: Sit to Stand Goal: LTG:  Patient will perform sit to stand in prep for activites of daily living with assistance level (OT) Description: LTG:  Patient will perform sit to stand in prep for activites of daily living with assistance level (OT) Flowsheets (Taken 01/16/2021 1256) LTG: PT will perform sit to stand in prep for activites of daily living with assistance level: (upgrade) Independent with assistive device Note: Upgraded due to increased strength, balance and endurance   Problem: RH Bathing Goal: LTG Patient will bathe all body parts with assist levels (OT) Description: LTG: Patient will bathe all body parts with assist levels (OT) Flowsheets (Taken 01/16/2021 1256) LTG: Pt will perform bathing with assistance level/cueing: (upgraded) Independent with assistive device  Note: Upgraded due to increased strength, balance and endurance   Problem: RH Dressing Goal: LTG Patient will perform lower body dressing w/assist (OT) Description: LTG: Patient will perform lower body dressing with assist, with/without cues in positioning using equipment (OT) Flowsheets (Taken 01/16/2021 1256) LTG: Pt will perform lower body dressing with assistance level of: (upgraded) Set up assist Note: Upgraded due to increased strength, balance and endurance   Problem: RH Toileting Goal: LTG Patient will perform toileting task (3/3 steps) with assistance level (OT) Description: LTG: Patient will perform toileting task (3/3 steps) with assistance level (OT)  Flowsheets (Taken 01/16/2021  1256) LTG: Pt will perform toileting task (3/3 steps) with assistance level: (upgraded) Independent with assistive device Note: Upgraded due to increased strength, balance and endurance   Problem: RH Toilet Transfers Goal: LTG Patient will perform toilet transfers w/assist (OT) Description: LTG: Patient will perform toilet transfers with assist, with/without cues using equipment (OT) Flowsheets (Taken 01/16/2021 1256) LTG: Pt will perform toilet transfers with assistance level of: (upgraded) Independent with assistive device Note: Upgraded due to increased strength, balance and endurance   Problem: RH Tub/Shower Transfers Goal: LTG Patient will perform tub/shower transfers w/assist (OT) Description: LTG: Patient will perform tub/shower transfers with assist, with/without cues using equipment (OT) Flowsheets (Taken 01/16/2021 1256) LTG: Pt will perform tub/shower stall transfers with assistance level of: (upgraded) Independent with assistive device Note: Upgraded due to increased strength, balance and endurance

## 2021-01-16 NOTE — Patient Care Conference (Signed)
Inpatient RehabilitationTeam Conference and Plan of Care Update Date: 01/16/2021   Time: 11:35 AM    Patient Name: Hannah Tucker      Medical Record Number: [id number]  Date of Birth: 25-Feb-1941 Sex: Female         Room/Bed: 5C20C/5C20C-01 Payor Info: Payor: MEDICARE / Plan: MEDICARE PART A AND B / Product Type: *No Product type* /    Admit Date/Time:  01/08/2021  2:09 PM  Primary Diagnosis:  Open left ankle fracture  Hospital Problems: Principal Problem:   Open left ankle fracture Active Problems:   Trauma    Expected Discharge Date: Expected Discharge Date: 01/20/21  Team Members Present: Physician leading conference: Dr. Sula Soda Social Worker Present: Lavera Guise, BSW Nurse Present: Kennyth Arnold, RN PT Present: Raechel Chute, PT OT Present: Other (comment) [facility] Cheyenne Adas, OT) PPS Coordinator present : Edson Snowball, PT     Current Status/Progress Goal Weekly Team Focus  Bowel/Bladder   patient is continent of bowel and bladder  maintain continence      Swallow/Nutrition/ Hydration             ADL's   Supervision bathing, set up UBD, Min A LBD, Min A toileting, CGA sit > stand and stand pivot transfers using RW  Supervision-min A  AE education, d/c planning, HEP for BUE strengthening, endurance   Mobility   transfers with RW CGA, WC mobility 141ft supervision  supervision  Functional mobility/transfers, global deconditioning, DC planning, endurance   Communication             Safety/Cognition/ Behavioral Observations            Pain   patient complains of pain to ankle and knee which is managed by pain meds; she reports the ice does not work  continue pain mgmt with goal to decrease pain      Skin   healing stage one to sacrum, surgical incision to L ankel and R knee  maintain skin integrity        Discharge Planning:  Discharging home. Pt and daughter now they are unable to care for pt at home. SW waiting on follow up   Team  Discussion: Complains of increase pain in right knee since removal of staples. Taking pain medication before therapy. Continent B/B, reports pain 8/10 in ankle. Foam to sacrum, cast to left leg, staples removed from right knee. Waiting on ramp to be installed before discharge. Supervision with RW, transfers only. Car transfer went well. Supervision for all self-care. Patient on target to meet rehab goals: yes, can upgrade goals to mod I.  *See Care Plan and progress notes for long and short-term goals.   Revisions to Treatment Plan:  Adjusting medications and finalizing DC plans.  Teaching Needs: Family education, medication/pain management, skin/wound care, safety awareness, transfer training, etc.  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, constipation, impaired mobility and ADLs, hypertension Possible Resolutions to [employer] Focus: continue to monitor wound, increased oxycodone to 5mg  q4H prn, continue bowel regimen, increase magnesium to 500mg  HS   Continued Need for Acute  Rehabilitation Level of Care: The patient requires daily medical management by a physician with specialized training in physical medicine and rehabilitation for the following reasons: Direction of a multidisciplinary physical rehabilitation program to maximize functional independence : Yes Medical management of patient stability for increased activity during participation in an intensive  rehabilitation regime.: Yes Analysis of laboratory values and/or radiology reports with any subsequent need for medication adjustment and/or medical intervention. : Yes   I attest that I was present, lead the team conference, and concur with the assessment and plan of the team.   Cristi Loron 01/16/2021, 11:56 AM

## 2021-01-16 NOTE — Plan of Care (Signed)
  Problem: RH Balance Goal: LTG Patient will maintain dynamic standing balance (PT) Description: LTG:  Patient will maintain dynamic standing balance with assistance during mobility activities (PT) Flowsheets (Taken 01/16/2021 1208) LTG: Pt will maintain dynamic standing balance during mobility activities with:: (upgraded due to imporved endurance, strength, and adherance to WB precautions) Independent with assistive device  Note: upgraded due to imporved endurance, strength, and adherance to WB precautions   Problem: Sit to Stand Goal: LTG:  Patient will perform sit to stand with assistance level (PT) Description: LTG:  Patient will perform sit to stand with assistance level (PT) Flowsheets (Taken 01/16/2021 1208) LTG: PT will perform sit to stand in preparation for functional mobility with assistance level: (upgraded due to imporved endurance, strength, and adherance to WB precautions) Independent with assistive device Note: upgraded due to imporved endurance, strength, and adherance to WB precautions   Problem: RH Bed to Chair Transfers Goal: LTG Patient will perform bed/chair transfers w/assist (PT) Description: LTG: Patient will perform bed to chair transfers with assistance (PT). Flowsheets (Taken 01/16/2021 1208) LTG: Pt will perform Bed to Chair Transfers with assistance level: (upgraded due to imporved endurance, strength, and adherance to WB precautions) Independent with assistive device  Note: upgraded due to imporved endurance, strength, and adherance to WB precautions   Problem: RH Car Transfers Goal: LTG Patient will perform car transfers with assist (PT) Description: LTG: Patient will perform car transfers with assistance (PT). Flowsheets (Taken 01/16/2021 1208) LTG: Pt will perform car transfers with assist:: (upgraded due to imporved endurance, strength, and adherance to WB precautions) Supervision/Verbal cueing Note: upgraded due to imporved endurance, strength, and  adherance to WB precautions   Problem: RH Wheelchair Mobility Goal: LTG Patient will propel w/c in controlled environment (PT) Description: LTG: Patient will propel wheelchair in controlled environment, # of feet with assist (PT) Flowsheets (Taken 01/16/2021 1208) LTG: Pt will propel w/c in controlled environ  assist needed:: (upgraded due to imporved endurance, strength, and adherance to WB precautions) Independent with assistive device LTG: Propel w/c distance in controlled environment: 148ft Note: upgraded due to imporved endurance, strength, and adherance to WB precautions Goal: LTG Patient will propel w/c in home environment (PT) Description: LTG: Patient will propel wheelchair in home environment, # of feet with assistance (PT). Flowsheets (Taken 01/16/2021 1208) LTG: Pt will propel w/c in home environ  assist needed:: (upgraded due to imporved endurance, strength, and adherance to WB precautions) Independent with assistive device LTG: Propel w/c distance in home environment: 100ft Note: upgraded due to imporved endurance, strength, and adherance to WB precautions

## 2021-01-16 NOTE — Progress Notes (Signed)
Physical Therapy Discharge Summary  Patient Details  Name: Hannah Tucker MRN: [id number] Date of Birth: 05-12-1940  Today's Date: 01/19/2021 PT Individual Time: 7989-2119 PT Individual Time Calculation (min): 39 min   Patient has met 7 of 7 long term goals due to improved activity tolerance, improved balance, improved postural control, increased strength, increased range of motion, decreased pain, ability to compensate for deficits, improved awareness, and improved coordination. Patient to discharge at a wheelchair level Modified Independent. Patient's care partner is independent to provide the necessary physical assistance at discharge. Pt to D/C home with husband (who cannot provide any physical assist) and 2 children who can provide assist if needed.   All goals met   Recommendation:  Patient will benefit from ongoing skilled PT services in outpatient setting once WB restrictions have been lifted, to continue to advance safe functional mobility, address ongoing impairments in transfers, generalized strengthening and ROM, dynamic standing balance/coordination, gait training, stair navigation, endurance, and to minimize fall risk.  Equipment: RW, 20x16 manual WC with bilateral elevating legrests  Reasons for discharge: treatment goals met  Patient/family agrees with progress made and goals achieved: Yes   Today's Interventions: Received pt sitting in recliner asleep, pt easily aroused and agreeable to PT treatment, and reported pain 2-3/10 in R knee but has not taken pain medication and is trying to avoid taking any. Session with emphasis on discharge planning, functional mobility/transfers, generalized strengthening, dynamic standing balance/coordination, and improved activity tolerance. Pt performed x 2 stand<>pivot transfers with RW mod I during session. Pt performed WC mobility >517f using BUE with supervision/mod [facility] on UE strength and endurance.  Returned to room and reviewed HEP technique for exercises. Concluded session with pt sitting in recliner with all needs within reach.   PT Discharge Precautions/Restrictions Precautions Precautions: Fall Precaution Comments: per ortho: "WBAT R leg for transfers only. Do not want to over work her R leg give recent surgery and NWB on L leg" Required Braces or Orthoses: Splint/Cast Splint/Cast: Cast on LLE Restrictions Weight Bearing Restrictions: Yes RLE Weight Bearing: Weight bearing as tolerated LLE Weight Bearing: Non weight bearing Other Position/Activity Restrictions: RLE WBAT for transfers only Pain Interference Pain Interference Pain Effect on Sleep: 3. Frequently Pain Interference with Therapy Activities: 1. Rarely or not at all Pain Interference with Day-to-Day Activities: 1. Rarely or not at all Cognition Overall Cognitive Status: Within Functional Limits for tasks assessed Arousal/Alertness: Awake/alert Orientation Level: Oriented X4 Memory: Appears intact Awareness: Appears intact Problem Solving: Appears intact Safety/Judgment: Appears intact Comments: good adherance to WB precautions Sensation Sensation Light Touch: Impaired by gross assessment Proprioception: Appears Intact Additional Comments: absent sensation along L cast Coordination Gross Motor Movements are Fluid and Coordinated: Yes Finger Nose Finger Test: generalized weakness/deconditioning Motor  Motor Motor: Within Functional Limits Motor - Skilled Clinical Observations: generalized weakness/deconditioning, altered balance strategies due to LLE NWB precautions  Mobility Bed Mobility Bed Mobility: Rolling Right;Rolling Left;Supine to Sit;Sit to Supine Rolling Right: Independent Rolling Left: Independent Supine to Sit: Independent Sit to Supine: Independent Transfers Transfers: Sit to Stand;Stand to Sit;Stand Pivot Transfers Sit to Stand: Independent with assistive device Stand to Sit:  Independent with assistive device Stand Pivot Transfers: Independent with assistive device Transfer (Assistive device): Rolling walker Locomotion  Gait Ambulation: No (transfers only) Gait Gait: No Stairs / Additional Locomotion Stairs: No Wheelchair Mobility Wheelchair Mobility: Yes Wheelchair Assistance: Independent with aCamera operator Both upper extremities Wheelchair Parts Management: Needs assistance Distance: 1542f  Trunk/Postural Assessment  Cervical Assessment Cervical Assessment: Exceptions to [facility] (forward head) Thoracic Assessment Thoracic Assessment: Exceptions to [facility] (rounded shoulders) Lumbar Assessment Lumbar Assessment: Exceptions to [facility] (posterior pelvic tilt) Postural Control Postural Control: Within Functional Limits  Balance Balance Balance Assessed: Yes Static Sitting Balance Static Sitting - Balance Support: Feet supported;No upper extremity supported Static Sitting - Level of Assistance: 7: Independent Dynamic Sitting Balance Dynamic Sitting - Balance Support: Feet supported;No upper extremity supported Dynamic Sitting - Level of Assistance: 6: Modified independent (Device/Increase time) Static Standing Balance Static Standing - Balance Support: Bilateral upper extremity supported (RW) Static Standing - Level of Assistance: 6: Modified independent (Device/Increase time) Dynamic Standing Balance Dynamic Standing - Balance Support: Bilateral upper extremity supported (RW) Dynamic Standing - Level of Assistance: 6: Modified independent (Device/Increase time) Dynamic Standing - Comments: with transfers Extremity Assessment  RLE Assessment RLE Assessment: Exceptions to [facility] Strength Comments: grossly generalized to 4/5 LLE Assessment LLE Assessment: Exceptions to [facility] LLE Strength Left Hip Flexion: 4-/5 Left Hip ABduction: 4-/5 Left Hip ADduction: 4-/5 Left Knee Flexion: 4-/5 Left Knee Extension: 4-/5  Alfonse Alpers PT, DPT  01/16/2021, 7:43 AM

## 2021-01-16 NOTE — Progress Notes (Signed)
Physical Therapy Session Note  Patient Details  Name: Hannah Tucker MRN: [id number] Date of Birth: [birth year]  Today's Date: 01/16/2021 PT Individual Time: 0955-1030 PT Individual Time Calculation (min): 35 min   Short Term Goals: Week 1:  PT Short Term Goal 1 (Week 1): STG = LTG due to ELOS  Skilled Therapeutic Interventions/Progress Updates:     Patient in recliner in the room on the phone, reports that she is arranging for a ramp to built at this time. Patient missed 10 min of skilled PT due to d/c arrangements, RN made aware. Will attempt to make-up missed time as able.  Patient alert and agreeable to PT session upon PT return. Patient reported 7/10 R knee pain during session, RN made aware. PT provided repositioning, rest breaks, and distraction as pain interventions throughout session.   Patient hyperverbal throughout session. Discussed ramp placement, recommended specifications, and plans for possible installation on Saturday prior to d/c. Educated patient on options to bump up steps in the w/c or medical transport with possible financial burden to d/c home without ramp in place, patient relieved to know she would still be able to d/c if the ramp was not yet installed.   Patient with increased R lower extremity edema and reporting pain in her foot from her shoe feeling tight. Doffed shoe and sock with total A, noted mild pitting edema of R foot and ankle. Retrieved thigh high TEDs and donned R TED hose with mod A for placement over patient's foot, patient able to pull it up from there. Educated patient on importance of elevation, compression from TED hose, and performing R ankle pumps throughout the day to reduce lower extremity edema.  Therapeutic Activity: Transfers: Patient performed sit to/from stand x4 with CGA-close supervision using RW. Patient able to teach back cues for hand placement and maintain NWB on L throughout. Patient stood 3x45 sec focused on erect posture, increased  gluteal activation, and progressing to single upper extremity support.   Patient in recliner in the room handed off to [place], OT at end of session.   Therapy Documentation Precautions:  Precautions Precautions: Fall Precaution Comments: per ortho: "WBAT R leg for transfers only. Do not want to over work her R leg give recent surgery and NWB on L leg" Required Braces or Orthoses: Splint/Cast Splint/Cast: Cast on LLE Restrictions Weight Bearing Restrictions: Yes RLE Weight Bearing: Weight bearing as tolerated LLE Weight Bearing: Non weight bearing Other Position/Activity Restrictions: RLE WBAT for transfers only General: PT Amount of Missed Time (min): 10 Minutes PT Missed Treatment Reason: Other (Comment) (patient phone call for ramp placement)    Therapy/Group: Individual Therapy  Tauheedah Bok L Tranise Forrest PT, DPT  01/16/2021, 12:24 PM

## 2021-01-16 NOTE — TOC Benefit Eligibility Note (Signed)
Patient Product/process development scientist completed.    The patient is currently admitted and upon discharge could be taking Eliquis 2.5 mg.  The current 30 day co-pay is, $72.30.   The patient is currently admitted and upon discharge could be taking Xarelto 10 mg.  The current 30 day co-pay is, $72.30.   The patient is insured through [employer]     Roland Earl, CPhT Pharmacy Patient Advocate Specialist [facility] Patient Advocate Team Direct Number: [phone]  Fax: [phone]

## 2021-01-16 NOTE — Progress Notes (Signed)
Occupational Therapy Session Note  Patient Details  Name: Hannah Tucker MRN: [id number] Date of Birth: 04/20/1940  Today's Date: 01/16/2021 OT Individual Time: 1033-1130 OT Individual Time Calculation (min): 57 min    Short Term Goals: Week 1:  OT Short Term Goal 1 (Week 1): STG = LTG due to ELOS  Skilled Therapeutic Interventions/Progress Updates:  Skilled OT intervention completed with focus on ADL retraining. Pt received upright in bed, agreeable to session. Requested assist to bathroom. Bed mobility with supervision, sit > stand using RW and stand pivot to [facility] with CGA. Pt able to doff clothing with supervision. Continent void episode. Pt able to complete all pericare with supervision in stance, required min A to donn pants from ankles to over hips. Seated [name] pt completed all bathing tasks with supervision using LH sponge. Pt able to complete dressing tasks with forward/lateral leans this session, and was able to complete all dressing with supervision without AE, but verbalized she would use at home for independence and ease of access. Pt c/o R knee pain upon standing. Pt opted to stay in recliner. Placed in a position of comfort, applied ice to R knee, with pt completing grooming tasks with set up A. Pt left seated in recliner with leg rests elevated, and all needs in reach at end of session.    Therapy Documentation Precautions:  Precautions Precautions: Fall Precaution Comments: per ortho: "WBAT R leg for transfers only. Do not want to over work her R leg give recent surgery and NWB on L leg" Required Braces or Orthoses: Splint/Cast Splint/Cast: Cast on LLE Restrictions Weight Bearing Restrictions: Yes RLE Weight Bearing: Weight bearing as tolerated LLE Weight Bearing: Non weight bearing Other Position/Activity Restrictions: RLE WBAT for transfers only  Pain: Unrated pain in R knee, applied ice and provided position of comfort   Therapy/Group: Individual Therapy  Hannah Tucker  Hannah Tucker 01/16/2021, 7:52 AM

## 2021-01-17 ENCOUNTER — Other Ambulatory Visit (HOSPITAL_COMMUNITY): Payer: Self-pay

## 2021-01-17 DIAGNOSIS — S82892B Other fracture of left lower leg, initial encounter for open fracture type I or II: Secondary | ICD-10-CM | POA: Diagnosis not present

## 2021-01-17 MED ORDER — OXYCODONE HCL 5 MG PO TABS
5.0000 mg | ORAL_TABLET | ORAL | Status: DC | PRN
  Administered 2021-01-17 – 2021-01-18 (×4): 5 mg via ORAL
  Filled 2021-01-17 (×7): qty 1

## 2021-01-17 MED ORDER — RIVAROXABAN 10 MG PO TABS
10.0000 mg | ORAL_TABLET | Freq: Every day | ORAL | 0 refills | Status: DC
  Filled 2021-01-17: qty 30, 30d supply, fill #0

## 2021-01-17 NOTE — Discharge Instructions (Addendum)
       Inpatient Rehab Discharge Instructions  Hannah Tucker Discharge date and time: 01/20/21   Activities/Precautions/ Functional Status: Activity: no lifting, driving, or strenuous exercise till cleared by MD Diet: regular diet Wound Care: keep wound clean and dry. Contact MD if you develop any problems with your incision/wound--redness, swelling, increase in pain, drainage or if you develop fever or chills.     Functional status:  ___ No restrictions     ___ Walk up steps independently _X__ 24/7 supervision/assistance   ___ Walk up steps with assistance ___ Intermittent supervision/assistance  ___ Bathe/dress independently __X_ Transfer with walker    _X__ Bathe/dress with assistance ___ Walk Independently    ___ Shower independently ___ Walk with assistance    ___ Shower with assistance _X__ No alcohol     ___ Return to work/school ________  Special Instructions: No weight on left leg. Keep splint clean and dry.  2. Wear support stocking during the day to help with swelling right leg. Keep leg elevated when seated.  3. NO walking till cleared by Dr. Carola Frost   My questions have been answered and I understand these instructions. I will adhere to these goals and the provided educational materials after my discharge from the hospital.  Patient/Caregiver Signature _______________________________ Date __________  Clinician Signature _______________________________________ Date __________  Please bring this form and your medication list with you to all your follow-up doctor's appointments.  Information on my medicine - XARELTO (Rivaroxaban)  Why was Xarelto prescribed for you? Xarelto was prescribed for you to reduce the risk of blood clots forming after orthopedic surgery. The medical term for these abnormal blood clots is venous thromboembolism (VTE).  What do you need to know about xarelto ? Take your Xarelto ONCE DAILY at the same time every day. You may take it  either with or without food.  If you have difficulty swallowing the tablet whole, you may crush it and mix in applesauce just prior to taking your dose.  Take Xarelto exactly as prescribed by your doctor and DO NOT stop taking Xarelto without talking to the doctor who prescribed the medication.  Stopping without other VTE prevention medication to take the place of Xarelto may increase your risk of developing a clot.  After discharge, you should have regular check-up appointments with your healthcare provider that is prescribing your Xarelto.    What do you do if you miss a dose? If you miss a dose, take it as soon as you remember on the same day then continue your regularly scheduled once daily regimen the next day. Do not take two doses of Xarelto on the same day.   Important Safety Information A possible side effect of Xarelto is bleeding. You should call your healthcare provider right away if you experience any of the following: Bleeding from an injury or your nose that does not stop. Unusual colored urine (red or dark brown) or unusual colored stools (red or black). Unusual bruising for unknown reasons. A serious fall or if you hit your head (even if there is no bleeding).  Some medicines may interact with Xarelto and might increase your risk of bleeding while on Xarelto. To help avoid this, consult your healthcare provider or pharmacist prior to using any new prescription or non-prescription medications, including herbals, vitamins, non-steroidal anti-inflammatory drugs (NSAIDs) and supplements.  This website has more information on Xarelto: VisitDestination.com.br.

## 2021-01-17 NOTE — Progress Notes (Addendum)
Occupational Therapy Session Note  Patient Details  Name: Hannah Tucker MRN: [id number] Date of Birth: 20-Jan-1941  Today's Date: 01/17/2021 OT Individual Time: [phone] OT Individual Time Calculation (min): 30 Mins   Short Term Goals: Week 1:  OT Short Term Goal 1 (Week 1): STG = LTG due to ELOS Week 2:    Week 3:     Skilled Therapeutic Interventions/Progress Updates:    Upon introducing myself to the pt and setting up her environment for treatment, the pt was able to demonstrate functional transfer by coming rom the reliner to the w/c with CGA using her RW to for balance. The pt was able to demonstrate good safety awareness for transfers.   Therapy Documentation Precautions:  Precautions Precautions: Fall Precaution Comments: per ortho: "WBAT R leg for transfers only. Do not want to over work her R leg give recent surgery and NWB on L leg" Required Braces or Orthoses: Splint/Cast Splint/Cast: Cast on LLE Restrictions Weight Bearing Restrictions: Yes RLE Weight Bearing: Weight bearing as tolerated LLE Weight Bearing: Non weight bearing Other Position/Activity Restrictions: RLE WBAT for transfers only General:   Vital Signs:  Pain:   ADL: Vision   Perception    Praxis   Exercises:   Other Treatments:     Therapy/Group: Individual Therapy  Lavona Mound 01/17/2021, 12:43 PM
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
## 2021-01-17 NOTE — Progress Notes (Signed)
Occupational Therapy Session Note  Patient Details  Name: Hannah Tucker MRN: [id number] Date of Birth: December 17, 1940  Today's Date: 01/17/2021 OT Individual Time: 1015-1058 OT Individual Time Calculation (min): 43 min    Short Term Goals: Week 1:  OT Short Term Goal 1 (Week 1): STG = LTG due to ELOS  Skilled Therapeutic Interventions/Progress Updates:  Pt handed off from OT agreeable to OT intervention. Session focus on BUE strength and endurance and BADL reeducation. Pt completed therex as indicated below with level 3 theraband from sitting in w/c: X10 bicep curls X10 chest pulls X10 shoulder flexion X10 diagnaol pulls  X10 Shoulder extensions X10 alterantign punches X10 shoulder external rotation with anchored resistance. Issud pt HEP to increase carryover. Pt reprots need to void bladder, supervision for stand pivot transer from w/c>[name] with rw, set- up assist for 3/3 toileting tasks. Pt completed stand pivot from [name]>recliner with rw and supervision.    pt left seated in recliner with all needs wtihin reach.            Therapy Documentation Precautions:  Precautions Precautions: Fall Precaution Comments: per ortho: "WBAT R leg for transfers only. Do not want to over work her R leg give recent surgery and NWB on L leg" Required Braces or Orthoses: Splint/Cast Splint/Cast: Cast on LLE Restrictions Weight Bearing Restrictions: Yes RLE Weight Bearing: Weight bearing as tolerated LLE Weight Bearing: Non weight bearing Other Position/Activity Restrictions: RLE WBAT for transfers only  Pain: pt reports unrated pain in R knee, provided rest breaks as needed.     Therapy/Group: Individual Therapy  Pollyann Glen [facility] 01/17/2021, 12:04 PM

## 2021-01-17 NOTE — Progress Notes (Signed)
Physical Therapy Session Note  Patient Details  Name: Hannah Tucker MRN: [id number] Date of Birth: 20-Sep-1940  Today's Date: 01/17/2021 PT Individual Time: 1300-1410 PT Individual Time Calculation (min): 70 min   Short Term Goals: Week 1:  PT Short Term Goal 1 (Week 1): STG = LTG due to ELOS  Skilled Therapeutic Interventions/Progress Updates:     Patient in recliner in the room upon PT arrival. Patient alert and agreeable to PT session. Patient denied pain during session, reports RN provided meds ~30 min prior to session.   Therapeutic Activity: Bed Mobility: Patient performed supine to/from sit with mod I for increased time in a flat bed without use of bed rails.  Transfers: Patient performed stand pivot recliner>w/c, bed set to 28" to simulate home set-up to recliner, and recliner to [name] using RW and squat pivot w/c>bed all with supervision. Patient able to maintain L NWB independently, tried alternate hand placement to encourage R weight shift to come to standing, however, patient prefers to push up with her L hand and is able to do so safely.    Wheelchair Mobility:  Patient propelled wheelchair up/down a 10 foot ramp x2 with min A-CGA. Provided verbal cues and demonstration for propelling with B upper extremities and R lower extremity backwards for energy conservation while ascending and forwards while descending.   Therapeutic Exercise: Patient performed the following exercises from HEP handout prepared during session with verbal and tactile cues for proper technique. Access Code: [email or web address] Supine Ankle Pumps - 3 x daily - 7 x weekly - 1 sets - 10 reps Supine Quadricep Sets - 3 x daily - 7 x weekly - 1 sets - 10 reps Supine Quad Set - 3 x daily - 7 x weekly - 1 sets - 10 reps Supine Heel Slide - 3 x daily - 7 x weekly - 1 sets - 10 reps Supine Straight Leg Raises - 3 x daily - 7 x weekly - 1 sets - 10 reps Supine Hip Abduction AROM - 3 x daily - 7 x weekly - 1 sets - 10  reps Seated Long Arc Quad - 3 x daily - 7 x weekly - 1 sets - 10 reps Seated Heel Slide - 3 x daily - 7 x weekly - 1 sets - 10 reps Seated Knee Flexion AAROM - 3 x daily - 7 x weekly - 1 sets - 10 reps  Patient on [facility] with call bell in hand and NT aware at end of session..   Therapy Documentation Precautions:  Precautions Precautions: Fall Precaution Comments: per ortho: "WBAT R leg for transfers only. Do not want to over work her R leg give recent surgery and NWB on L leg" Required Braces or Orthoses: Splint/Cast Splint/Cast: Cast on LLE Restrictions Weight Bearing Restrictions: Yes RLE Weight Bearing: Weight bearing as tolerated LLE Weight Bearing: Non weight bearing Other Position/Activity Restrictions: RLE WBAT for transfers only    Therapy/Group: Individual Therapy  Fallan Mccarey L Viren Lebeau PT, DPT  01/17/2021, 4:24 PM

## 2021-01-17 NOTE — Progress Notes (Signed)
Physical Therapy Session Note  Patient Details  Name: NICHOLAS OSSA MRN: [id number] Date of Birth: Sep 14, 1940  Today's Date: 01/17/2021 PT Individual Time: 0802-0856 PT Individual Time Calculation (min): 54 min   Short Term Goals: Week 1:  PT Short Term Goal 1 (Week 1): STG = LTG due to ELOS  Skilled Therapeutic Interventions/Progress Updates:   Received pt semi-reclined in bed, pt agreeable to PT treatment, and reported pain 3/10 in R knee but motivated to work through it. Pt reported increased discomfort in R knee after yesterday's afternoon PT session but reported feeling better today. Session with emphasis on functional mobility/transfers, generalized strengthening, dynamic standing balance/coordination, and improved activity tolerance. Pt transferred supine<>sitting [name] from flat bed without use of bedrails and mod I. Donned R ted hose with max A and pt donned R shoe with supervision. Adjusted height of RW for comfort and pt transferred stand<>pivot bed<>WC with RW and supervision with good adherence to LLE NWB precautions. Pt transferred on/off Nustep with RW and supervision and performed BUE and RLE strengthening on workload 3 for 8 minutes for a total of 297 steps with emphasis on cardiovascular endurance and RLE ROM. Pt performed the following exercises sitting in WC with supervision and verbal cues for technique: - hip flexion 2x20 bilaterally - hip abduction with yellow TB 2x20 Pt transported back to room in [facility] total A and MD present for morning rounds. Pt transferred WC<>recliner stand<>pivot with RW and supervision. Concluded session with pt sitting in recliner, needs within reach, and chair pad alarm on.   Therapy Documentation Precautions:  Precautions Precautions: Fall Precaution Comments: per ortho: "WBAT R leg for transfers only. Do not want to over work her R leg give recent surgery and NWB on L leg" Required Braces or Orthoses: Splint/Cast Splint/Cast: Cast on  LLE Restrictions Weight Bearing Restrictions: Yes RLE Weight Bearing: Weight bearing as tolerated LLE Weight Bearing: Non weight bearing Other Position/Activity Restrictions: RLE WBAT for transfers only  Therapy/Group: Individual Therapy Martin Majestic PT, DPT   01/17/2021, 7:26 AM

## 2021-01-18 ENCOUNTER — Other Ambulatory Visit (HOSPITAL_COMMUNITY): Payer: Self-pay

## 2021-01-18 DIAGNOSIS — D62 Acute posthemorrhagic anemia: Secondary | ICD-10-CM

## 2021-01-18 DIAGNOSIS — E876 Hypokalemia: Secondary | ICD-10-CM

## 2021-01-18 DIAGNOSIS — S82892B Other fracture of left lower leg, initial encounter for open fracture type I or II: Secondary | ICD-10-CM | POA: Diagnosis not present

## 2021-01-18 DIAGNOSIS — K5900 Constipation, unspecified: Secondary | ICD-10-CM

## 2021-01-18 MED ORDER — CALCIUM POLYCARBOPHIL 625 MG PO TABS: 625.0000 mg | ORAL_TABLET | Freq: Every day | ORAL | 0 refills | Status: AC

## 2021-01-18 MED ORDER — DOCUSATE SODIUM 100 MG PO CAPS: 100.0000 mg | ORAL_CAPSULE | Freq: Two times a day (BID) | ORAL | 0 refills | Status: AC

## 2021-01-18 MED ORDER — KETOROLAC TROMETHAMINE 0.5 % OP SOLN: 1.0000 [drp] | Freq: Three times a day (TID) | OPHTHALMIC | 0 refills | Status: AC | PRN

## 2021-01-18 MED ORDER — RIVAROXABAN 10 MG PO TABS: 10.0000 mg | ORAL_TABLET | Freq: Every day | ORAL | 0 refills | Status: DC

## 2021-01-18 MED ORDER — METHOCARBAMOL 500 MG PO TABS: 500.0000 mg | ORAL_TABLET | Freq: Three times a day (TID) | ORAL | 0 refills | Status: AC | PRN

## 2021-01-18 MED ORDER — OXYCODONE HCL 5 MG PO TABS: 5.0000 mg | ORAL_TABLET | Freq: Four times a day (QID) | ORAL | 0 refills | Status: DC | PRN

## 2021-01-18 MED ORDER — MAGNESIUM GLUCONATE 500 MG PO TABS: 250.0000 mg | ORAL_TABLET | Freq: Every day | ORAL | 0 refills | Status: AC
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
## 2021-01-18 NOTE — Progress Notes (Signed)
Physical Therapy Session Note  Patient Details  Name: Hannah Tucker MRN: [id number] Date of Birth: 02-21-1941  Today's Date: 01/18/2021 PT Individual Time: 0801-0857 PT Individual Time Calculation (min): 56 min   Short Term Goals: Week 1:  PT Short Term Goal 1 (Week 1): STG = LTG due to ELOS  Skilled Therapeutic Interventions/Progress Updates:   Received pt sitting in WC, pt agreeable to PT treatment, and reported pain 5/10 in R knee. RN notified and present to administer pain medication. Therapist assisted pt in making phone call to ask primary care MD about recommended medications from Dr. Carlis Abbott. Session with emphasis on functional mobility/transfers, generalized strengthening, dynamic standing balance/coordination, WC mobility, and improved activity tolerance. Provided pt with HEP and educated on frequency/duration/technique for exercises. Donned knee high compression sock with max A and R shoe with supervision and pt performed WC mobility >536ft using BUE and supervision through [employer] - cues to increase propulsion stride for more efficient strokes. Pt then performed the following exercises sitting in [facility] with supervision and verbal cues for technique with emphasis on UE/LE strength: -WC pushups 2x8 -LAQ 2x10 with 4lb ankle weight on RLE Pt transported back to room in [facility] total A and transferred WC<>recliner stand<>pivot with RW and supervision. Concluded session with pt sitting in recliner, needs within reach, and chair pad alarm on.   Therapy Documentation Precautions:  Precautions Precautions: Fall Precaution Comments: per ortho: "WBAT R leg for transfers only. Do not want to over work her R leg give recent surgery and NWB on L leg" Required Braces or Orthoses: Splint/Cast Splint/Cast: Cast on LLE Restrictions Weight Bearing Restrictions: Yes RLE Weight Bearing: Weight bearing as tolerated LLE Weight Bearing: Non weight bearing Other Position/Activity Restrictions:  RLE WBAT for transfers only   Therapy/Group: Individual Therapy Martin Majestic PT, DPT   01/18/2021, 7:30 AM

## 2021-01-18 NOTE — Plan of Care (Signed)
  Problem: Consults Goal: RH GENERAL PATIENT EDUCATION Description: See Patient Education module for education specifics. Outcome: Progressing Goal: Skin Care Protocol Initiated - if Braden Score 18 or less Description: If consults are not indicated, leave blank or document N/A Outcome: Progressing   Problem: RH BOWEL ELIMINATION Goal: RH STG MANAGE BOWEL WITH ASSISTANCE Description: STG Manage Bowel with Supervision Assistance. Outcome: Progressing Goal: RH STG MANAGE BOWEL W/MEDICATION W/ASSISTANCE Description: STG Manage Bowel with Medication with Supervision Assistance. Outcome: Progressing   Problem: RH SKIN INTEGRITY Goal: RH STG MAINTAIN SKIN INTEGRITY WITH ASSISTANCE Description: STG Maintain Skin Integrity With Supervision Assistance. Outcome: Progressing Goal: RH STG ABLE TO PERFORM INCISION/WOUND CARE W/ASSISTANCE Description: STG Able To Perform Incision/Wound Care With Supervision Assistance. Outcome: Progressing   Problem: RH SAFETY Goal: RH STG ADHERE TO SAFETY PRECAUTIONS W/ASSISTANCE/DEVICE Description: STG Adhere to Safety Precautions With cues and reminders. Outcome: Progressing Goal: RH STG DECREASED RISK OF FALL WITH ASSISTANCE Description: STG Decreased Risk of Fall With Supervision Assistance. Outcome: Progressing   Problem: RH PAIN MANAGEMENT Goal: RH STG PAIN MANAGED AT OR BELOW PT'S PAIN GOAL Description: < 3 on a 0-10 pain scale. Outcome: Progressing   Problem: RH KNOWLEDGE DEFICIT GENERAL Goal: RH STG INCREASE KNOWLEDGE OF SELF CARE AFTER HOSPITALIZATION Description: Patient will demonstrate knowledge of medication/pain management, skin/wound care, safety precautions, and weight bearing precautions with educational materials and handouts provided by staff independently at discharge. Outcome: Progressing

## 2021-01-18 NOTE — Progress Notes (Signed)
Occupational Therapy Session Note  Patient Details  Name: Hannah Tucker MRN: [id number] Date of Birth: 1940/03/06  Today's Date: 01/18/2021 OT Individual Time: 3735-7897 OT Individual Time Calculation (min): 70 min    Short Term Goals: Week 1:  OT Short Term Goal 1 (Week 1): STG = LTG due to ELOS  Skilled Therapeutic Interventions/Progress Updates:  Skilled OT intervention completed with focus on ADL retraining. Pt received seated in recliner with RN present administering meds. Education provided about [facility] placement in the home for ease of use/safety upon d/c. Pt opted to complete shower this session. Sit > stand and stand pivot > w/c using RW with supervision. Completed toilet transfer with supervision to [facility] over toilet, with supervision for toileting task then shower transfer with same level of assist. Covered L cast and R knee, education provided on wrapping technique for home use. Completed all bathing with set up assist using LH sponge. Education provided about safety tips for showers once d/c to home. Pt receptive and in agreement. Completed UB dressing with set up A, LB dressing with min A for threading while on TTB, then donned over hips in stance. Safety cues required for positioning in tight space. Seated in recliner, pt completed BUE strengthening exercises to address activity tolerance/strength needed for functional transfers, with 4 pound dowel including the following:  -Bicep flexion 2x15 -Chest presses 2x15 -Overhead presses 2x15 -Alternating rows x20  Cues required for form and positioning throughout. Pt left seated in recliner, with legs elevated, chair alarm on and all needs in reach at end of session.  Therapy Documentation Precautions:  Precautions Precautions: Fall Precaution Comments: per ortho: "WBAT R leg for transfers only. Do not want to over work her R leg give recent surgery and NWB on L leg" Required Braces or Orthoses: Splint/Cast Splint/Cast: Cast on  LLE Restrictions Weight Bearing Restrictions: Yes RLE Weight Bearing: Weight bearing as tolerated LLE Weight Bearing: Non weight bearing Other Position/Activity Restrictions: RLE WBAT for transfers only  Pain: No c/o pain   Therapy/Group: Individual Therapy  Waylon Hershey E Thaila Bottoms 01/18/2021, 7:54 AM

## 2021-01-18 NOTE — Progress Notes (Signed)
Pt has home CPAP unit. States she does not need help but will call.
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
## 2021-01-18 NOTE — Progress Notes (Signed)
Physical Therapy Session Note  Patient Details  Name: CALEIGH RABELO MRN: [id number] Date of Birth: Dec 02, 1940  Today's Date: 01/18/2021 PT Individual Time: 1031-1130 PT Individual Time Calculation (min): 59 min   Short Term Goals: Week 1:  PT Short Term Goal 1 (Week 1): STG = LTG due to ELOS  Skilled Therapeutic Interventions/Progress Updates:  Patient seated in recliner with BLE elevated on entrance to room. Patient alert and agreeable to PT session. Relates that she did not complete all tasks in earlier PT session and dtr continues to express concern re: pt's ability to transfer to toilet in home.   Patient with no pain complaint throughout session.  Therapeutic Activity: Transfers: Patient performed sit<>stand and stand pivot transfers throughout session with supervision/ intermittent CGA. Focus this session on car transfer requiring visual demonstration prior and consistent vc throughout but supervision to complete. Pt also provided therapist with sketch of bathroom attached to bedroom at home - same bathroom dtr is most concerned re: as pt states dtr will not want to empty [name]. Pt setup with need for 180 deg turn to toilet and is able to perform using RW with supervision/ intermittent CGA with pivot turn on RLE. Good technique and no vc required during performance.   Wheelchair Mobility:  Patient propelled wheelchair into and out of elevator over uneven threshold covering distance from elevator into ortho gym and completing 90 deg turns with wide approach with supervision. Provided vc for ensuring front wheels of w/c are perpendicular to tracks of elevator doors. Pt also relates existence of braided rugs in living room/ den. Discussion and education with pt re: weight shift required in order to adjust weight distribution over wheels of w/c in order to self propel over hard change in height of braided rug. Forward and backward weight shift discussed in order to improve ability to bring  front wheels onto/ off of rug.   Patient seated upright in recliner with BLE elevated at end of session with brakes locked, belt alarm set, and all needs within reach.     Therapy Documentation Precautions:  Precautions Precautions: Fall Precaution Comments: per ortho: "WBAT R leg for transfers only. Do not want to over work her R leg give recent surgery and NWB on L leg" Required Braces or Orthoses: Splint/Cast Splint/Cast: Cast on LLE Restrictions Weight Bearing Restrictions: Yes RLE Weight Bearing: Weight bearing as tolerated LLE Weight Bearing: Non weight bearing Other Position/Activity Restrictions: RLE WBAT for transfers only General:   Pain: Pain Assessment Pain Score: 0-No pain  Therapy/Group: Individual Therapy  Loel Dubonnet PT, DPT 01/18/2021, 12:35 PM
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
## 2021-01-18 NOTE — Progress Notes (Signed)
Occupational Therapy Discharge Summary  Patient Details  Name: Hannah Tucker MRN: [id number] Date of Birth: 14-Dec-1940  Patient has met 7 of 7 long term goals due to improved activity tolerance, improved balance, postural control, ability to compensate for deficits, improved attention, and improved awareness.  Patient to discharge at overall Modified Independent level.  Patient's husband and daughter are able to provide the necessary intermittent supervision assistance at discharge.    Reasons goals not met: n/a  Recommendation:  No OT f/u recommended  Equipment: TTB  Reasons for discharge: treatment goals met  Patient/family agrees with progress made and goals achieved: Yes  OT Discharge Precautions/Restrictions  Precautions Precautions: Fall Precaution Comments: per ortho: "WBAT R leg for transfers only. Do not want to over work her R leg give recent surgery and NWB on L leg" Required Braces or Orthoses: Splint/Cast Splint/Cast: Cast on LLE Restrictions Weight Bearing Restrictions: Yes RLE Weight Bearing: Weight bearing as tolerated LLE Weight Bearing: Non weight bearing Other Position/Activity Restrictions: RLE WBAT for transfers only ADL ADL Equipment Provided: Long-handled sponge Eating: Independent Where Assessed-Eating: Chair Grooming: Modified independent Where Assessed-Grooming: Wheelchair, Sitting at sink Upper Body Bathing: Setup Where Assessed-Upper Body Bathing: Shower Lower Body Bathing: Setup Where Assessed-Lower Body Bathing: Shower Upper Body Dressing: Setup Where Assessed-Upper Body Dressing: Edge of bed Lower Body Dressing: Supervision/safety Where Assessed-Lower Body Dressing: Edge of bed Toileting: Supervision/safety Where Assessed-Toileting: Bedside Commode Toilet Transfer: Close supervision Toilet Transfer Method: Stand pivot (RW) Science writer: Radiographer, therapeutic: Close supervison Clinical cytogeneticist Method:  Librarian, academic: Radio broadcast assistant, Energy manager: Close supervision Social research officer, government Method: Radiographer, therapeutic: Radio broadcast assistant, Grab bars Vision Baseline Vision/History: 4 Cataracts;1 Wears glasses Patient Visual Report: No change from baseline Vision Assessment?: No apparent visual deficits Perception  Perception: Within Functional Limits Praxis Praxis: Intact Cognition Overall Cognitive Status: Within Functional Limits for tasks assessed Arousal/Alertness: Awake/alert Orientation Level: Oriented X4 Year: 2022 Month: November Day of Week: Correct Memory: Appears intact Immediate Memory Recall: Sock;Blue;Bed Memory Recall Sock: Without Cue Memory Recall Blue: Without Cue Memory Recall Bed: Without Cue Awareness: Appears intact Problem Solving: Appears intact Safety/Judgment: Appears intact Comments: good adherance to WB precautions Sensation Sensation Light Touch: Appears Intact Hot/Cold: Appears Intact Stereognosis: Appears Intact Coordination Gross Motor Movements are Fluid and Coordinated: Yes Fine Motor Movements are Fluid and Coordinated: Yes Finger Nose Finger Test: [facility] Motor  Motor Motor: Within Functional Limits Motor - Skilled Clinical Observations: generalized weakness/deconditioning, altered balance strategies due to LLE NWB precautions Mobility  Bed Mobility Bed Mobility: Rolling Right;Rolling Left;Supine to Sit;Sit to Supine Rolling Right: Independent Rolling Left: Independent Supine to Sit: Independent Sit to Supine: Independent Transfers Sit to Stand: Independent with assistive device Stand to Sit: Independent with assistive device  Trunk/Postural Assessment  Cervical Assessment Cervical Assessment: Exceptions to [facility] (forward head) Thoracic Assessment Thoracic Assessment: Exceptions to [facility] (rounded shoulders) Lumbar Assessment Lumbar Assessment: Exceptions to [facility] (posterior  pelvic tilt) Postural Control Postural Control: Within Functional Limits  Balance Balance Balance Assessed: Yes Static Sitting Balance Static Sitting - Balance Support: Feet supported;No upper extremity supported Static Sitting - Level of Assistance: 7: Independent Dynamic Sitting Balance Dynamic Sitting - Balance Support: Feet supported;No upper extremity supported Dynamic Sitting - Level of Assistance: 6: Modified independent (Device/Increase time) Static Standing Balance Static Standing - Balance Support: Bilateral upper extremity supported (RW) Static Standing - Level of Assistance: 6: Modified independent (Device/Increase time) Dynamic Standing  Balance Dynamic Standing - Balance Support: Bilateral upper extremity supported (RW) Dynamic Standing - Level of Assistance: 6: Modified independent (Device/Increase time) Dynamic Standing - Comments: with transfers Extremity/Trunk Assessment RUE Assessment RUE Assessment: Within Functional Limits LUE Assessment LUE Assessment: Within Functional Limits   Makena Mcgrady E Mardelle Pandolfi 01/18/2021, 4:10 PM

## 2021-01-19 DIAGNOSIS — E876 Hypokalemia: Secondary | ICD-10-CM

## 2021-01-19 DIAGNOSIS — I10 Essential (primary) hypertension: Secondary | ICD-10-CM

## 2021-01-19 DIAGNOSIS — D62 Acute posthemorrhagic anemia: Secondary | ICD-10-CM | POA: Diagnosis not present

## 2021-01-19 DIAGNOSIS — G479 Sleep disorder, unspecified: Secondary | ICD-10-CM

## 2021-01-19 DIAGNOSIS — T1490XA Injury, unspecified, initial encounter: Secondary | ICD-10-CM | POA: Diagnosis not present

## 2021-01-19 MED ORDER — MELATONIN 3 MG PO TABS
1.5000 mg | ORAL_TABLET | Freq: Every day | ORAL | Status: DC
  Administered 2021-01-19: 1.5 mg via ORAL
  Filled 2021-01-19: qty 1

## 2021-01-19 NOTE — Plan of Care (Signed)
  Problem: Consults Goal: RH GENERAL PATIENT EDUCATION Description: See Patient Education module for education specifics. Outcome: Progressing Goal: Skin Care Protocol Initiated - if Braden Score 18 or less Description: If consults are not indicated, leave blank or document N/A Outcome: Progressing   Problem: RH BOWEL ELIMINATION Goal: RH STG MANAGE BOWEL WITH ASSISTANCE Description: STG Manage Bowel with Supervision Assistance. Outcome: Progressing Goal: RH STG MANAGE BOWEL W/MEDICATION W/ASSISTANCE Description: STG Manage Bowel with Medication with Supervision Assistance. Outcome: Progressing   Problem: RH SKIN INTEGRITY Goal: RH STG MAINTAIN SKIN INTEGRITY WITH ASSISTANCE Description: STG Maintain Skin Integrity With Supervision Assistance. Outcome: Progressing Goal: RH STG ABLE TO PERFORM INCISION/WOUND CARE W/ASSISTANCE Description: STG Able To Perform Incision/Wound Care With Supervision Assistance. Outcome: Progressing   Problem: RH SAFETY Goal: RH STG ADHERE TO SAFETY PRECAUTIONS W/ASSISTANCE/DEVICE Description: STG Adhere to Safety Precautions With cues and reminders. Outcome: Progressing Goal: RH STG DECREASED RISK OF FALL WITH ASSISTANCE Description: STG Decreased Risk of Fall With Supervision Assistance. Outcome: Progressing   Problem: RH PAIN MANAGEMENT Goal: RH STG PAIN MANAGED AT OR BELOW PT'S PAIN GOAL Description: < 3 on a 0-10 pain scale. Outcome: Progressing   Problem: RH KNOWLEDGE DEFICIT GENERAL Goal: RH STG INCREASE KNOWLEDGE OF SELF CARE AFTER HOSPITALIZATION Description: Patient will demonstrate knowledge of medication/pain management, skin/wound care, safety precautions, and weight bearing precautions with educational materials and handouts provided by staff independently at discharge. Outcome: Progressing
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
## 2021-01-19 NOTE — Progress Notes (Signed)
Occupational Therapy Session Note  Patient Details  Name: Hannah Tucker MRN: [id number] Date of Birth: 1940-05-10  Today's Date: 01/19/2021 OT Individual Time: 9794-8016 OT Individual Time Calculation (min): 39 min    Short Term Goals: Week 1:  OT Short Term Goal 1 (Week 1): STG = LTG due to ELOS  Skilled Therapeutic Interventions/Progress Updates:  Skilled OT intervention completed with focus on d/c planning, discussion of POC and rehab goals. Questions about d/c home addressed throughout session. Pt received supine in bed, agreeable to session. When asked about pain level, pt reported being confused about how to accurately rate pain. Provided education and pain rating scale visual on computer with severity of pain, with pt then reporting 2/10. Pt reported not having pain meds today, to attempt working without them. Education provided about positioning during sleeping in order to promote productive rest, with pt attempting several side lying positions with pillows for support with supervision. Bed mobility supine > [name] with bed flat at mod I level this session. Sit > stand, then stand pivot using RW to recliner with mod I. While seated in recliner, pt requested to review form and positioning for BUE strengthening exercises with green theraband. Cues required for accuracy, and clarification needed for some of the wording on the worksheet, which therapist re-wrote for better understanding. Pt left seated in recliner, with chair alarm on, and all needs in reach at end of session.   Therapy Documentation Precautions:  Precautions Precautions: Fall Precaution Comments: per ortho: "WBAT R leg for transfers only. Do not want to over work her R leg give recent surgery and NWB on L leg" Required Braces or Orthoses: Splint/Cast Splint/Cast: Cast on LLE Restrictions Weight Bearing Restrictions: Yes RLE Weight Bearing: Weight bearing as tolerated LLE Weight Bearing: Non weight bearing Other  Position/Activity Restrictions: RLE WBAT for transfers only  Pain: 2/10 pain in LLE, denied need of intervention   Therapy/Group: Individual Therapy  Cyra Spader E Ursula Dermody 01/19/2021, 7:30 AM

## 2021-01-20 DIAGNOSIS — D62 Acute posthemorrhagic anemia: Secondary | ICD-10-CM | POA: Diagnosis not present

## 2021-01-20 DIAGNOSIS — K5901 Slow transit constipation: Secondary | ICD-10-CM

## 2021-01-20 DIAGNOSIS — T1490XA Injury, unspecified, initial encounter: Secondary | ICD-10-CM | POA: Diagnosis not present

## 2021-01-20 DIAGNOSIS — I10 Essential (primary) hypertension: Secondary | ICD-10-CM | POA: Diagnosis not present

## 2021-01-20 NOTE — Progress Notes (Signed)
Patient inquired about receiving wheelchair and bedside commode for home. Patient stated that nothing was delivered to her home. Charge nurse notified. MD notified. Left message for SW. Cletis Media, LPN
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
## 2021-01-21 NOTE — Progress Notes (Signed)
Inpatient Rehabilitation Care Coordinator Discharge Note   Patient Details  Name: Hannah Tucker MRN: [id number] Date of Birth: Aug 15, 1940   Discharge location: Home  Length of Stay:    Discharge activity level: Sup  Home/community participation: daughter  Patient response TK:PTWSFK Literacy - How often do you need to have someone help you when you read instructions, pamphlets, or other written material from your doctor or pharmacy?: Never  Patient response CL:EXNTZG Isolation - How often do you feel lonely or isolated from those around you?: Patient unable to respond  Services provided included: SW, Pharmacy, TR, CM, RN, SLP, OT, PT, RD, MD  Financial Services:  Financial Services Utilized: Medicare    Choices offered to/list presented to: pt  Follow-up services arranged:  Outpatient    Outpatient Servicies: [facility] [place]      Patient response to transportation need: Is the patient able to respond to transportation needs?: Yes In the past 12 months, has lack of transportation kept you from medical appointments or from getting medications?: No In the past 12 months, has lack of transportation kept you from meetings, work, or from getting things needed for daily living?: No    Comments (or additional information):  Patient/Family verbalized understanding of follow-up arrangements:  Yes  Individual responsible for coordination of the follow-up plan: pt  Confirmed correct DME delivered: Andria Rhein 01/21/2021    Andria Rhein

## 2021-01-22 ENCOUNTER — Telehealth: Payer: Self-pay

## 2021-01-22 NOTE — Telephone Encounter (Signed)
That's fine. Just needs to limit tylenol to less than 2500-3000mg  total per day.

## 2021-01-22 NOTE — Telephone Encounter (Signed)
Patient called stating her Oxycodone for severe pain take every 6 hours and for moderate take Tylenol every 6 hours. Patient she had an episode of moderate pain took a Tylenol then 4 hours later severe pain and took an Oxycodone then 3-4 hours after that she had moderate pain and wanted to take a Tylenol. Is this okay? Can you please address this in Dr. Richmond Campbell absence? Send response to POOL please.Thank you

## 2021-01-23 NOTE — Telephone Encounter (Signed)
Spoke with patient and informed her that she can alternate the Oxycodone and Tylenol and not to take more than 2500-3000 mg of Tylenol a day. Verbalized understanding.
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
## 2021-02-01 ENCOUNTER — Other Ambulatory Visit (HOSPITAL_COMMUNITY): Payer: Self-pay

## 2021-02-01 ENCOUNTER — Telehealth (HOSPITAL_COMMUNITY): Payer: Self-pay

## 2021-02-01 NOTE — Telephone Encounter (Signed)
Pharmacy Transitions of Care Follow-up Telephone Call  Date of discharge: 01/20/21  Discharge Diagnosis:ankle fracture  How have you been since you were released from the hospital? Slowly improving, getting PT set up   Medication changes made at discharge:  - START: Xarelto, Oxycodone  - STOPPED: Celebrex, Diclofenac gel  - CHANGED:   Medication changes verified by the patient? Yes   Medication Accessibility:  Home Pharmacy: Mail Order   Was the patient provided with refills on discharged medications? NO   Have all prescriptions been transferred from [facility] to home pharmacy? No   Is the patient able to afford medications? Yes Notable copays:  Eligible patient assistance:     Medication Review:  RIVAROXABAN (XARELTO)  Rivaroxaban 10 mg QD x 1 month - Discussed importance of taking medication with food and around the same time everyday  - Reviewed potential DDIs with patient  - Advised patient of medications to avoid (NSAIDs, ASA)  - Educated that Tylenol (acetaminophen) will be the preferred analgesic to prevent risk of bleeding  - Emphasized importance of monitoring for signs and symptoms of bleeding (abnormal bruising, prolonged bleeding, nose bleeds, bleeding from gums, discolored urine, black tarry stools)  - Advised patient to alert all providers of anticoagulation therapy prior to starting a new medication or having a procedure    Follow-up Appointments:  PCP Hospital f/u appt confirmed?  Appts scheduled and PT being set up.    If their condition worsens, is the pt aware to call PCP or go to the Emergency Dept.? Yes  Final Patient Assessment: Hannah Tucker is slowly improving. We discussed the importance of avoiding OTC NSAIDS. She does not report any bleeding/bruising/etc. We discussed the use of Oxycodone for her pain uncontrolled by Tylenol, and she takes an Oxycodone at bedtime to help her sleep. She sometimes has "a weird feeling" of objects passing her while  sitting at the table similar to a dizzy feeling. We disucssed possible causes, and she will let her MD know. She has been in contact with her PCP and has follow up appts made. PT is being scheduled.

## 2021-03-12 ENCOUNTER — Encounter: Payer: Medicare Other | Admitting: Physical Medicine and Rehabilitation

## 2021-06-25 ENCOUNTER — Ambulatory Visit: Payer: Medicare Other | Admitting: Physical Therapy

## 2021-06-27 ENCOUNTER — Ambulatory Visit: Payer: Medicare Other | Attending: Internal Medicine | Admitting: Physical Therapy

## 2021-06-27 ENCOUNTER — Encounter: Payer: Self-pay | Admitting: Physical Therapy

## 2021-06-27 DIAGNOSIS — M25572 Pain in left ankle and joints of left foot: Secondary | ICD-10-CM | POA: Diagnosis present

## 2021-06-27 DIAGNOSIS — M25672 Stiffness of left ankle, not elsewhere classified: Secondary | ICD-10-CM | POA: Insufficient documentation
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
## 2021-06-28 ENCOUNTER — Other Ambulatory Visit: Payer: Self-pay | Admitting: Internal Medicine

## 2021-06-28 DIAGNOSIS — Z1231 Encounter for screening mammogram for malignant neoplasm of breast: Secondary | ICD-10-CM

## 2021-07-03 ENCOUNTER — Ambulatory Visit: Payer: Medicare Other | Attending: Internal Medicine

## 2021-07-03 DIAGNOSIS — R52 Pain, unspecified: Secondary | ICD-10-CM | POA: Insufficient documentation

## 2021-07-03 DIAGNOSIS — R262 Difficulty in walking, not elsewhere classified: Secondary | ICD-10-CM | POA: Insufficient documentation

## 2021-07-03 DIAGNOSIS — M25572 Pain in left ankle and joints of left foot: Secondary | ICD-10-CM | POA: Insufficient documentation

## 2021-07-03 DIAGNOSIS — M25672 Stiffness of left ankle, not elsewhere classified: Secondary | ICD-10-CM | POA: Insufficient documentation

## 2021-07-03 DIAGNOSIS — R269 Unspecified abnormalities of gait and mobility: Secondary | ICD-10-CM | POA: Insufficient documentation

## 2021-07-03 DIAGNOSIS — M6281 Muscle weakness (generalized): Secondary | ICD-10-CM | POA: Diagnosis present
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
## 2021-07-10 ENCOUNTER — Ambulatory Visit: Payer: Medicare Other

## 2021-07-10 DIAGNOSIS — R52 Pain, unspecified: Secondary | ICD-10-CM

## 2021-07-10 DIAGNOSIS — M25672 Stiffness of left ankle, not elsewhere classified: Secondary | ICD-10-CM

## 2021-07-10 DIAGNOSIS — M25572 Pain in left ankle and joints of left foot: Secondary | ICD-10-CM
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
## 2021-07-17 ENCOUNTER — Ambulatory Visit: Payer: Medicare Other

## 2021-07-17 DIAGNOSIS — M25672 Stiffness of left ankle, not elsewhere classified: Secondary | ICD-10-CM

## 2021-07-17 DIAGNOSIS — R52 Pain, unspecified: Secondary | ICD-10-CM

## 2021-07-17 DIAGNOSIS — M25572 Pain in left ankle and joints of left foot: Secondary | ICD-10-CM
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
## 2021-07-22 ENCOUNTER — Ambulatory Visit: Payer: Medicare Other

## 2021-07-22 DIAGNOSIS — R269 Unspecified abnormalities of gait and mobility: Secondary | ICD-10-CM

## 2021-07-22 DIAGNOSIS — R262 Difficulty in walking, not elsewhere classified: Secondary | ICD-10-CM

## 2021-07-22 DIAGNOSIS — M25572 Pain in left ankle and joints of left foot: Secondary | ICD-10-CM

## 2021-07-22 DIAGNOSIS — M25672 Stiffness of left ankle, not elsewhere classified: Secondary | ICD-10-CM

## 2021-07-22 DIAGNOSIS — M6281 Muscle weakness (generalized): Secondary | ICD-10-CM
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
## 2021-07-24 ENCOUNTER — Ambulatory Visit: Payer: Medicare Other | Admitting: Physical Therapy

## 2021-07-24 ENCOUNTER — Encounter: Payer: Self-pay | Admitting: Physical Therapy

## 2021-07-24 DIAGNOSIS — M25672 Stiffness of left ankle, not elsewhere classified: Secondary | ICD-10-CM

## 2021-07-24 DIAGNOSIS — M25572 Pain in left ankle and joints of left foot: Secondary | ICD-10-CM

## 2021-07-24 DIAGNOSIS — R269 Unspecified abnormalities of gait and mobility: Secondary | ICD-10-CM

## 2021-07-24 DIAGNOSIS — R262 Difficulty in walking, not elsewhere classified: Secondary | ICD-10-CM

## 2021-07-24 DIAGNOSIS — M6281 Muscle weakness (generalized): Secondary | ICD-10-CM
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
## 2021-07-31 ENCOUNTER — Ambulatory Visit
Admission: RE | Admit: 2021-07-31 | Discharge: 2021-07-31 | Disposition: A | Discharge status: Home / Self Care | Payer: Medicare Other | Source: Ambulatory Visit | Attending: Internal Medicine | Admitting: Internal Medicine

## 2021-07-31 DIAGNOSIS — Z1231 Encounter for screening mammogram for malignant neoplasm of breast: Secondary | ICD-10-CM | POA: Diagnosis present

## 2021-08-01 ENCOUNTER — Ambulatory Visit: Payer: Medicare Other | Attending: Internal Medicine

## 2021-08-01 DIAGNOSIS — M6281 Muscle weakness (generalized): Secondary | ICD-10-CM | POA: Diagnosis present

## 2021-08-01 DIAGNOSIS — R269 Unspecified abnormalities of gait and mobility: Secondary | ICD-10-CM | POA: Insufficient documentation

## 2021-08-01 DIAGNOSIS — R52 Pain, unspecified: Secondary | ICD-10-CM | POA: Diagnosis present

## 2021-08-01 DIAGNOSIS — M25572 Pain in left ankle and joints of left foot: Secondary | ICD-10-CM | POA: Diagnosis present

## 2021-08-01 DIAGNOSIS — M25672 Stiffness of left ankle, not elsewhere classified: Secondary | ICD-10-CM | POA: Insufficient documentation

## 2021-08-01 DIAGNOSIS — R262 Difficulty in walking, not elsewhere classified: Secondary | ICD-10-CM | POA: Diagnosis present
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
## 2021-08-05 ENCOUNTER — Encounter: Payer: Self-pay | Admitting: Physical Therapy

## 2021-08-05 ENCOUNTER — Ambulatory Visit: Payer: Medicare Other | Admitting: Physical Therapy

## 2021-08-05 DIAGNOSIS — M25672 Stiffness of left ankle, not elsewhere classified: Secondary | ICD-10-CM

## 2021-08-05 DIAGNOSIS — R269 Unspecified abnormalities of gait and mobility: Secondary | ICD-10-CM

## 2021-08-05 DIAGNOSIS — M25572 Pain in left ankle and joints of left foot: Secondary | ICD-10-CM

## 2021-08-05 DIAGNOSIS — R262 Difficulty in walking, not elsewhere classified: Secondary | ICD-10-CM
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
## 2021-08-07 ENCOUNTER — Ambulatory Visit: Payer: Medicare Other | Admitting: Physical Therapy

## 2021-08-07 ENCOUNTER — Encounter: Payer: Self-pay | Admitting: Physical Therapy

## 2021-08-07 DIAGNOSIS — M25672 Stiffness of left ankle, not elsewhere classified: Secondary | ICD-10-CM | POA: Diagnosis not present

## 2021-08-07 DIAGNOSIS — M25572 Pain in left ankle and joints of left foot: Secondary | ICD-10-CM

## 2021-08-07 DIAGNOSIS — R262 Difficulty in walking, not elsewhere classified: Secondary | ICD-10-CM
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
## 2021-08-12 ENCOUNTER — Ambulatory Visit: Payer: Medicare Other | Admitting: Physical Therapy

## 2021-08-12 ENCOUNTER — Encounter: Payer: Self-pay | Admitting: Physical Therapy

## 2021-08-12 DIAGNOSIS — M25672 Stiffness of left ankle, not elsewhere classified: Secondary | ICD-10-CM | POA: Diagnosis not present

## 2021-08-12 DIAGNOSIS — M6281 Muscle weakness (generalized): Secondary | ICD-10-CM

## 2021-08-12 DIAGNOSIS — R262 Difficulty in walking, not elsewhere classified: Secondary | ICD-10-CM

## 2021-08-12 DIAGNOSIS — M25572 Pain in left ankle and joints of left foot: Secondary | ICD-10-CM

## 2021-08-12 DIAGNOSIS — R269 Unspecified abnormalities of gait and mobility: Secondary | ICD-10-CM
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
## 2021-08-14 ENCOUNTER — Ambulatory Visit: Payer: Medicare Other | Admitting: Physical Therapy

## 2021-08-14 ENCOUNTER — Encounter: Payer: Self-pay | Admitting: Physical Therapy

## 2021-08-14 DIAGNOSIS — R52 Pain, unspecified: Secondary | ICD-10-CM

## 2021-08-14 DIAGNOSIS — R262 Difficulty in walking, not elsewhere classified: Secondary | ICD-10-CM

## 2021-08-14 DIAGNOSIS — M25672 Stiffness of left ankle, not elsewhere classified: Secondary | ICD-10-CM

## 2021-08-14 DIAGNOSIS — R269 Unspecified abnormalities of gait and mobility: Secondary | ICD-10-CM

## 2021-08-14 DIAGNOSIS — M25572 Pain in left ankle and joints of left foot: Secondary | ICD-10-CM

## 2021-08-14 DIAGNOSIS — M6281 Muscle weakness (generalized): Secondary | ICD-10-CM
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
## 2021-08-19 ENCOUNTER — Ambulatory Visit: Payer: Medicare Other | Admitting: Physical Therapy

## 2021-08-19 ENCOUNTER — Encounter: Payer: Self-pay | Admitting: Physical Therapy

## 2021-08-19 DIAGNOSIS — M25672 Stiffness of left ankle, not elsewhere classified: Secondary | ICD-10-CM | POA: Diagnosis not present

## 2021-08-19 DIAGNOSIS — R269 Unspecified abnormalities of gait and mobility: Secondary | ICD-10-CM

## 2021-08-19 DIAGNOSIS — M25572 Pain in left ankle and joints of left foot: Secondary | ICD-10-CM

## 2021-08-19 DIAGNOSIS — R262 Difficulty in walking, not elsewhere classified: Secondary | ICD-10-CM
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
## 2021-08-21 ENCOUNTER — Ambulatory Visit: Payer: Medicare Other

## 2021-08-26 ENCOUNTER — Ambulatory Visit: Payer: Medicare Other | Admitting: Physical Therapy

## 2021-08-26 ENCOUNTER — Encounter: Payer: Self-pay | Admitting: Physical Therapy

## 2021-08-26 DIAGNOSIS — M25672 Stiffness of left ankle, not elsewhere classified: Secondary | ICD-10-CM | POA: Diagnosis not present

## 2021-08-26 DIAGNOSIS — M25572 Pain in left ankle and joints of left foot: Secondary | ICD-10-CM

## 2021-08-26 DIAGNOSIS — R262 Difficulty in walking, not elsewhere classified: Secondary | ICD-10-CM

## 2021-08-26 DIAGNOSIS — R269 Unspecified abnormalities of gait and mobility: Secondary | ICD-10-CM

## 2021-08-28 ENCOUNTER — Ambulatory Visit: Payer: Medicare Other

## 2021-08-28 DIAGNOSIS — M25672 Stiffness of left ankle, not elsewhere classified: Secondary | ICD-10-CM

## 2021-08-28 DIAGNOSIS — R269 Unspecified abnormalities of gait and mobility: Secondary | ICD-10-CM

## 2021-08-28 DIAGNOSIS — R262 Difficulty in walking, not elsewhere classified: Secondary | ICD-10-CM

## 2021-08-28 DIAGNOSIS — M25572 Pain in left ankle and joints of left foot: Secondary | ICD-10-CM
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
## 2021-09-02 ENCOUNTER — Ambulatory Visit: Payer: Medicare Other | Attending: Internal Medicine

## 2021-09-02 DIAGNOSIS — M6281 Muscle weakness (generalized): Secondary | ICD-10-CM

## 2021-09-02 DIAGNOSIS — R262 Difficulty in walking, not elsewhere classified: Secondary | ICD-10-CM | POA: Diagnosis present

## 2021-09-02 DIAGNOSIS — M25572 Pain in left ankle and joints of left foot: Secondary | ICD-10-CM

## 2021-09-02 DIAGNOSIS — M25672 Stiffness of left ankle, not elsewhere classified: Secondary | ICD-10-CM

## 2021-09-02 DIAGNOSIS — R52 Pain, unspecified: Secondary | ICD-10-CM

## 2021-09-02 DIAGNOSIS — R269 Unspecified abnormalities of gait and mobility: Secondary | ICD-10-CM
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
## 2021-09-04 ENCOUNTER — Ambulatory Visit: Payer: Medicare Other | Admitting: Physical Therapy

## 2021-09-04 ENCOUNTER — Encounter: Payer: Self-pay | Admitting: Physical Therapy

## 2021-09-04 DIAGNOSIS — M25572 Pain in left ankle and joints of left foot: Secondary | ICD-10-CM | POA: Diagnosis not present

## 2021-09-04 DIAGNOSIS — R52 Pain, unspecified: Secondary | ICD-10-CM

## 2021-09-04 DIAGNOSIS — M25672 Stiffness of left ankle, not elsewhere classified: Secondary | ICD-10-CM

## 2021-09-04 DIAGNOSIS — R262 Difficulty in walking, not elsewhere classified: Secondary | ICD-10-CM

## 2021-09-04 DIAGNOSIS — M6281 Muscle weakness (generalized): Secondary | ICD-10-CM

## 2021-09-04 DIAGNOSIS — R269 Unspecified abnormalities of gait and mobility: Secondary | ICD-10-CM
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
## 2021-09-09 ENCOUNTER — Ambulatory Visit: Payer: Medicare Other

## 2021-09-09 DIAGNOSIS — M25572 Pain in left ankle and joints of left foot: Secondary | ICD-10-CM

## 2021-09-09 DIAGNOSIS — R269 Unspecified abnormalities of gait and mobility: Secondary | ICD-10-CM

## 2021-09-09 DIAGNOSIS — R262 Difficulty in walking, not elsewhere classified: Secondary | ICD-10-CM

## 2021-09-09 DIAGNOSIS — M25672 Stiffness of left ankle, not elsewhere classified: Secondary | ICD-10-CM

## 2021-09-11 ENCOUNTER — Ambulatory Visit: Payer: Medicare Other

## 2021-09-11 DIAGNOSIS — M25672 Stiffness of left ankle, not elsewhere classified: Secondary | ICD-10-CM

## 2021-09-11 DIAGNOSIS — R269 Unspecified abnormalities of gait and mobility: Secondary | ICD-10-CM

## 2021-09-11 DIAGNOSIS — R262 Difficulty in walking, not elsewhere classified: Secondary | ICD-10-CM

## 2021-09-11 DIAGNOSIS — M25572 Pain in left ankle and joints of left foot: Secondary | ICD-10-CM

## 2021-09-11 DIAGNOSIS — M6281 Muscle weakness (generalized): Secondary | ICD-10-CM

## 2021-09-11 DIAGNOSIS — R52 Pain, unspecified: Secondary | ICD-10-CM
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
## 2021-09-16 ENCOUNTER — Ambulatory Visit: Payer: Medicare Other | Admitting: Physical Therapy

## 2021-09-16 ENCOUNTER — Other Ambulatory Visit: Payer: Self-pay | Admitting: Orthopedic Surgery

## 2021-09-16 ENCOUNTER — Other Ambulatory Visit (HOSPITAL_COMMUNITY): Payer: Self-pay | Admitting: Orthopedic Surgery

## 2021-09-16 DIAGNOSIS — S82872H Displaced pilon fracture of left tibia, subsequent encounter for open fracture type I or II with delayed healing: Secondary | ICD-10-CM

## 2021-09-18 ENCOUNTER — Ambulatory Visit: Payer: Medicare Other | Admitting: Physical Therapy

## 2021-09-24 ENCOUNTER — Ambulatory Visit
Admission: RE | Admit: 2021-09-24 | Discharge: 2021-09-24 | Disposition: A | Discharge status: Home / Self Care | Payer: Medicare Other | Source: Ambulatory Visit | Attending: Orthopedic Surgery | Admitting: Orthopedic Surgery

## 2021-09-24 DIAGNOSIS — S82872H Displaced pilon fracture of left tibia, subsequent encounter for open fracture type I or II with delayed healing: Secondary | ICD-10-CM | POA: Insufficient documentation

## 2021-09-25 ENCOUNTER — Encounter: Payer: Medicare Other | Admitting: Physical Therapy

## 2021-09-30 ENCOUNTER — Encounter: Payer: Medicare Other | Admitting: Physical Therapy

## 2021-10-02 ENCOUNTER — Telehealth: Payer: Self-pay | Admitting: Physical Therapy

## 2021-10-02 ENCOUNTER — Ambulatory Visit: Payer: Medicare Other | Admitting: Physical Therapy
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
## 2021-10-07 ENCOUNTER — Ambulatory Visit: Payer: Medicare Other | Admitting: Physical Therapy

## 2021-10-09 ENCOUNTER — Encounter: Payer: Medicare Other | Admitting: Physical Therapy

## 2021-10-14 ENCOUNTER — Encounter: Payer: Medicare Other | Admitting: Physical Therapy

## 2021-10-16 ENCOUNTER — Encounter: Payer: Medicare Other | Admitting: Physical Therapy

## 2021-10-21 ENCOUNTER — Encounter: Payer: Medicare Other | Admitting: Physical Therapy

## 2021-10-23 ENCOUNTER — Encounter: Payer: Medicare Other | Admitting: Physical Therapy

## 2021-10-28 ENCOUNTER — Ambulatory Visit: Payer: Medicare Other | Admitting: Physical Therapy

## 2021-10-30 ENCOUNTER — Encounter: Payer: Medicare Other | Admitting: Physical Therapy

## 2021-11-06 ENCOUNTER — Encounter: Payer: Medicare Other | Admitting: Physical Therapy

## 2021-11-11 ENCOUNTER — Encounter: Payer: Medicare Other | Admitting: Physical Therapy

## 2021-11-13 ENCOUNTER — Encounter: Payer: Medicare Other | Admitting: Physical Therapy

## 2021-11-18 ENCOUNTER — Encounter: Payer: Medicare Other | Admitting: Physical Therapy

## 2021-11-20 ENCOUNTER — Encounter: Payer: Medicare Other | Admitting: Physical Therapy

## 2021-11-25 ENCOUNTER — Encounter: Payer: Medicare Other | Admitting: Physical Therapy

## 2021-11-26 ENCOUNTER — Other Ambulatory Visit: Payer: Self-pay | Admitting: Orthopaedic Surgery

## 2021-11-27 ENCOUNTER — Encounter: Payer: Medicare Other | Admitting: Physical Therapy

## 2021-12-02 ENCOUNTER — Encounter: Payer: Medicare Other | Admitting: Physical Therapy

## 2021-12-04 ENCOUNTER — Other Ambulatory Visit: Payer: Self-pay

## 2021-12-04 ENCOUNTER — Encounter (HOSPITAL_COMMUNITY): Payer: Self-pay | Admitting: Orthopaedic Surgery

## 2021-12-04 ENCOUNTER — Encounter: Payer: Medicare Other | Admitting: Physical Therapy

## 2021-12-04 NOTE — Progress Notes (Signed)
S.D.W- Instructions   Your procedure is scheduled on Thurs., Oct. 5, 2023 from 12:05PM-2:35PM.  Report to [facility] Main Entrance "A" at 9:30 A.M., then check in with the Admitting office.  Call this number if you have problems the morning of surgery:  [phone]   Remember:  Do not eat after midnight on Oct. 4th  You may drink clear liquids until 3 hours (9:00AM) prior to surgery time the morning of your surgery.   Clear liquids allowed are: Water, Non-Citrus Juices (without pulp), Carbonated Beverages, Clear Tea, Black Coffee ONLY (NO MILK, CREAM OR POWDERED CREAMER of any kind), and Gatorade    Take these medicines the morning of surgery with A SIP OF WATER: Levothyroxine (SYNTHROID, LEVOTHROID) Polyethyl Glycol-Propyl Glycol (SYSTANE OP)  If Needed: acetaminophen (TYLENOL)  As of today, STOP taking any Aspirin (unless otherwise instructed by your surgeon) Aleve, Naproxen, Ibuprofen, Motrin, Advil, Goody's, BC's, all herbal medications, fish oil, and all vitamins.          Do not wear jewelry or makeup. Do not wear lotions, powders, perfumes or deodorant. Do not shave 48 hours prior to surgery.   Do not bring valuables to the hospital. Do not wear nail polish, gel polish, artificial nails, or any other type of covering on natural nails (fingers and toes) If you have artificial nails or gel coating that need to be removed by a nail salon, please have this removed prior to surgery. Artificial nails or gel coating may interfere with anesthesia's ability to adequately monitor your vital signs.  [facility] is not responsible for any belongings or valuables.    Do NOT Smoke (Tobacco/Vaping)  24 hours prior to your procedure  If you use a CPAP at night, you may bring your mask for your overnight stay.   Contacts, glasses, hearing aids, dentures or partials may not be worn into surgery, please bring cases for these belongings   For patients admitted to the hospital, discharge time  will be determined by your treatment team.   Patients discharged the day of surgery will not be allowed to drive home, and someone needs to stay with them for 24 hours.  Special instructions:    Oral Hygiene is also important to reduce your risk of infection.  Remember - BRUSH YOUR TEETH THE MORNING OF SURGERY WITH YOUR REGULAR TOOTHPASTE  [place]- Preparing For Surgery  Before surgery, you can play an important role. Because skin is not sterile, your skin needs to be as free of germs as possible. You can reduce the number of germs on your skin by washing with Antibacterial Soap before surgery.     Please follow these instructions carefully.     Shower the NIGHT BEFORE SURGERY and the MORNING OF SURGERY with Antibacterial Soap.   Pat yourself dry with a CLEAN TOWEL.  Wear CLEAN PAJAMAS to bed the night before surgery  Place CLEAN SHEETS on your bed the night before your surgery  DO NOT SLEEP WITH PETS.  Day of Surgery:  Take a shower with Antibacterial soap. Wear Clean/Comfortable clothing the morning of surgery Do not apply any deodorants/lotions.   Remember to brush your teeth WITH YOUR REGULAR TOOTHPASTE.   If you test positive for Covid, or been in contact with anyone that has tested positive in the last 10 days, please notify your surgeon.  SURGICAL WAITING ROOM VISITATION Patients having surgery or a procedure may have no more than 2 support people in the waiting area - these visitors  may rotate.   Children under the age of 56 must have an adult with them who is not the patient. If the patient needs to stay at the hospital during part of their recovery, the visitor guidelines for inpatient rooms apply. Pre-op nurse will coordinate an appropriate time for 1 support person to accompany patient in pre-op.  This support person may not rotate.   Please refer to the [facility] website for the visitor guidelines for Inpatients (after your surgery is over and you are in a  regular room).

## 2021-12-04 NOTE — Progress Notes (Signed)
[facility]  Cardiologist - Denies  EP- Denies  Endocrine- Denies  Pulm- Denies  Chest x-ray - Denies  EKG - 01/03/21 (E)  Stress Test - Denies  ECHO - Denies  Cardiac Cath - Denies  AICD-na PM-na LOOP-na  Nerve Stimulator- Denies  Dialysis- Denies  Sleep Study - Yes- Positive CPAP - Yes  LABS- 12/05/21: CBC, BMP, PCR  ASA- Denies  ERAS- Yes- clears until 0900  HA1C- Denies  Anesthesia- No  Pt denies having chest pain, sob, or fever during the pre-op phone call. All instructions explained to the pt, with a verbal understanding of the material. Pt also instructed to wear a mask and social distance if she goes out. The opportunity to ask questions was provided.

## 2021-12-05 ENCOUNTER — Ambulatory Visit (HOSPITAL_COMMUNITY): Payer: Medicare Other

## 2021-12-05 ENCOUNTER — Ambulatory Visit (HOSPITAL_COMMUNITY)
Admission: RE | Admit: 2021-12-05 | Discharge: 2021-12-06 | Disposition: A | Discharge status: Home / Self Care | Payer: Medicare Other | Attending: Orthopaedic Surgery | Admitting: Orthopaedic Surgery

## 2021-12-05 ENCOUNTER — Encounter (HOSPITAL_COMMUNITY)
Admission: RE | Disposition: A | Discharge status: Home / Self Care | Payer: Self-pay | Source: Home / Self Care | Attending: Orthopaedic Surgery

## 2021-12-05 ENCOUNTER — Other Ambulatory Visit: Payer: Self-pay

## 2021-12-05 ENCOUNTER — Encounter (HOSPITAL_COMMUNITY): Payer: Self-pay | Admitting: Orthopaedic Surgery

## 2021-12-05 ENCOUNTER — Ambulatory Visit (HOSPITAL_COMMUNITY): Payer: Medicare Other | Admitting: Anesthesiology

## 2021-12-05 ENCOUNTER — Ambulatory Visit (HOSPITAL_BASED_OUTPATIENT_CLINIC_OR_DEPARTMENT_OTHER): Payer: Medicare Other | Admitting: Anesthesiology

## 2021-12-05 DIAGNOSIS — M19172 Post-traumatic osteoarthritis, left ankle and foot: Secondary | ICD-10-CM | POA: Diagnosis not present

## 2021-12-05 DIAGNOSIS — E039 Hypothyroidism, unspecified: Secondary | ICD-10-CM | POA: Diagnosis not present

## 2021-12-05 DIAGNOSIS — X58XXXA Exposure to other specified factors, initial encounter: Secondary | ICD-10-CM | POA: Insufficient documentation

## 2021-12-05 DIAGNOSIS — I10 Essential (primary) hypertension: Secondary | ICD-10-CM | POA: Diagnosis not present

## 2021-12-05 DIAGNOSIS — G4730 Sleep apnea, unspecified: Secondary | ICD-10-CM | POA: Diagnosis not present

## 2021-12-05 DIAGNOSIS — S8252XA Displaced fracture of medial malleolus of left tibia, initial encounter for closed fracture: Secondary | ICD-10-CM | POA: Insufficient documentation

## 2021-12-05 DIAGNOSIS — M19072 Primary osteoarthritis, left ankle and foot: Secondary | ICD-10-CM | POA: Diagnosis present

## 2021-12-05 DIAGNOSIS — S82202K Unspecified fracture of shaft of left tibia, subsequent encounter for closed fracture with nonunion: Secondary | ICD-10-CM

## 2021-12-05 DIAGNOSIS — T8484XA Pain due to internal orthopedic prosthetic devices, implants and grafts, initial encounter: Secondary | ICD-10-CM | POA: Diagnosis not present

## 2021-12-05 HISTORY — PX: HARDWARE REMOVAL: SHX979

## 2021-12-05 HISTORY — DX: Nonrheumatic mitral (valve) insufficiency: I34.0

## 2021-12-05 HISTORY — PX: ANKLE ARTHROSCOPY WITH ARTHRODESIS: SHX5579

## 2021-12-05 LAB — CBC
HCT: 44.5 % (ref 36.0–46.0)
Hemoglobin: 14.2 g/dL (ref 12.0–15.0)
MCH: 26.5 pg (ref 26.0–34.0)
MCHC: 31.9 g/dL (ref 30.0–36.0)
MCV: 83.2 fL (ref 80.0–100.0)
Platelets: 282 10*3/uL (ref 150–400)
RBC: 5.35 MIL/uL — ABNORMAL HIGH (ref 3.87–5.11)
RDW: 14.5 % (ref 11.5–15.5)
WBC: 5.6 10*3/uL (ref 4.0–10.5)
nRBC: 0 % (ref 0.0–0.2)

## 2021-12-05 LAB — BASIC METABOLIC PANEL
Anion gap: 11 (ref 5–15)
BUN: 14 mg/dL (ref 8–23)
CO2: 24 mmol/L (ref 22–32)
Calcium: 9.2 mg/dL (ref 8.9–10.3)
Chloride: 104 mmol/L (ref 98–111)
Creatinine, Ser: 0.64 mg/dL (ref 0.44–1.00)
GFR, Estimated: >60 mL/min (ref >60)
Glucose, Bld: 91 mg/dL (ref 70–99)
Potassium: 2.9 mmol/L — ABNORMAL LOW (ref 3.5–5.1)
Sodium: 139 mmol/L (ref 135–145)

## 2021-12-05 LAB — SURGICAL PCR SCREEN
MRSA, PCR: NEGATIVE
Staphylococcus aureus: NEGATIVE

## 2021-12-05 SURGERY — REMOVAL, HARDWARE
Anesthesia: Regional | Site: Ankle | Laterality: Left

## 2021-12-05 MED ORDER — HYDROCODONE-ACETAMINOPHEN 5-325 MG PO TABS
1.0000 | ORAL_TABLET | ORAL | Status: DC | PRN
  Administered 2021-12-05 – 2021-12-06 (×2): 1 via ORAL
  Filled 2021-12-05 (×2): qty 1

## 2021-12-05 MED ORDER — PROPOFOL 500 MG/50ML IV EMUL
INTRAVENOUS | Status: DC | PRN
  Administered 2021-12-05 (×2): 100 ug/kg/min via INTRAVENOUS

## 2021-12-05 MED ORDER — CEFAZOLIN SODIUM-DEXTROSE 1-4 GM/50ML-% IV SOLN
1.0000 g | Freq: Four times a day (QID) | INTRAVENOUS | Status: AC
  Administered 2021-12-05 – 2021-12-06 (×3): 1 g via INTRAVENOUS
  Filled 2021-12-05 (×3): qty 50

## 2021-12-05 MED ORDER — HYDROCHLOROTHIAZIDE 12.5 MG PO TABS
12.5000 mg | ORAL_TABLET | Freq: Every day | ORAL | Status: DC
  Administered 2021-12-05 – 2021-12-06 (×2): 12.5 mg via ORAL
  Filled 2021-12-05 (×2): qty 1

## 2021-12-05 MED ORDER — OYSTER SHELL CALCIUM/D3 500-5 MG-MCG PO TABS
1.0000 | ORAL_TABLET | Freq: Every day | ORAL | Status: DC
  Administered 2021-12-05 – 2021-12-06 (×2): 1 via ORAL
  Filled 2021-12-05 (×2): qty 1

## 2021-12-05 MED ORDER — NAPROXEN 250 MG PO TABS
250.0000 mg | ORAL_TABLET | Freq: Two times a day (BID) | ORAL | Status: DC
  Administered 2021-12-06: 250 mg via ORAL
  Filled 2021-12-05: qty 1

## 2021-12-05 MED ORDER — LACTATED RINGERS IV SOLN: INTRAVENOUS | Status: DC

## 2021-12-05 MED ORDER — METHOCARBAMOL 500 MG PO TABS
500.0000 mg | ORAL_TABLET | Freq: Four times a day (QID) | ORAL | Status: DC | PRN
  Administered 2021-12-05: 500 mg via ORAL
  Filled 2021-12-05: qty 1

## 2021-12-05 MED ORDER — DOCUSATE SODIUM 100 MG PO CAPS
100.0000 mg | ORAL_CAPSULE | Freq: Two times a day (BID) | ORAL | Status: DC
  Administered 2021-12-05 – 2021-12-06 (×2): 100 mg via ORAL
  Filled 2021-12-05 (×2): qty 1

## 2021-12-05 MED ORDER — ROPIVACAINE HCL 5 MG/ML IJ SOLN
INTRAMUSCULAR | Status: DC | PRN
  Administered 2021-12-05: 15 mL via EPIDURAL
  Administered 2021-12-05: 25 mL via EPIDURAL

## 2021-12-05 MED ORDER — FENTANYL CITRATE (PF) 100 MCG/2ML IJ SOLN
INTRAMUSCULAR | Status: AC
  Filled 2021-12-05: qty 2

## 2021-12-05 MED ORDER — SUCCINYLCHOLINE CHLORIDE 200 MG/10ML IV SOSY
PREFILLED_SYRINGE | INTRAVENOUS | Status: DC | PRN
  Administered 2021-12-05: 120 mg via INTRAVENOUS

## 2021-12-05 MED ORDER — LOSARTAN POTASSIUM 50 MG PO TABS
100.0000 mg | ORAL_TABLET | Freq: Every day | ORAL | Status: DC
  Administered 2021-12-05 – 2021-12-06 (×2): 100 mg via ORAL
  Filled 2021-12-05 (×2): qty 2

## 2021-12-05 MED ORDER — ENOXAPARIN SODIUM 40 MG/0.4ML IJ SOSY
40.0000 mg | PREFILLED_SYRINGE | INTRAMUSCULAR | Status: DC
  Administered 2021-12-06: 40 mg via SUBCUTANEOUS
  Filled 2021-12-05: qty 0.4

## 2021-12-05 MED ORDER — PROPOFOL 10 MG/ML IV BOLUS
INTRAVENOUS | Status: DC | PRN
  Administered 2021-12-05: 130 mg via INTRAVENOUS

## 2021-12-05 MED ORDER — ACETAMINOPHEN 325 MG PO TABS: 325.0000 mg | ORAL_TABLET | Freq: Four times a day (QID) | ORAL | Status: DC | PRN

## 2021-12-05 MED ORDER — SUGAMMADEX SODIUM 200 MG/2ML IV SOLN
INTRAVENOUS | Status: DC | PRN
  Administered 2021-12-05 (×2): 100 mg via INTRAVENOUS

## 2021-12-05 MED ORDER — FENTANYL CITRATE (PF) 100 MCG/2ML IJ SOLN
25.0000 ug | INTRAMUSCULAR | Status: DC | PRN
  Administered 2021-12-05 (×2): 25 ug via INTRAVENOUS

## 2021-12-05 MED ORDER — DEXAMETHASONE SODIUM PHOSPHATE 10 MG/ML IJ SOLN
INTRAMUSCULAR | Status: DC | PRN
  Administered 2021-12-05: 2 mg
  Administered 2021-12-05: 3 mg

## 2021-12-05 MED ORDER — STERILE WATER FOR IRRIGATION IR SOLN
Status: DC | PRN
  Administered 2021-12-05: 1000 mL

## 2021-12-05 MED ORDER — DEXAMETHASONE SODIUM PHOSPHATE 10 MG/ML IJ SOLN
INTRAMUSCULAR | Status: DC | PRN
  Administered 2021-12-05: 5 mg via INTRAVENOUS

## 2021-12-05 MED ORDER — METOCLOPRAMIDE HCL 5 MG PO TABS: 5.0000 mg | ORAL_TABLET | Freq: Three times a day (TID) | ORAL | Status: DC | PRN

## 2021-12-05 MED ORDER — MORPHINE SULFATE (PF) 2 MG/ML IV SOLN: 0.5000 mg | INTRAVENOUS | Status: DC | PRN

## 2021-12-05 MED ORDER — CEFAZOLIN SODIUM-DEXTROSE 2-4 GM/100ML-% IV SOLN
2.0000 g | INTRAVENOUS | Status: AC
  Administered 2021-12-05: 2 g via INTRAVENOUS
  Filled 2021-12-05: qty 100

## 2021-12-05 MED ORDER — MIDAZOLAM HCL 2 MG/2ML IJ SOLN
INTRAMUSCULAR | Status: AC
  Filled 2021-12-05: qty 2

## 2021-12-05 MED ORDER — ONDANSETRON HCL 4 MG/2ML IJ SOLN: 4.0000 mg | Freq: Four times a day (QID) | INTRAMUSCULAR | Status: DC | PRN

## 2021-12-05 MED ORDER — HYDROCODONE-ACETAMINOPHEN 7.5-325 MG PO TABS
1.0000 | ORAL_TABLET | ORAL | Status: DC | PRN
  Administered 2021-12-05: 2 via ORAL
  Filled 2021-12-05: qty 2

## 2021-12-05 MED ORDER — LEVOTHYROXINE SODIUM 88 MCG PO TABS
88.0000 ug | ORAL_TABLET | Freq: Every day | ORAL | Status: DC
  Administered 2021-12-06: 88 ug via ORAL
  Filled 2021-12-05: qty 1

## 2021-12-05 MED ORDER — AMLODIPINE BESYLATE 5 MG PO TABS
5.0000 mg | ORAL_TABLET | Freq: Every day | ORAL | Status: DC
  Administered 2021-12-05: 5 mg via ORAL
  Filled 2021-12-05: qty 1

## 2021-12-05 MED ORDER — METHOCARBAMOL 1000 MG/10ML IJ SOLN: 500.0000 mg | Freq: Four times a day (QID) | INTRAVENOUS | Status: DC | PRN

## 2021-12-05 MED ORDER — ONDANSETRON HCL 4 MG/2ML IJ SOLN
INTRAMUSCULAR | Status: AC
  Filled 2021-12-05: qty 2

## 2021-12-05 MED ORDER — ONDANSETRON HCL 4 MG PO TABS: 4.0000 mg | ORAL_TABLET | Freq: Four times a day (QID) | ORAL | Status: DC | PRN

## 2021-12-05 MED ORDER — ROCURONIUM BROMIDE 100 MG/10ML IV SOLN
INTRAVENOUS | Status: DC | PRN
  Administered 2021-12-05: 60 mg via INTRAVENOUS

## 2021-12-05 MED ORDER — PROMETHAZINE HCL 25 MG/ML IJ SOLN: 6.2500 mg | INTRAMUSCULAR | Status: DC | PRN

## 2021-12-05 MED ORDER — METOCLOPRAMIDE HCL 5 MG/ML IJ SOLN: 5.0000 mg | Freq: Three times a day (TID) | INTRAMUSCULAR | Status: DC | PRN

## 2021-12-05 MED ORDER — ACETAMINOPHEN 500 MG PO TABS
1000.0000 mg | ORAL_TABLET | Freq: Once | ORAL | Status: AC
  Administered 2021-12-05: 1000 mg via ORAL
  Filled 2021-12-05: qty 2

## 2021-12-05 MED ORDER — CHLORHEXIDINE GLUCONATE 0.12 % MT SOLN
15.0000 mL | OROMUCOSAL | Status: AC
  Administered 2021-12-05: 15 mL via OROMUCOSAL
  Filled 2021-12-05 (×2): qty 15

## 2021-12-05 MED ORDER — ONDANSETRON HCL 4 MG/2ML IJ SOLN
INTRAMUSCULAR | Status: DC | PRN
  Administered 2021-12-05: 4 mg via INTRAVENOUS

## 2021-12-05 MED ORDER — 0.9 % SODIUM CHLORIDE (POUR BTL) OPTIME
TOPICAL | Status: DC | PRN
  Administered 2021-12-05: 1000 mL

## 2021-12-05 MED ORDER — FENTANYL CITRATE (PF) 100 MCG/2ML IJ SOLN: 100.0000 ug | Freq: Once | INTRAMUSCULAR | Status: AC

## 2021-12-05 MED ORDER — AMISULPRIDE (ANTIEMETIC) 5 MG/2ML IV SOLN: 10.0000 mg | Freq: Once | INTRAVENOUS | Status: DC | PRN

## 2021-12-05 MED ORDER — ACETAMINOPHEN 500 MG PO TABS
500.0000 mg | ORAL_TABLET | Freq: Four times a day (QID) | ORAL | Status: DC
  Administered 2021-12-06 (×3): 500 mg via ORAL
  Filled 2021-12-05 (×3): qty 1

## 2021-12-05 MED ORDER — FENTANYL CITRATE (PF) 100 MCG/2ML IJ SOLN: 50.0000 ug | Freq: Once | INTRAMUSCULAR | Status: DC

## 2021-12-05 MED ORDER — PROPOFOL 1000 MG/100ML IV EMUL
INTRAVENOUS | Status: AC
  Filled 2021-12-05: qty 100

## 2021-12-05 MED ORDER — FENTANYL CITRATE (PF) 100 MCG/2ML IJ SOLN
INTRAMUSCULAR | Status: AC
  Administered 2021-12-05: 100 ug via INTRAVENOUS
  Filled 2021-12-05: qty 2

## 2021-12-05 SURGICAL SUPPLY — 104 items
BAG COUNTER SPONGE SURGICOUNT (BAG) IMPLANT
BANDAGE ESMARK 6X9 LF (GAUZE/BANDAGES/DRESSINGS) IMPLANT
BENZOIN TINCTURE PRP APPL 2/3 (GAUZE/BANDAGES/DRESSINGS) IMPLANT
BIT DRILL 2.4 AO COUPLING CANN (BIT) IMPLANT
BIT DRILL CANN SA OUTRIGGER NS (DRILL) IMPLANT
BIT DRILL SA 2.7 NS (DRILL) IMPLANT
BIT DRILL SA 3.5 NS (DRILL) IMPLANT
BLADE SURG 15 STRL LF DISP TIS (BLADE) ×4 IMPLANT
BLADE SURG 15 STRL SS (BLADE) ×6
BNDG ELASTIC 4X5.8 VLCR STR LF (GAUZE/BANDAGES/DRESSINGS) IMPLANT
BNDG ELASTIC 6X10 VLCR STRL LF (GAUZE/BANDAGES/DRESSINGS) ×2 IMPLANT
BNDG ELASTIC 6X5.8 VLCR STR LF (GAUZE/BANDAGES/DRESSINGS) IMPLANT
BNDG ESMARK 4X9 LF (GAUZE/BANDAGES/DRESSINGS) IMPLANT
BNDG ESMARK 6X9 LF (GAUZE/BANDAGES/DRESSINGS)
BONE CANC CHIPS 20CC PCAN1/4 (Bone Implant) ×2 IMPLANT
CHIPS CANC BONE 20CC PCAN1/4 (Bone Implant) ×2 IMPLANT
CHLORAPREP W/TINT 26 (MISCELLANEOUS) ×2 IMPLANT
CNTNR URN SCR LID CUP LEK RST (MISCELLANEOUS) IMPLANT
CONT SPEC 4OZ STRL OR WHT (MISCELLANEOUS) ×2
COVER MAYO STAND STRL (DRAPES) IMPLANT
COVER SURGICAL LIGHT HANDLE (MISCELLANEOUS) IMPLANT
CUFF TOURN SGL QUICK 34 (TOURNIQUET CUFF) ×2
CUFF TRNQT CYL 34X4.125X (TOURNIQUET CUFF) ×2 IMPLANT
DRAPE ARTHROSCOPY W/POUCH 114 (DRAPES) ×2 IMPLANT
DRAPE C-ARM 42X120 X-RAY (DRAPES) IMPLANT
DRAPE C-ARMOR (DRAPES) IMPLANT
DRAPE IMP U-DRAPE 54X76 (DRAPES) ×2 IMPLANT
DRAPE OEC MINIVIEW 54X84 (DRAPES) ×2 IMPLANT
DRAPE U-SHAPE 47X51 STRL (DRAPES) ×2 IMPLANT
DRILL CANN SA OUTRIGGER NS (DRILL) ×2
DRILL SA 2.7 NS (DRILL) ×2
DRILL SA 3.5 NS (DRILL) ×2
DRSG XEROFORM 1X8 (GAUZE/BANDAGES/DRESSINGS) ×4 IMPLANT
ELECT REM PT RETURN 9FT ADLT (ELECTROSURGICAL) ×2
ELECTRODE REM PT RTRN 9FT ADLT (ELECTROSURGICAL) ×2 IMPLANT
EXCALIBUR 3.8MM X 13CM (MISCELLANEOUS) ×2 IMPLANT
GAUZE PAD ABD 8X10 STRL (GAUZE/BANDAGES/DRESSINGS) IMPLANT
GAUZE SPONGE 4X4 12PLY STRL (GAUZE/BANDAGES/DRESSINGS) ×2 IMPLANT
GAUZE SPONGE 4X4 12PLY STRL LF (GAUZE/BANDAGES/DRESSINGS) ×2 IMPLANT
GAUZE XEROFORM 1X8 LF (GAUZE/BANDAGES/DRESSINGS) ×2 IMPLANT
GLOVE BIOGEL M STRL SZ7.5 (GLOVE) ×2 IMPLANT
GLOVE BIOGEL PI IND STRL 8 (GLOVE) ×2 IMPLANT
GOWN STRL REUS W/ TWL LRG LVL3 (GOWN DISPOSABLE) ×2 IMPLANT
GOWN STRL REUS W/ TWL XL LVL3 (GOWN DISPOSABLE) ×2 IMPLANT
GOWN STRL REUS W/TWL LRG LVL3 (GOWN DISPOSABLE) ×2
GOWN STRL REUS W/TWL XL LVL3 (GOWN DISPOSABLE) ×2
GRAFT BNE CANC CHIPS 1-8 20CC (Bone Implant) IMPLANT
K-WIRE OUTRIGGER LNG SA NS (WIRE) ×2
K-WIRE STE SA 2 (WIRE) ×2
K-WIRE TROC 1.25X150 (WIRE) ×2
KIT BASIN OR (CUSTOM PROCEDURE TRAY) ×2 IMPLANT
KWIRE OUTRIGGER LNG SA NS (WIRE) IMPLANT
KWIRE STE SA 2 (WIRE) IMPLANT
KWIRE TROC 1.25X150 (WIRE) IMPLANT
NDL 18GX1X1/2 (RX/OR ONLY) (NEEDLE) ×2 IMPLANT
NDL HYPO 25X1 1.5 SAFETY (NEEDLE) IMPLANT
NEEDLE 18GX1X1/2 (RX/OR ONLY) (NEEDLE) IMPLANT
NEEDLE HYPO 25X1 1.5 SAFETY (NEEDLE) IMPLANT
NS IRRIG 1000ML POUR BTL (IV SOLUTION) ×2 IMPLANT
PACK ORTHO EXTREMITY (CUSTOM PROCEDURE TRAY) ×2 IMPLANT
PAD CAST 4YDX4 CTTN HI CHSV (CAST SUPPLIES) ×2 IMPLANT
PADDING CAST ABS COTTON 4X4 ST (CAST SUPPLIES) IMPLANT
PADDING CAST COTTON 4X4 STRL (CAST SUPPLIES)
PADDING CAST SYNTHETIC 4X4 STR (CAST SUPPLIES) IMPLANT
PLATE ANT ANKLE SM SA LT (Plate) IMPLANT
PUTTY DBM STAGRAFT PLUS 10CC (Putty) IMPLANT
SCREW CANN FT 4.0X46MM (Screw) IMPLANT
SCREW CANN FT 4X55 (Screw) IMPLANT
SCREW CANN HEAD ST SA 6X54 (Screw) IMPLANT
SCREW LOCK SA STRAT 3.5X20 (Screw) IMPLANT
SCREW LOCK SA STRAT 3.5X24 (Screw) IMPLANT
SCREW LOCK SA STRAT 3.5X32 (Screw) IMPLANT
SCREW LOCK SA STRAT 5X24 (Screw) IMPLANT
SCREW LOCK SA STRAT 5X32 (Screw) IMPLANT
SCREW NLOCK SA STRAT 3.5X34 (Screw) IMPLANT
SHEET MEDIUM DRAPE 40X70 STRL (DRAPES) ×2 IMPLANT
SLEEVE SCD COMPRESS KNEE MED (STOCKING) ×2 IMPLANT
SPIKE FLUID TRANSFER (MISCELLANEOUS) IMPLANT
SPLINT PLASTER CAST XFAST 5X30 (CAST SUPPLIES) IMPLANT
SPONGE T-LAP 18X18 ~~LOC~~+RFID (SPONGE) IMPLANT
STOCKINETTE 6  STRL (DRAPES) ×2
STOCKINETTE 6 STRL (DRAPES) ×2 IMPLANT
STRAP ANKLE FOOT DISTRACTOR (ORTHOPEDIC SUPPLIES) ×2 IMPLANT
STRIP CLOSURE SKIN 1/2X4 (GAUZE/BANDAGES/DRESSINGS) IMPLANT
SUCTION FRAZIER HANDLE 10FR (MISCELLANEOUS) ×2
SUCTION TUBE FRAZIER 10FR DISP (MISCELLANEOUS) IMPLANT
SUT ETHILON 3 0 PS 1 (SUTURE) ×2 IMPLANT
SUT MNCRL AB 3-0 PS2 18 (SUTURE) ×2 IMPLANT
SUT PDS AB 2-0 CT2 27 (SUTURE) ×2 IMPLANT
SUT VIC AB 2-0 CT1 27 (SUTURE) ×2
SUT VIC AB 2-0 CT1 TAPERPNT 27 (SUTURE) IMPLANT
SUT VIC AB 3-0 FS2 27 (SUTURE) IMPLANT
SYR 20ML LL LF (SYRINGE) ×2 IMPLANT
SYR BULB EAR ULCER 3OZ GRN STR (SYRINGE) ×2 IMPLANT
SYR CONTROL 10ML LL (SYRINGE) IMPLANT
TOWEL GREEN STERILE FF (TOWEL DISPOSABLE) ×4 IMPLANT
TUBE CONNECTING 20X1/4 (TUBING) ×2 IMPLANT
TUBING ARTHROSCOPY IRRIG 16FT (MISCELLANEOUS) ×2 IMPLANT
UNDERPAD 30X36 HEAVY ABSORB (UNDERPADS AND DIAPERS) ×2 IMPLANT
WASHER FLAT 4.0 (Washer) IMPLANT
WIRE OLIVE LNG SA NS (WIRE) IMPLANT
WIRE OLIVE SHT SA NS (WIRE) IMPLANT
WIRE THD STE SA NS (WIRE) IMPLANT
YANKAUER SUCT BULB TIP NO VENT (SUCTIONS) IMPLANT

## 2021-12-05 NOTE — Anesthesia Procedure Notes (Signed)
Anesthesia Regional Block: Popliteal block   Pre-Anesthetic Checklist: , timeout performed,  Correct Patient, Correct Site, Correct Laterality,  Correct Procedure, Correct Position, site marked,  Risks and benefits discussed,  Surgical consent,  Pre-op evaluation,  At surgeon's request and post-op pain management  Laterality: Left  Prep: chloraprep       Needles:  Injection technique: Single-shot  Needle Type: Echogenic Stimulator Needle          Additional Needles:   Procedures:,,,, ultrasound used (permanent image in chart),,    Narrative:  Start time: 12/05/2021 11:46 AM End time: 12/05/2021 11:56 AM Injection made incrementally with aspirations every 5 mL.  Performed by: Personally  Anesthesiologist: Duane Boston, MD  Additional Notes: A functioning IV was confirmed and monitors were applied.  Sterile prep and drape, hand hygiene and sterile gloves were used.  Negative aspiration and test dose prior to incremental administration of local anesthetic. The patient tolerated the procedure well.Ultrasound  guidance: relevant anatomy identified, needle position confirmed, local anesthetic spread visualized around nerve(s), vascular puncture avoided.  Image printed for medical record. ACB supplement.

## 2021-12-05 NOTE — Transfer of Care (Signed)
Immediate Anesthesia Transfer of Care Note  Patient: Hannah Tucker  Procedure(s) Performed: LEFT ANKLE DEEP HARDWARE REMOVAL, MEDIAL AND LATERAL (Left) REPAIR OF TIBIAL NONUNION WITH AUTOGRAFT, TIBIOTALAR ARTHRODESIS (Left: Ankle)  Patient Location: PACU  Anesthesia Type:General and Regional  Level of Consciousness: awake, alert  and oriented  Airway & Oxygen Therapy: Patient Spontanous Breathing and Patient connected to nasal cannula oxygen  Post-op Assessment: Report given to RN and Post -op Vital signs reviewed and stable  Post vital signs: Reviewed and stable  Last Vitals:  Vitals Value Taken Time  BP 167/71 12/05/21 1615  Temp    Pulse 63 12/05/21 1615  Resp 20 12/05/21 1615  SpO2 97 % 12/05/21 1615  Vitals shown include unvalidated device data.  Last Pain:  Vitals:   12/05/21 1017  TempSrc:   PainSc: 3       Patients Stated Pain Goal: 0 (04/54/09 8119)  Complications: No notable events documented.

## 2021-12-05 NOTE — Anesthesia Postprocedure Evaluation (Signed)
Anesthesia Post Note  Patient: BERNEITA SANAGUSTIN  Procedure(s) Performed: LEFT ANKLE DEEP HARDWARE REMOVAL, MEDIAL AND LATERAL (Left) REPAIR OF TIBIAL NONUNION WITH AUTOGRAFT, TIBIOTALAR ARTHRODESIS (Left: Ankle)     Patient location during evaluation: PACU Anesthesia Type: Regional and General Level of consciousness: awake and alert Pain management: pain level controlled Vital Signs Assessment: post-procedure vital signs reviewed and stable Respiratory status: spontaneous breathing, nonlabored ventilation, respiratory function stable and patient connected to nasal cannula oxygen Cardiovascular status: blood pressure returned to baseline and stable Postop Assessment: no apparent nausea or vomiting Anesthetic complications: no   No notable events documented.  Last Vitals:  Vitals:   12/05/21 1645 12/05/21 1700  BP: (!) 153/68 (!) 161/69  Pulse: 63 63  Resp: 14 15  Temp:    SpO2: 97% 97%    Last Pain:  Vitals:   12/05/21 1637  TempSrc:   PainSc: Asleep                 Ephraim Reichel,W. EDMOND

## 2021-12-05 NOTE — H&P (Signed)
PREOPERATIVE H&P  Chief Complaint: Left ankle pain  HPI: Hannah Tucker is a 81 y.o. 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  Patient underwent open treatment of a trimalleolar ankle fracture with syndesmotic fixation and had a medial malleolus nonunion and persistent subluxation of the ankle joint with progressive posttraumatic arthrosis.  She had significant pain and disability due to the condition.  She is here today for surgery. Symptoms are rated as moderate to severe, and have been worsening.  This is significantly impairing activities of daily living.  She has elected for surgical management.   Past Medical History:  Diagnosis Date   Arthritis    Complication of anesthesia    nausea   Dyspnea    with exertion   Family history of adverse reaction to anesthesia    PONV- MOM   Hypertension    Hypothyroidism    PONV (postoperative nausea and vomiting)    Sleep apnea    uses CPAP nightly   Stress incontinence    Valvular insufficiency, mitral    Past Surgical History:  Procedure Laterality Date   APPLICATION OF WOUND VAC Right 01/04/2021   Procedure: APPLICATION OF WOUND VAC;  Surgeon: Altamese Franklin, MD;  Location: [facility];  Service: Orthopedics;  Laterality: Right;   BREAST CYST ASPIRATION Bilateral    neg   BREAST EXCISIONAL BIOPSY Right    1970's   CATARACT EXTRACTION W/ INTRAOCULAR LENS  IMPLANT, BILATERAL Bilateral    COLONOSCOPY     DILATION AND CURETTAGE OF UTERUS     I & D EXTREMITY Left 01/04/2021   Procedure: IRRIGATION AND DEBRIDEMENT LEFT ANKLE;  Surgeon: Altamese [place], MD;  Location: [facility];  Service: Orthopedics;  Laterality: Left;   I & D EXTREMITY Left 01/07/2021   Procedure: IRRIGATION AND DEBRIDEMENT EXTREMITY, Left ankle;  Surgeon: Altamese [place], MD;  Location: [place];  Service: Orthopedics;  Laterality: Left;   KNEE  ARTHROPLASTY Left 11/24/2016   Procedure: COMPUTER ASSISTED TOTAL KNEE ARTHROPLASTY;  Surgeon: Dereck Leep, MD;  Location: [facility];  Service: Orthopedics;  Laterality: Left;   KNEE ARTHROPLASTY Right 12/31/2020   Procedure: COMPUTER ASSISTED TOTAL KNEE ARTHROPLASTY;  Surgeon: Dereck Leep, MD;  Location: [facility];  Service: Orthopedics;  Laterality: Right;   KNEE ARTHROSCOPY Left    KNEE ARTHROSCOPY Left 07/18/2015   Procedure: ARTHROSCOPY KNEE WITH MEDIAL COMPARTMENTAL MENICUS CHONDROPLASTY;  Surgeon: Dereck Leep, MD;  Location: [facility];  Service: Orthopedics;  Laterality: Left;   KNEE ARTHROSCOPY WITH LATERAL MENISECTOMY Left 07/18/2015   Procedure: LEFT KNEE ARTHROSCOPY WITH PARTIAL LATERAL MENISECTOMY GRADE 4 CHONDROMYLASIA LATERAL TIBIAL PLATEAU;  Surgeon: Dereck Leep, MD;  Location: [facility];  Service: Orthopedics;  Laterality: Left;   ORIF ANKLE FRACTURE Left 01/04/2021   Procedure: OPEN REDUCTION INTERNAL FIXATION (ORIF) ANKLE FRACTURE;  Surgeon: Altamese [place], MD;  Location: [place];  Service: Orthopedics;  Laterality: Left;   ORIF ANKLE FRACTURE Left 01/07/2021   Procedure: SCREW REMOVAL ANKLE REMOVAL OF WOUND VAC LEFT LEG;  Surgeon: Altamese [place], MD;  Location: [facility];  Service: Orthopedics;  Laterality: Left;   Social History   Socioeconomic History   Marital status: Married    Spouse name: Gwyndolyn Saxon   Number of children: Not on file   Years of education: Not on file   Highest education level: Not on file  Occupational History   Not  on file  Tobacco Use   Smoking status: Never   Smokeless tobacco: Never  Vaping Use   Vaping Use: Never used  Substance and Sexual Activity   Alcohol use: No   Drug use: No   Sexual activity: Not on file  Other Topics Concern   Not on file  Social History Narrative   Not on file   Social Determinants of Health   Financial Resource Strain: Not on file  Food Insecurity: Not on file  Transportation Needs: Not on file   Physical Activity: Not on file  Stress: Not on file  Social Connections: Not on file   Family History  Problem Relation Age of Onset   Breast cancer Neg Hx    Allergies  Allergen Reactions   Benadryl [Diphenhydramine Hcl] Other (See Comments)    Pt unsure what reaction is but says she has not taken this in years because of a reaction   Biaxin [Clarithromycin] Other (See Comments)    Unknown   Ciprofloxacin Other (See Comments)    Joint pain    Prior to Admission medications   Medication Sig Start Date End Date Taking? Authorizing Provider  acetaminophen (TYLENOL) 500 MG tablet Take 500 mg by mouth every 6 (six) hours as needed for moderate pain or headache.   Yes [provider]  amLODipine (NORVASC) 5 MG tablet Take 5 mg by mouth at bedtime. 10/15/20  Yes [provider]  Ascorbic Acid (VITAMIN C) 1000 MG tablet Take 1,000 mg by mouth in the morning and at bedtime.   Yes [provider]  Biotin 10000 MCG TABS Take 1,000 mcg by mouth daily.   Yes [provider]  Calcium Carb-Cholecalciferol (CALCIUM 600 + D PO) Take 1 tablet by mouth daily.    Yes [provider]  levothyroxine (SYNTHROID, LEVOTHROID) 88 MCG tablet Take 88 mcg by mouth daily before breakfast.   Yes [provider]  losartan-hydrochlorothiazide (HYZAAR) 100-12.5 MG tablet Take 1 tablet by mouth every morning.   Yes [provider]  Multiple Vitamins-Minerals (PRESERVISION AREDS 2+MULTI VIT PO) Take 1 capsule by mouth 2 (two) times daily. Gelcap   Yes [provider]  Polyethyl Glycol-Propyl Glycol (SYSTANE OP) Place 1 drop into both eyes 4 (four) times daily. Ultra   Yes [provider]  docusate sodium (COLACE) 100 MG capsule Take 1 capsule (100 mg total) by mouth 2 (two) times daily. Patient not taking: Reported on 11/28/2021 01/18/21   Love, Ivan Anchors, PA-C  feeding supplement (ENSURE ENLIVE / ENSURE PLUS) LIQD Take 237 mLs by mouth 2  (two) times daily between meals. Patient not taking: Reported on 11/28/2021 01/08/21   Terrilee Croak, MD  ketorolac (ACULAR) 0.5 % ophthalmic solution Place 1 drop into the right eye every 8 (eight) hours as needed (abrasion). Patient not taking: Reported on 11/28/2021 01/18/21   Love, Ivan Anchors, PA-C  magnesium gluconate (MAGONATE) 500 MG tablet Take 0.5 tablets (250 mg total) by mouth at bedtime. Patient not taking: Reported on 11/28/2021 01/18/21   Love, Ivan Anchors, PA-C  methocarbamol (ROBAXIN) 500 MG tablet Take 1 tablet (500 mg total) by mouth every 8 (eight) hours as needed for muscle spasms. Patient not taking: Reported on 11/28/2021 01/18/21   Love, Ivan Anchors, PA-C  polycarbophil (FIBERCON) 625 MG tablet Take 1 tablet (625 mg total) by mouth daily. Patient not taking: Reported on 11/28/2021 01/19/21   Bary Leriche, PA-C     Positive ROS: All other systems have  been reviewed and were otherwise negative with the exception of those mentioned in the HPI and as above.  Physical Exam:  Vitals:   12/05/21 0943  BP: (!) 176/69  Pulse: 71  Resp: 17  Temp: 97.9 F (36.6 C)  SpO2: 96%   General: Alert, no acute distress Cardiovascular: No pedal edema Respiratory: No cyanosis, no use of accessory musculature GI: No organomegaly, abdomen is soft and non-tender Skin: No lesions in the area of chief complaint Neurologic: Sensation intact distally Psychiatric: Patient is competent for consent with normal mood and affect Lymphatic: No axillary or cervical lymphadenopathy  MUSCULOSKELETAL: Left ankle demonstrates swelling.  Tenderness palpation along the medial aspect of the ankle.  Tenderness laterally along the hardware.  Tenderness in the anterior aspect of the ankle joint.  Pain with range of motion of the ankle.  Well-healed surgical incisions with no sign of infection.  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  Retained orthopedic hardware   Plan: Plan for  deep hardware removal with repair of the tibial malunion and ankle arthrodesis.  Patient will be admitted for observation overnight and to work with physical therapy.  Likely discharge tomorrow unless she is deemed necessary for SNF placement.  We discussed the risks, benefits and alternatives of surgery which include but are not limited to wound healing complications, infection, nonunion, malunion, need for further surgery, damage to surrounding structures and continued pain.  They understand there is no guarantees to an acceptable outcome.  After weighing these risks they opted to proceed with surgery.     Erle Crocker, MD    12/05/2021 12:34 PM

## 2021-12-05 NOTE — Anesthesia Preprocedure Evaluation (Addendum)
Anesthesia Evaluation  Patient identified by MRN, date of birth, ID band Patient awake    Reviewed: Allergy & Precautions, NPO status , Patient's Chart, lab work & pertinent test results  History of Anesthesia Complications (+) PONV and history of anesthetic complications  Airway Mallampati: II  TM Distance: >3 FB     Dental no notable dental hx. (+) Dental Advisory Given   Pulmonary sleep apnea and Continuous Positive Airway Pressure Ventilation    Pulmonary exam normal        Cardiovascular hypertension, Pt. on medications Normal cardiovascular exam     Neuro/Psych negative neurological ROS  negative psych ROS   GI/Hepatic negative GI ROS, Neg liver ROS,,,  Endo/Other  Hypothyroidism    Renal/GU negative Renal ROS  negative genitourinary   Musculoskeletal  (+) Arthritis ,    Abdominal   Peds  Hematology negative hematology ROS (+)   Anesthesia Other Findings   Reproductive/Obstetrics negative OB ROS                             Anesthesia Physical  Anesthesia Plan  ASA: 2  Anesthesia Plan: Regional and General   Post-op Pain Management:  Regional for Post-op pain and Regional block* and Tylenol PO (pre-op)*   Induction: Intravenous  PONV Risk Score and Plan: 3 and Treatment may vary due to age or medical condition, Ondansetron, Propofol infusion and TIVA  Airway Management Planned: Oral ETT  Additional Equipment: None  Intra-op Plan:   Extubation in ORPost-operative Plan:   Informed Consent: I have reviewed the patients History and Physical, chart, labs and discussed the procedure including the risks, benefits and alternatives for the proposed anesthesia with the patient or authorized representative who has indicated his/her understanding and acceptance.     Dental advisory given  Plan Discussed with: Anesthesiologist, CRNA and Surgeon  Anesthesia Plan Comments:         Anesthesia Quick Evaluation

## 2021-12-05 NOTE — Anesthesia Procedure Notes (Signed)
Procedure Name: Intubation Date/Time: 12/05/2021 1:18 PM  Performed by: Darletta Moll, CRNAPre-anesthesia Checklist: Patient identified, Emergency Drugs available, Suction available and Patient being monitored Patient Re-evaluated:Patient Re-evaluated prior to induction Oxygen Delivery Method: Circle system utilized Preoxygenation: Pre-oxygenation with 100% oxygen Induction Type: IV induction Ventilation: Mask ventilation without difficulty Laryngoscope Size: Mac and 3 Grade View: Grade I Tube type: Oral Tube size: 7.0 mm Number of attempts: 1 Airway Equipment and Method: Stylet Placement Confirmation: ETT inserted through vocal cords under direct vision, positive ETCO2 and breath sounds checked- equal and bilateral Secured at: 21 cm Tube secured with: Tape Dental Injury: Teeth and Oropharynx as per pre-operative assessment

## 2021-12-06 DIAGNOSIS — M19172 Post-traumatic osteoarthritis, left ankle and foot: Secondary | ICD-10-CM | POA: Diagnosis not present

## 2021-12-06 LAB — CBC
HCT: 38 % (ref 36.0–46.0)
Hemoglobin: 12.7 g/dL (ref 12.0–15.0)
MCH: 27.1 pg (ref 26.0–34.0)
MCHC: 33.4 g/dL (ref 30.0–36.0)
MCV: 81.2 fL (ref 80.0–100.0)
Platelets: 278 10*3/uL (ref 150–400)
RBC: 4.68 MIL/uL (ref 3.87–5.11)
RDW: 14.4 % (ref 11.5–15.5)
WBC: 10.1 10*3/uL (ref 4.0–10.5)
nRBC: 0 % (ref 0.0–0.2)

## 2021-12-06 LAB — BASIC METABOLIC PANEL
Anion gap: 8 (ref 5–15)
BUN: 13 mg/dL (ref 8–23)
CO2: 27 mmol/L (ref 22–32)
Calcium: 9.2 mg/dL (ref 8.9–10.3)
Chloride: 103 mmol/L (ref 98–111)
Creatinine, Ser: 0.8 mg/dL (ref 0.44–1.00)
GFR, Estimated: >60 mL/min (ref >60)
Glucose, Bld: 165 mg/dL — ABNORMAL HIGH (ref 70–99)
Potassium: 3.8 mmol/L (ref 3.5–5.1)
Sodium: 138 mmol/L (ref 135–145)

## 2021-12-06 MED ORDER — ASPIRIN 81 MG PO TBEC: 81.0000 mg | DELAYED_RELEASE_TABLET | Freq: Two times a day (BID) | ORAL | 0 refills | Status: AC

## 2021-12-06 MED ORDER — OXYCODONE-ACETAMINOPHEN 5-325 MG PO TABS: 1.0000 | ORAL_TABLET | ORAL | 0 refills | Status: AC | PRN

## 2021-12-06 NOTE — Progress Notes (Deleted)
PT Cancellation Note  Patient Details Name: Hannah Tucker MRN: [id number] DOB: [birth year]   Cancelled Treatment:    Reason Eval/Treat Not Completed: Other (comment) MD, please address WB status prior to PT evaluation. Will follow. Thanks.   Marguarite Arbour A Derelle Cockrell 12/06/2021, 7:17 AM Marisa Severin, PT, DPT Acute Rehabilitation Services Secure chat preferred Office [phone]

## 2021-12-06 NOTE — Evaluation (Signed)
Occupational Therapy Evaluation and Discharge Summary Patient Details Name: Hannah Tucker MRN: [id number] DOB: March 11, 1940 Today's Date: 12/06/2021   History of Present Illness Patient is a 81 y/o female who presents on 10/5 with left ankle pain s/p left ankle deep hardware removal, medial and lateral repair of tibial nonunion with autograft and tibiotalar arthrodesis 12/05/21. PMH includes HTN, valvular insufficiency, SUI.   Clinical Impression   P[t admitted with the above diagnosis and overall is doing very well. Pt is currently requiring min assist at most with all basic adls and adl transfers. Pt fx her ankle some time ago and has been accustomed to all the techniques used to transfer and do adls during that time. Pt's husband is available to assist although cannot lift with his LUE. Pt should not need lifting help and feel pt is safe to d/c home with her husband and no follow up therapy.      Recommendations for follow up therapy are one component of a multi-disciplinary discharge planning process, led by the attending physician.  Recommendations may be updated based on patient status, additional functional criteria and insurance authorization.   Follow Up Recommendations  No OT follow up    Assistance Recommended at Discharge PRN  Patient can return home with the following A little help with bathing/dressing/bathroom;Help with stairs or ramp for entrance    Functional Status Assessment  Patient has not had a recent decline in their functional status  Equipment Recommendations  None recommended by OT    Recommendations for Other Services       Precautions / Restrictions Precautions Precautions: Fall Required Braces or Orthoses: Splint/Cast Restrictions Weight Bearing Restrictions: Yes LLE Weight Bearing: Non weight bearing      Mobility Bed Mobility Overal bed mobility: Modified Independent             General bed mobility comments: No assist needed.     Transfers Overall transfer level: Needs assistance Equipment used: Rolling walker (2 wheels) Transfers: Sit to/from Stand, Bed to chair/wheelchair/[name] Sit to Stand: Supervision, From elevated surface Stand pivot transfers: Supervision         General transfer comment: Supervision for safety to simulate bed height at home. Stood from [employer], from chair x4, good technique. SPT multiple times to/from chair/bed/stairs with use of RW.      Balance Overall balance assessment: Mild deficits observed, not formally tested                                         ADL either performed or assessed with clinical judgement   ADL Overall ADL's : Needs assistance/impaired Eating/Feeding: Independent;Sitting   Grooming: Wash/dry hands;Wash/dry face;Oral care;Applying deodorant;Set up;Sitting   Upper Body Bathing: Set up;Sitting   Lower Body Bathing: Min guard;Sit to/from stand   Upper Body Dressing : Set up;Sitting   Lower Body Dressing: Min guard;Sit to/from stand;Cueing for compensatory techniques   Toilet Transfer: Min guard;Stand-pivot;[name]/3in1;Rolling walker (2 wheels)   Toileting- Clothing Manipulation and Hygiene: Supervision/safety;Sitting/lateral lean       Functional mobility during ADLs: Min guard;Rolling walker (2 wheels) General ADL Comments: Pt doing very well with adls. pt is accustomed to all techniques due to fx happening awhile ago.     Vision Baseline Vision/History: 0 No visual deficits Ability to See in Adequate Light: 0 Adequate Patient Visual Report: No change from baseline Vision Assessment?: No apparent visual deficits  Perception     Praxis      Pertinent Vitals/Pain Pain Assessment Pain Assessment: 0-10 Pain Score: 5  Pain Location: LLE Pain Descriptors / Indicators: Sore, Operative site guarding Pain Intervention(s): Limited activity within patient's tolerance, Monitored during session, Repositioned     Hand Dominance  Right   Extremity/Trunk Assessment Upper Extremity Assessment Upper Extremity Assessment: Overall WFL for tasks assessed   Lower Extremity Assessment Lower Extremity Assessment: Defer to PT evaluation LLE Deficits / Details: Sensation intact, can perform SLR without difficulty. LLE Sensation: WNL   Cervical / Trunk Assessment Cervical / Trunk Assessment: Normal   Communication Communication Communication: No difficulties   Cognition Arousal/Alertness: Awake/alert Behavior During Therapy: WFL for tasks assessed/performed Overall Cognitive Status: Within Functional Limits for tasks assessed                                       General Comments  Pt doing well with all adls and has husband at homet to assist as needed. Husband is somewhat limited due to arm fx but pt does not  need lifting help.    Exercises     Shoulder Instructions      Home Living Family/patient expects to be discharged to:: Private residence Living Arrangements: Spouse/significant other Available Help at Discharge: Family;Available 24 hours/day Type of Home: House Home Access: Stairs to enter [employer] of Steps: 3 Entrance Stairs-Rails: Right Home Layout: Two level;Laundry or work area in basement     [employer] Shower/Tub: Teacher, early years/pre: Handicapped height Bathroom Accessibility: Yes How Accessible: Accessible via wheelchair;Accessible via [place]: Conservation officer, nature (2 wheels);Shower seat;[name]/3in1;Cane - single point;Crutches;Rollator (4 wheels);Other (comment)   Additional Comments: knee scooter, pt's spouse has a broken arm to limited to 5 lbs lifting restrictions      Prior Functioning/Environment Prior Level of Function : Independent/Modified Independent             Mobility Comments: using RW ADLs Comments: Pt independent with ADLs/IADLs        OT Problem List:        OT Treatment/Interventions:      OT Goals(Current  goals can be found in the care plan section) Acute Rehab OT Goals Patient Stated Goal: to go home OT Goal Formulation: All assessment and education complete, DC therapy  OT Frequency:      Co-evaluation              AM-PAC OT "6 Clicks" Daily Activity     Outcome Measure Help from another person eating meals?: None Help from another person taking care of personal grooming?: None Help from another person toileting, which includes using toliet, bedpan, or urinal?: A Little Help from another person bathing (including washing, rinsing, drying)?: None Help from another person to put on and taking off regular upper body clothing?: None Help from another person to put on and taking off regular lower body clothing?: A Little 6 Click Score: 22   End of Session Equipment Utilized During Treatment: Rolling walker (2 wheels) Nurse Communication: Mobility status  Activity Tolerance: Patient tolerated treatment well Patient left: in chair;with call bell/phone within reach;with chair alarm set  OT Visit Diagnosis: Unsteadiness on feet (R26.81)                Time: 6010-9323 OT Time Calculation (min): 28 min Charges:  OT General Charges $OT Visit: 1 Visit OT  Evaluation $OT Eval Low Complexity: 1 Low OT Treatments $Self Care/Home Management : 8-22 mins  Hope Budds 12/06/2021, 11:29 AM

## 2021-12-06 NOTE — Plan of Care (Signed)
  Problem: Activity: Goal: Risk for activity intolerance will decrease Outcome: Progressing   Problem: Nutrition: Goal: Adequate nutrition will be maintained Outcome: Progressing   Problem: Coping: Goal: Level of anxiety will decrease Outcome: Progressing   Problem: Pain Managment: Goal: General experience of comfort will improve Outcome: Progressing   Problem: Skin Integrity: Goal: Risk for impaired skin integrity will decrease Outcome: Progressing   

## 2021-12-06 NOTE — Discharge Instructions (Signed)

## 2021-12-06 NOTE — Progress Notes (Signed)
     Hannah Tucker is a 81 y.o. 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  Retained orthopedic hardware  Surgery: Left ankle hardware removal, repair of medial malleolus nonunion, and left ankle arthrodesis 12/05/2021  Subjective: Patient appears comfortable in bed.  She states pain is well controlled.  She is eating and drinking well.  She is hopeful to be discharged home today.  She is planning for her husband, friends, and family to help her at home.  She did work with physical therapy preoperatively for nonweightbearing training. She has not yet worked with physical therapy post operatively.  Hypokalemia observed preoperatively has resolved.  Hemoglobin stable.  No new concerns.  Objectyive: Vitals:   12/06/21 0200 12/06/21 0500  BP:  138/66  Pulse: (!) 58 (!) 55  Resp:  18  Temp:    SpO2: 93% 96%     Exam: Awake and alert Respirations even and unlabored No acute distress  Left lower extremity demonstrates well fitted, clean, dry lower extremity splint.  Exposed skin is benign.  No significant tenderness proximal or distal to the splint.  The foot and ankle is held in a neutral position.  She can wiggle the toes and has intact sensation to light touch in the toes.  Calf soft nontender.  Skin is benign.  Range of motion at the knee intact.  Toes warm and well-perfused distally.  Assessment: Postop day 1 status post the above, doing well and wishing to be discharged home today to the care of helpful family and friends.   Plan: We discussed disposition options. She really wants to go home and does have a plan for this and have people to take care of her.  This is reasonable.  We will still have her work with physical therapy today prior to discharge to ensure she is suitable for discharge home from there standpoint.  She does have a knee scooter at home already.  -Nonweightbearing left lower extremity with use of crutches, walker, or  knee scooter -Keep splint clean, dry, and intact -Oxycodone for pain control -81 mg aspirin twice daily x1 month for DVT prophylaxis  Follow-up appointment with Dr. Lucia Gaskins at [facility] 2 weeks from surgery for splint removal, suture removal appropriate, and radiographs.   Zoella Roberti J. [place], PA-C

## 2021-12-06 NOTE — Evaluation (Signed)
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 PMH includes HTN, valvular insufficiency, SUI.  Clinical Impression  Patient presents with pain and post surgical deficits s/p above surgery. Pt lives at home with her spouse (who recently broke his arm and is on a 5 lb weight lifting restriction) and reports being Mod I for ADLs and using RW for ambulation. Pt has been going to OPPT for strengthening prior to surgery. Today, pt is moving really well at Mod I level for bed mobility, supervision/min guard for standing and transfers and tolerated stair training using shower chair bump up method with Min A and rail for support.  Pt able to maintain NWB of LLE with hopping and transfers. Lengthy education on knee scooter, stair negotiation techniques using shower chair, safety at home, there ex etc. PLans to d/c home this afternoon with support of spouse. Will follow acutely to maximize independence and mobility prior to return home.     Recommendations for follow up therapy are one component of a multi-disciplinary discharge planning process, led by the attending physician.  Recommendations may be updated based on patient status, additional functional criteria and insurance authorization.  Follow Up Recommendations Follow physician's recommendations for discharge plan and follow up therapies      Assistance Recommended at Discharge Intermittent Supervision/Assistance  Patient can return home with the following  A little help with walking and/or transfers;Help with stairs or ramp for entrance;Assistance with cooking/housework;Assist for transportation    Equipment Recommendations None recommended by PT   Recommendations for Other Services       Functional Status Assessment Patient has had a recent decline in their functional status and demonstrates the ability to make significant improvements in function in a reasonable and predictable amount of time.     Precautions / Restrictions Precautions Precautions: Fall Required Braces or Orthoses: Splint/Cast Restrictions Weight Bearing Restrictions: Yes LLE Weight Bearing: Non weight bearing      Mobility  Bed Mobility Overal bed mobility: Modified Independent             General bed mobility comments: No assist needed.    Transfers Overall transfer level: Needs assistance Equipment used: Rolling walker (2 wheels) Transfers: Sit to/from Stand, Bed to chair/wheelchair/[name] Sit to Stand: Supervision, From elevated surface Stand pivot transfers: Min guard         General transfer comment: Supervision for safety to simulate bed height at home. Stood from [employer], from chair x4, good technique. SPT multiple times to/from chair/bed/stairs with use of RW.    Ambulation/Gait Ambulation/Gait assistance: Min guard Gait Distance (Feet): 2 Feet (x3) Assistive device: Rolling walker (2 wheels)   Gait velocity: decreased     General Gait Details: Able to take a few hops here and there wtihout difficulty and Min guard assist. maintains NWB LLE.  Stairs Stairs: Yes Stairs assistance: Mod assist, Min assist Stair Management: Forwards, With crutches, Seated/boosting, One rail Right, Step to pattern Number of Stairs: 2 (x2 bouts) General stair comments: Performed first withc rutch and rail to hop forwards up stairs however pt missed first step and placed RLE on floor, mod A on second stair. Then tried using shower chair to bump up the steps needing Min A to  stand on RLE and rail for support. Preferred this method.  Wheelchair Mobility    Modified Rankin (Stroke Patients Only)       Balance Overall balance assessment: Mild  deficits observed, not formally tested                                           Pertinent Vitals/Pain Pain Assessment Pain Assessment: 0-10 Pain Score: 5  Pain Location: LLE Pain Descriptors / Indicators: Sore, Operative site guarding Pain Intervention(s): Monitored during session, Repositioned    Home Living Family/patient expects to be discharged to:: Private residence Living Arrangements: Spouse/significant other Available Help at Discharge: Family;Available 24 hours/day Type of Home: House Home Access: Stairs to enter Entrance Stairs-Rails: Right Entrance Stairs-Number of Steps: 3   Home Layout: Two level;Laundry or work area in [place]: Conservation officer, nature (2 wheels);Shower seat;[name]/3in1;Cane - single point;Crutches;Rollator (4 wheels);Other (comment) Additional Comments: knee scooter, pt's spouse has a broken arm to limited to 5 lbs lifting restrictions    Prior Function Prior Level of Function : Independent/Modified Independent             Mobility Comments: using RW ADLs Comments: Pt independent with ADLs/IADLs     Hand Dominance   Dominant Hand: Right    Extremity/Trunk Assessment   Upper Extremity Assessment Upper Extremity Assessment: Defer to OT evaluation    Lower Extremity Assessment Lower Extremity Assessment: LLE deficits/detail LLE Deficits / Details: Sensation intact, can perform SLR without difficulty. LLE Sensation: WNL       Communication   Communication: No difficulties  Cognition Arousal/Alertness: Awake/alert Behavior During Therapy: WFL for tasks assessed/performed Overall Cognitive Status: Within Functional Limits for tasks assessed                                          General Comments      Exercises General Exercises - Lower Extremity Straight Leg Raises: Strengthening, Left, 5 reps (and into abduction)   Assessment/Plan    PT Assessment Patient needs continued PT  services  PT Problem List Decreased mobility;Decreased balance;Pain;Decreased skin integrity;Decreased activity tolerance       PT Treatment Interventions Therapeutic activities;Gait training;Therapeutic exercise;Patient/family education;Balance training;Functional mobility training    PT Goals (Current goals can be found in the Care Plan section)  Acute Rehab PT Goals Patient Stated Goal: to go home and return to independence PT Goal Formulation: With patient Time For Goal Achievement: 12/20/21 Potential to Achieve Goals: Good    Frequency Min 3X/week     Co-evaluation               AM-PAC PT "6 Clicks" Mobility  Outcome Measure Help needed turning from your back to your side while in a flat bed without using bedrails?: None Help needed moving from lying on your back to sitting on the side of a flat bed without using bedrails?: None Help needed moving to and from a bed to a chair (including a wheelchair)?: A Little Help needed standing up from a chair using your arms (e.g., wheelchair or bedside chair)?: A Little Help needed to walk in hospital room?: A Little Help needed climbing 3-5 steps with a railing? : A Lot 6 Click Score: 19    End of Session Equipment Utilized During Treatment: Gait  belt Activity Tolerance: Patient tolerated treatment well Patient left: in chair;with call bell/phone within reach;with chair alarm set Nurse Communication: Mobility status PT Visit Diagnosis: Pain;Difficulty in walking, not elsewhere classified (R26.2) Pain - Right/Left: Left Pain - part of body: Leg    Time: 9470-9628 PT Time Calculation (min) (ACUTE ONLY): 60 min   Charges:   PT Evaluation $PT Eval Moderate Complexity: 1 Mod PT Treatments $Gait Training: 8-22 mins $Therapeutic Activity: 23-37 mins        Vale Haven, PT, DPT Acute Rehabilitation Services Secure chat preferred Office [phone]     Hannah Tucker 12/06/2021, 9:55 AM

## 2021-12-06 NOTE — Discharge Summary (Signed)
Patient ID: Hannah Tucker MRN: [id number] DOB/AGE: February 21, 1941 81 y.o.  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 female who was admitted 12/05/2021 for operative treatment ofArthritis of left ankle. Patient has severe unremitting pain that affects sleep, daily activities, and work/hobbies. After pre-op clearance the patient was taken to the operating room on 12/05/2021 and underwent  Procedure(s): LEFT ANKLE DEEP HARDWARE REMOVAL, MEDIAL AND LATERAL REPAIR OF TIBIAL NONUNION WITH AUTOGRAFT, TIBIOTALAR ARTHRODESIS.    Patient was given perioperative antibiotics:  Anti-infectives (From admission, onward)    Start     Dose/Rate Route Frequency Ordered Stop   12/05/21 1900  ceFAZolin (ANCEF) IVPB 1 g/50 mL premix        1 g 100 mL/hr over 30 Minutes Intravenous Every 6 hours 12/05/21 1812 12/06/21 0618   12/05/21 0915  ceFAZolin (ANCEF) IVPB 2g/100 mL premix        2 g 200 mL/hr over 30 Minutes Intravenous On call to O.R. 12/05/21 0900 12/05/21 1300        Patient was given sequential compression devices, early ambulation, and chemoprophylaxis to prevent DVT.  Patient benefited maximally from hospital stay and there were no complications.    Recent vital signs:  Patient Vitals for the past 24 hrs:  BP Temp Temp src Pulse Resp SpO2  12/06/21 0847 -- -- -- -- 18 --  12/06/21 0830 (!) 145/67 98.2 F (36.8 C) Oral 74 16 97 %  12/06/21 0802 -- -- -- -- 18 --  12/06/21 0500 138/66 -- -- (!) 55 18 96 %  12/06/21 0200 -- -- -- (!) 58 -- 93 %  12/06/21 0100 129/63 98.2 F (36.8 C) Oral -- 18 --  12/05/21 2106 (!) 148/66 97.6 F (36.4 C) Oral (!) 59 18 97 %  12/05/21 1808 (!) 169/96 97.6 F (36.4 C) Oral 69 17 96 %  12/05/21 1745 (!) 153/71 -- -- (!) 59 14 92 %  12/05/21 1730 (!) 155/68 -- -- (!) 59 14 95 %  12/05/21 1715 (!) 158/70 97.7 F (36.5 C) -- 64 17 95 %  12/05/21 1700 (!) 161/69 -- -- 63 15 97 %  12/05/21 1645 (!) 153/68 -- -- 63 14 97 %  12/05/21 1637 (!) 149/65 -- -- 61 15 97 %  12/05/21 1630 (!) 149/65 -- -- 61 17 97 %  12/05/21 1615 (!) 167/71 -- -- 63 20 98 %  12/05/21 1607 (!) 162/68 97.8 F (36.6 C) -- 61 12 97 %     Recent laboratory studies:  Recent Labs    12/05/21 1024 12/06/21 0237  WBC 5.6 10.1  HGB 14.2 12.7  HCT 44.5 38.0  PLT 282 278  NA 139 138  K 2.9* 3.8  CL 104 103  CO2 24 27  BUN 14 13  CREATININE 0.64 0.80  GLUCOSE 91 165*  CALCIUM 9.2 9.2     Discharge Medications:   Allergies as of 12/06/2021       Reactions   Benadryl [diphenhydramine Hcl] Other (See Comments)   Pt unsure what reaction is but says she has not taken this in years because of a reaction   Biaxin [clarithromycin] Other (See Comments)   Unknown   Ciprofloxacin Other (See Comments)   Joint pain        Medication List     TAKE these medications    acetaminophen 500 MG tablet Commonly known as: TYLENOL Take 500 mg by mouth every 6 (six) hours as needed for moderate pain or headache.   amLODipine 5 MG tablet Commonly known as: NORVASC Take 5 mg by mouth at bedtime.   aspirin EC 81 MG tablet Take 1 tablet (81 mg total) by mouth 2 (two) times daily. For blood clot prevention   Biotin 35009 MCG Tabs Take 1,000 mcg by  mouth daily.   CALCIUM 600 + D PO Take 1 tablet by mouth daily.   docusate sodium 100 MG capsule Commonly known as: COLACE Take 1 capsule (100 mg total) by mouth 2 (two) times daily.   feeding supplement Liqd Take 237 mLs by mouth 2 (two) times daily between meals.   ketorolac 0.5 % ophthalmic solution Commonly known as: ACULAR Place 1 drop into the right eye every 8 (eight) hours as needed (abrasion).   levothyroxine 88 MCG tablet Commonly known as: SYNTHROID Take 88 mcg by mouth daily before breakfast.   losartan-hydrochlorothiazide 100-12.5 MG tablet Commonly known as: HYZAAR Take 1 tablet by mouth every morning.   magnesium gluconate 500 MG tablet Commonly known as: MAGONATE Take 0.5 tablets (250 mg total) by mouth at bedtime.   methocarbamol 500 MG tablet Commonly known as: ROBAXIN Take 1 tablet (500 mg total) by mouth every 8 (eight) hours as needed for muscle spasms.   oxyCODONE-acetaminophen 5-325 MG tablet Commonly known as: Percocet Take 1 tablet by mouth every 4 (four) hours as needed for severe pain.   polycarbophil 625 MG tablet Commonly known as: FIBERCON Take 1 tablet (625 mg total) by mouth daily.   PRESERVISION AREDS 2+MULTI VIT PO Take 1 capsule by mouth 2 (two) times daily. Gelcap   SYSTANE OP Place 1 drop into both eyes 4 (four) times daily. Ultra   vitamin C 1000 MG tablet Take 1,000 mg by mouth in the morning and at bedtime.               Discharge Care Instructions  (From admission, onward)           Start     Ordered   12/06/21 0000  Non weight bearing       Question Answer Comment  Laterality left   Extremity Lower      12/06/21 0811            Diagnostic Studies: DG Ankle Complete Left  Result Date: 12/05/2021 CLINICAL DATA:  Fluoroscopic assistance for revision of internal fixation in left ankle EXAM: LEFT ANKLE COMPLETE - 3+ VIEW COMPARISON:  CT done on 09/24/2021 FINDINGS: There is interval placement of a new  longer screw transfixing the medial malleolus. There is a new metallic plate along the anterior margin of distal tibia anchored to tibia and talus. Fluoroscopic time 1 minute and 11 seconds. Radiation dose  1.46 mGy. IMPRESSION: Fluoroscopic assistance was provided for revision of internal fixation in left ankle. Electronically Signed   By: Elmer Picker M.D.   On: 12/05/2021 16:26    Disposition: Discharge disposition: 01-Home or Self Care      Plan: We discussed disposition options. She really wants to go home and does have a plan for this and have people to take care of her.  This is reasonable.  We will still have her work with physical therapy today prior to discharge to ensure she is suitable for discharge home from there standpoint.  She does have a knee scooter at home already.   -Nonweightbearing left lower extremity with use of crutches, walker, or knee scooter -Keep splint clean, dry, and intact -Oxycodone for pain control -81 mg aspirin twice daily x1 month for DVT prophylaxis   Follow-up appointment with Dr. Lucia Gaskins at [facility] 2 weeks from surgery for splint removal, suture removal appropriate, and radiographs. Discharge Instructions     Call MD / Call 911   Complete by: As directed    If you experience chest pain or shortness of breath, CALL 911 and be transported to the hospital emergency room.  If you develope a fever above 101 F, pus (white drainage) or increased drainage or redness at the wound, or calf pain, call your surgeon's office.   Constipation Prevention   Complete by: As directed    Drink plenty of fluids.  Prune juice may be helpful.  You may use a stool softener, such as Colace (over the counter) 100 mg twice a day.  Use MiraLax (over the counter) for constipation as needed.   Diet - low sodium heart healthy   Complete by: As directed    Non weight bearing   Complete by: As directed    Laterality: left   Extremity: Lower   Post-operative  opioid taper instructions:   Complete by: As directed    POST-OPERATIVE OPIOID TAPER INSTRUCTIONS: It is important to wean off of your opioid medication as soon as possible. If you do not need pain medication after your surgery it is ok to stop day one. Opioids include: Codeine, Hydrocodone(Norco, Vicodin), Oxycodone(Percocet, oxycontin) and hydromorphone amongst others.  Long term and even short term use of opiods can cause: Increased pain response Dependence Constipation Depression Respiratory depression And more.  Withdrawal symptoms can include Flu like symptoms Nausea, vomiting And more Techniques to manage these symptoms Hydrate well Eat regular healthy meals Stay active Use relaxation techniques(deep breathing, meditating, yoga) Do Not substitute Alcohol to help with tapering If you have been on opioids for less than two weeks and do not have pain than it is ok to stop all together.  Plan to wean off of opioids This plan should start within one week post op of your joint replacement. Maintain the same interval or time between taking each dose and first decrease the dose.  Cut the total daily intake of opioids by one tablet each day Next start to increase the time between doses. The last dose that should be eliminated is the evening dose.           Follow-up Information     Hannah Crocker, MD Follow up in 2 week(s).   Specialty: Orthopedic Surgery Contact information: [place] [address] [phone]                  Signed: Lexandra Rettke J [place] 12/06/2021, 1:50 PM

## 2021-12-09 ENCOUNTER — Encounter (HOSPITAL_COMMUNITY): Payer: Self-pay | Admitting: Orthopaedic Surgery

## 2021-12-09 ENCOUNTER — Encounter: Payer: Medicare Other | Admitting: Physical Therapy

## 2021-12-10 LAB — AEROBIC/ANAEROBIC CULTURE W GRAM STAIN (SURGICAL/DEEP WOUND)
Culture: NO GROWTH
Gram Stain: NONE SEEN

## 2021-12-11 ENCOUNTER — Encounter: Payer: Medicare Other | Admitting: Physical Therapy

## 2021-12-13 NOTE — Op Note (Signed)
Hannah Tucker female 81 y.o. 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  ANESTHESIA: General anesthesia with peripheral nerve block  FINDINGS: See below  IMPLANTS: Stratum tibiotalar arthrodesis plate  [id number]:81 y.o. femalesustained a trimalleolar ankle fracture and underwent open treatment several months ago.  Unfortunately she developed collapse of her anterior lateral tibial plafond and nonunion of the medial aspect of the tibia.  She had pain related to the hardware and failure as well.  Given these findings she was indicated for surgery.   Patient understood the risks, benefits and alternatives to surgery which include but are not limited to wound healing complications, infection, nonunion, malunion, need for further surgery as well as damage to surrounding structures. They also understood the potential for continued pain in that there were no guarantees of acceptable outcome After weighing these risks the patient opted to proceed with surgery.  PROCEDURE: Patient was identified in the preoperative holding area.  The left ankle was marked by myself.  Consent was signed by myself and the patient.  Block was performed by anesthesia in the preoperative holding area.  Patient was taken to the operative suite and placed supine on the operative table.  General LMA anesthesia was induced without difficulty. Bump was placed under the  operative hip and bone foam was used.  All bony prominences were well padded.  Tourniquet was placed on the operative thigh.  Preoperative antibiotics were given. The extremity was prepped and draped in the usual sterile fashion and surgical timeout was performed.  The limb was elevated and the tourniquet was inflated to 250 mmHg.  We began by starting with hardware removal.  An incision was made on the medial aspect of the ankle.  This was done at the previously placed incision in the site of the nonunion.  The incision was carried sharply down through skin and subcutaneous tissue.  Blunt dissection was used to gain access to the screws placed on the medial aspect of the tibia.  Once the screw head was identified it was cleared out and the screws removed using a screwdriver uneventfully.  The screw was placed on the back table  Then turned our attention to the lateral ankle.  There were 2 screws that spanned the syndesmosis that were symptomatic.  An incision was made overlying the lateral hardware after fluoroscopy was used to identify the location.  Then the incision was carried sharply down through skin and subcutaneous tissue.  Then blunt dissection was used to gain access to the screw heads.  Then the screws were backed out uneventfully using a screwdriver.  The screws were then placed on the back table and ultimately discarded.  We then turned our attention to the tibia.  An incision was made in the central aspect of the distal leg between the tibialis anterior and extensor hallucis longus tendons.  This was taken sharply down through skin and subcutaneous tissue.  Blunt dissection was used to mobilize skin flaps.  Then the extensor retinaculum was incised in line with the incision.  The tibialis anterior tendon was retracted and the  neurovascular bundle was identified below the extensor hallucis longest tendon and mobilize.  We are able to dissected down to the anterior tibia and the previously placed  hardware.  The hardware was at the distal lateral aspect of the tibia.  Once the hardware was fully exposed the screws and plate were removed.  Then further dissection was carried down along the anterior aspect of the tibia and the ankle joint.  We are able to gain access to the ankle joint and once we are able to see in there we noted significant amount of arthrosis of the joint.  Over on the medial side of the tibia there was obvious nonunion of the medial aspect of the tibia and bony fragments were loose.  We proceeded to repair the nonunion.  There is significant fibrous tissue between the ununited portion of the tibia.  This was removed using a rongeur and a curette.  The fibrous tissue was discarded.  A curette was used to obtain good bleeding bony surfaces on the 2 ends of the nonunion.  We then turned our attention to the ankle joint.  There was some remaining cartilage within the ankle joint on the distal aspect of the tibia and along the dorsal aspect of the talus.  Using a curette and around to her there remaining cartilage was removed.  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  This was removed using a curette and a rongeur as well.  We are able to obtain autograft from the distal aspect of the tibia.  Curette was used to obtain autograft that was healthy and viable from the distal aspect of the tibia for repair of the nonunion.  The autograft was placed in the cup and curette was used to morcellized the autograft material.  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  Then the tibiotalar joint was reduced under direct visualization and held provisionally with K wire fixation.  A strata tibiotalar compression plate was placed uneventfully and good compression was obtained.  We then proceeded to fixate the site of nonunion.  We then  placed the autograft material within the site of the nonunion.The ununited portion of the tibia was then reduced under direct visualization and held provisionally with pointed reduction clamp.  Then 5.0 mm cannulated screws were placed across the nonunion site and good compression was obtained at the nonunion site.  Then the wounds were irrigated with normal saline.  All the wounds were closed in a layered fashion with the deep retinacular tissue closed with a 2-0 Vicryl and the rest closed with 3-0 Monocryl and 3-0 nylon suture.  Tourniquet was released.  Soft dressing and short leg splint was placed.  As awakened from anesthesia and taken recovery in stable condition.  No complications.  POST OPERATIVE INSTRUCTIONS: Nonweightbearing to operative a 70 Keep splint intact and dry. Follow-up in 2 weeks for splint removal, nonweightbearing x-rays and placement of short leg cast  TOURNIQUET TIME:Less than 2 hours  BLOOD LOSS:  less than 50 mL         DRAINS: none         SPECIMEN: none       COMPLICATIONS:  * No complications entered in OR log *         Disposition: PACU - hemodynamically stable.  Condition: stable

## 2021-12-16 ENCOUNTER — Encounter: Payer: Medicare Other | Admitting: Physical Therapy

## 2021-12-18 ENCOUNTER — Encounter: Payer: Medicare Other | Admitting: Physical Therapy

## 2021-12-23 ENCOUNTER — Encounter: Payer: Medicare Other | Admitting: Physical Therapy

## 2021-12-25 ENCOUNTER — Encounter: Payer: Medicare Other | Admitting: Physical Therapy

## 2021-12-30 ENCOUNTER — Encounter: Payer: Medicare Other | Admitting: Physical Therapy

## 2022-01-01 ENCOUNTER — Encounter: Payer: Medicare Other | Admitting: Physical Therapy

## 2022-01-06 ENCOUNTER — Encounter: Payer: Medicare Other | Admitting: Physical Therapy

## 2022-01-08 ENCOUNTER — Encounter: Payer: Medicare Other | Admitting: Physical Therapy

## 2022-01-13 ENCOUNTER — Encounter: Payer: Medicare Other | Admitting: Physical Therapy

## 2022-01-15 ENCOUNTER — Encounter: Payer: Medicare Other | Admitting: Physical Therapy

## 2022-01-20 ENCOUNTER — Encounter: Payer: Medicare Other | Admitting: Physical Therapy

## 2022-01-22 ENCOUNTER — Encounter: Payer: Medicare Other | Admitting: Physical Therapy

## 2022-01-27 ENCOUNTER — Ambulatory Visit: Payer: Medicare Other | Attending: Orthopaedic Surgery

## 2022-01-27 DIAGNOSIS — R262 Difficulty in walking, not elsewhere classified: Secondary | ICD-10-CM | POA: Diagnosis present

## 2022-01-27 DIAGNOSIS — M6281 Muscle weakness (generalized): Secondary | ICD-10-CM | POA: Insufficient documentation

## 2022-01-27 DIAGNOSIS — R269 Unspecified abnormalities of gait and mobility: Secondary | ICD-10-CM | POA: Diagnosis present

## 2022-01-27 DIAGNOSIS — M25672 Stiffness of left ankle, not elsewhere classified: Secondary | ICD-10-CM | POA: Diagnosis present

## 2022-01-27 DIAGNOSIS — R52 Pain, unspecified: Secondary | ICD-10-CM | POA: Insufficient documentation

## 2022-01-27 DIAGNOSIS — M25572 Pain in left ankle and joints of left foot: Secondary | ICD-10-CM | POA: Diagnosis present

## 2022-01-27 NOTE — Therapy (Signed)
OUTPATIENT PHYSICAL THERAPY LOWER EXTREMITY EVALUATION   Patient Name: Hannah Tucker MRN: [id number] DOB:14-Mar-1940, 81 y.o., female Today's Date: 01/27/2022  END OF SESSION:  PT End of Session - 01/27/22 1200     Visit Number 1    Number of Visits 24    Date for PT Re-Evaluation 04/21/22    Authorization Type Medicare A&B Primary    PT Start Time 1145    PT Stop Time 1230    PT Time Calculation (min) 45 min    Activity Tolerance Patient tolerated treatment well;No increased pain    Behavior During Therapy WFL for tasks assessed/performed             Past Medical History:  Diagnosis Date   Arthritis    Complication of anesthesia    nausea   Dyspnea    with exertion   Family history of adverse reaction to anesthesia    PONV- MOM   Hypertension    Hypothyroidism    PONV (postoperative nausea and vomiting)    Sleep apnea    uses CPAP nightly   Stress incontinence    Valvular insufficiency, mitral    Past Surgical History:  Procedure Laterality Date   ANKLE ARTHROSCOPY WITH ARTHRODESIS Left 12/05/2021   Procedure: REPAIR OF TIBIAL NONUNION WITH AUTOGRAFT, TIBIOTALAR ARTHRODESIS;  Surgeon: Terance Hart, MD;  Location: [facility] OR;  Service: Orthopedics;  Laterality: Left;   APPLICATION OF WOUND VAC Right 01/04/2021   Procedure: APPLICATION OF WOUND VAC;  Surgeon: Myrene Galas, MD;  Location: [facility] OR;  Service: Orthopedics;  Laterality: Right;   BREAST CYST ASPIRATION Bilateral    neg   BREAST EXCISIONAL BIOPSY Right    1970's   CATARACT EXTRACTION W/ INTRAOCULAR LENS  IMPLANT, BILATERAL Bilateral    COLONOSCOPY     DILATION AND CURETTAGE OF UTERUS     HARDWARE REMOVAL Left 12/05/2021   Procedure: LEFT ANKLE DEEP HARDWARE REMOVAL, MEDIAL AND LATERAL;  Surgeon: Terance Hart, MD;  Location: [facility] OR;  Service: Orthopedics;  Laterality: Left;  LENGTH OF SURGERY: 150 MINUTES   I & D EXTREMITY Left 01/04/2021   Procedure: IRRIGATION AND DEBRIDEMENT LEFT ANKLE;   Surgeon: Myrene Galas, MD;  Location: [facility] OR;  Service: Orthopedics;  Laterality: Left;   I & D EXTREMITY Left 01/07/2021   Procedure: IRRIGATION AND DEBRIDEMENT EXTREMITY, Left ankle;  Surgeon: Myrene Galas, MD;  Location: [facility] OR;  Service: Orthopedics;  Laterality: Left;   KNEE ARTHROPLASTY Left 11/24/2016   Procedure: COMPUTER ASSISTED TOTAL KNEE ARTHROPLASTY;  Surgeon: Donato Heinz, MD;  Location: [facility] ORS;  Service: Orthopedics;  Laterality: Left;   KNEE ARTHROPLASTY Right 12/31/2020   Procedure: COMPUTER ASSISTED TOTAL KNEE ARTHROPLASTY;  Surgeon: Donato Heinz, MD;  Location: [facility] OR[facility];  Service: Orthopedics;  Laterality: Right;   KNEE ARTHROSCOPY Left    KNEE ARTHROSCOPY Left 07/18/2015   Procedure: ARTHROSCOPY KNEE WITH MEDIAL COMPARTMENTAL MENICUS CHONDROPLASTY;  Surgeon: Donato Heinz, MD;  Location: [facility] OR[facility];  Service: Orthopedics;  Laterality: Left;   KNEE ARTHROSCOPY WITH LATERAL MENISECTOMY Left 07/18/2015   Procedure: LEFT KNEE ARTHROSCOPY WITH PARTIAL LATERAL MENISECTOMY GRADE 4 CHONDROMYLASIA LATERAL TIBIAL PLATEAU;  Surgeon: Donato Heinz, MD;  Location: [facility] OR[facility];  Service: Orthopedics;  Laterality: Left;   ORIF ANKLE FRACTURE Left 01/04/2021   Procedure: OPEN REDUCTION INTERNAL FIXATION (ORIF) ANKLE FRACTURE;  Surgeon: Myrene Galas, MD;  Location: [facility] OR;  Service: Orthopedics;  Laterality: Left;   ORIF ANKLE FRACTURE Left 01/07/2021  Procedure: SCREW REMOVAL ANKLE REMOVAL OF WOUND VAC LEFT LEG;  Surgeon: Myrene Galas, MD;  Location: [facility] OR;  Service: Orthopedics;  Laterality: Left;   Patient Active Problem List   Diagnosis Date Noted   Arthritis of left ankle 12/05/2021   Sleep disturbance    Acute blood loss anemia 01/18/2021   Hypokalemia 01/18/2021   Constipation 01/18/2021   Trauma 01/08/2021   Open left ankle fracture, sequela 01/03/2021   Essential hypertension 01/03/2021   Hypothyroidism 01/03/2021   Total knee replacement status 12/31/2020   Family  history of heart disease 04/12/2018   History of total knee arthroplasty, left 11/24/2016   Adult idiopathic generalized osteoporosis 03/01/2015   OSA on CPAP 01/30/2014    PCP: Bethann Punches, MD  REFERRING PROVIDER: Terance Hart, MD  REFERRING DIAG: Left 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  She is still at non-weightbearing until the end of this week, and she is performing well with the knee scooter.  Pt does note more pain in the L knee from being on the scooter than the ankle.  Pt also reports she has not tried a standard toilet yet, as she has an elevated commode beside the bed.   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 PMH includes HTN, valvular insufficiency, SUI.    PAIN:  Are you having pain? Yes: NPRS scale: 3/10 Pain location: 3/10 in the L knee and 2/10 in the L ankle Pain description: soreness Aggravating factors: Being on the knee scooter is more problematic on the surgically fixed knee.  PRECAUTIONS: Other: NWB on L LE for first 8 weeks (01/30/22)  WEIGHT BEARING RESTRICTIONS: Yes Non-weight bearing until the end of this week, 01/30/22  FALLS:  Has patient fallen in last 6 months? No  LIVING ENVIRONMENT: Lives with: lives with their  spouse Lives in: House/apartment Stairs: Yes: Internal: 15 steps; on left going up and External: 4 steps; bilateral but cannot reach both Has following equipment at home: Single point cane, Walker - 2 wheeled, Environmental consultant - 4 wheeled, Crutches, Tour manager, bed side commode, and Knee Scooter  OCCUPATION: Retired  PLOF: Independent  PATIENT GOALS: Pt wants to be able to go up/down the flight of steps.  NEXT MD VISIT: 02/26/22  OBJECTIVE:   DIAGNOSTIC FINDINGS:   CLINICAL DATA:  Fluoroscopic assistance for revision of internal fixation in left ankle   EXAM: LEFT ANKLE COMPLETE - 3+ VIEW   COMPARISON:  CT done on 09/24/2021   FINDINGS: There is interval placement of a new longer screw transfixing the medial malleolus. There is a new metallic plate along the anterior margin of distal tibia anchored to tibia and talus. Fluoroscopic time 1 minute and 11 seconds. Radiation dose 1.46 mGy.   IMPRESSION: Fluoroscopic assistance was provided for revision of internal fixation in left ankle.  PATIENT SURVEYS:  FOTO 40/50  COGNITION: Overall cognitive status: Within functional limits for tasks assessed     SENSATION: WFL  PALPATION: No tenderness surrounding the ankle  LOWER EXTREMITY ROM:  Active ROM Right eval Left eval  Hip flexion    Hip extension    Hip abduction    Hip adduction    Hip internal rotation    Hip external rotation    Knee flexion    Knee extension    Ankle dorsiflexion  2 deg  Ankle plantarflexion  13 deg  Ankle inversion  7 deg  Ankle eversion  3 deg   (Blank rows = not tested)  LOWER EXTREMITY MMT:  MMT Right eval Left eval  Hip flexion    Hip extension    Hip abduction    Hip adduction    Hip internal rotation    Hip external rotation    Knee flexion    Knee extension    Ankle dorsiflexion    Ankle plantarflexion    Ankle inversion    Ankle eversion     (Blank rows = not tested)  LOWER EXTREMITY SPECIAL TESTS:  Ankle special  tests: Not performed at this time  FUNCTIONAL TESTS:  5 times sit to stand: Not performed due to weightbearing restrictions. Timed up and go (TUG): Not performed due to weightbearing restrictions. 10 meter walk test: Not performed due to weightbearing restrictions. Dynamic Gait Index: Not performed due to weightbearing restrictions.  GAIT: Distance walked: Not performed due to weightbearing restrictions.    TODAY'S TREATMENT:                                                                                                                              DATE: 01/27/22   EVAL only    PATIENT EDUCATION:  Education details: Pt educated on role of PT and services provided during current POC, along with prognosis and information about the clinic.  Person educated: Patient and Spouse Education method: Explanation Education comprehension: verbalized understanding  HOME EXERCISE PROGRAM:  Access Code: [email or web address] URL: [email or web address] Date: 01/27/2022 Prepared by: Tomasa HoseJosh Jazmina Muhlenkamp  Exercises - Towel Scrunches  - 1 x daily - 7 x weekly - 3 sets - 10 reps - Seated Heel Raise  - 1 x daily - 7 x weekly - 3 sets - 10 reps - Seated Long Arc Quad  - 1 x daily - 7 x weekly - 3 sets - 10 reps - Ankle Inversion Eversion Towel Slide  - 1 x daily - 7 x weekly - 3 sets - 10 reps - Arch Lifting  - 1 x daily - 7 x weekly - 3 sets - 10 reps   ASSESSMENT:  CLINICAL IMPRESSION: Patient is a 81 y.o. female who was seen today for physical therapy evaluation and treatment for L ankle ROM and pain following Left tibiotalar arthrodesis with repair of tibial nonunion and hardware removal.  Pt presents with physical impairments of  decreased activity tolerance, decreased ROM of L ankle,  increased pain in L ankle, and decreased strength in L LE s/p surgical intervention as noted.  Pt will benefit from skilled therapy to address tolerance, ROM, pain, and strength impairments necessary for  improvement in quality of life.  Pt goals are to be able to ascend/descend stairs again.  Pt currently having difficulty performing this task due to the weight-bearing restrictions that should be lifted by the end of this week.  Pt, therapist and spouse are all in agreement with current goals and therapy procedures as part of POC.    OBJECTIVE IMPAIRMENTS: Abnormal gait, decreased balance, decreased mobility, difficulty walking, decreased ROM, decreased strength, hypomobility, impaired flexibility, and pain.   ACTIVITY LIMITATIONS: standing, squatting, stairs, transfers, bed mobility, bathing, dressing, and locomotion level  PARTICIPATION LIMITATIONS: meal prep, cleaning, laundry, driving, shopping, community activity, yard work, and church  PERSONAL FACTORS: Age, Past/current experiences, Time since onset of injury/illness/exacerbation, and 3+ comorbidities: arthritis, HTN, generalized osteoporosis  are also affecting patient's functional outcome.   REHAB POTENTIAL: Good  CLINICAL DECISION MAKING: Stable/uncomplicated  EVALUATION COMPLEXITY: Moderate   GOALS: Goals reviewed with patient? Yes  SHORT TERM GOALS: Target date: 02/24/2022    Pt will be independent with HEP in order to demonstrate increased ability to perform tasks related to occupation/hobbies. Baseline: Pt given HEP at initial evaluation Goal status: INITIAL   LONG TERM GOALS: Target date: 04/21/2022  1.  Patient (> 41 years old) will complete five times sit to stand test in < 15 seconds indicating an increased LE strength and improved balance. Baseline: Not performed due to weightbearing restrictions. Goal status: INITIAL  2.  Patient will increase FOTO score to equal to or greater than  50   to demonstrate statistically significant improvement in mobility and quality of life.  Baseline: FOTO: 40 Goal status: INITIAL   3.  Patient will increase Berg Balance score by > 6 points to demonstrate decreased fall risk  during functional activities. Baseline: Not performed due to weightbearing restrictions. Goal status: INITIAL   4.  Patient will reduce timed up and go to <11 seconds to reduce fall risk and demonstrate improved transfer/gait ability. Baseline: Not performed due to weightbearing restrictions. Goal status: INITIAL  5.  Patient will increase 10 meter walk test to >1.1m/s as to improve gait speed for better community ambulation and to reduce fall risk. Baseline: Not performed due to weightbearing restrictions. Goal status: INITIAL  6.  Patient will increase six minute walk test distance to >1000 for progression to community ambulator and improve gait ability Baseline: Not performed due to weightbearing restrictions. Goal status: INITIAL     PLAN:  PT FREQUENCY: 2x/week  PT DURATION: 12 weeks  PLANNED INTERVENTIONS: Therapeutic exercises, Therapeutic activity, Neuromuscular re-education, Balance training, Gait training, Patient/Family education, Self Care, Joint mobilization, Stair training, DME instructions, Aquatic Therapy, Dry Needling, Electrical stimulation, Cryotherapy, Moist heat, scar mobilization, Ultrasound, Parrafin, Fluidotherapy, and Manual therapy  PLAN FOR NEXT SESSION: Assess compliance with HEP, potentially assess weightbearing status prior to pt attempting on her own.  Assess R LE ROM and LE strength.   Nolon Bussing, PT, DPT Physical Therapist- [facility]  01/27/22, 8:12 PM

## 2022-01-29 ENCOUNTER — Ambulatory Visit: Payer: Medicare Other

## 2022-01-29 DIAGNOSIS — R262 Difficulty in walking, not elsewhere classified: Secondary | ICD-10-CM | POA: Diagnosis not present

## 2022-01-29 DIAGNOSIS — M6281 Muscle weakness (generalized): Secondary | ICD-10-CM

## 2022-01-29 DIAGNOSIS — R52 Pain, unspecified: Secondary | ICD-10-CM

## 2022-01-29 DIAGNOSIS — M25672 Stiffness of left ankle, not elsewhere classified: Secondary | ICD-10-CM

## 2022-01-29 DIAGNOSIS — R269 Unspecified abnormalities of gait and mobility: Secondary | ICD-10-CM

## 2022-01-29 DIAGNOSIS — M25572 Pain in left ankle and joints of left foot: Secondary | ICD-10-CM

## 2022-01-29 NOTE — Therapy (Signed)
OUTPATIENT PHYSICAL THERAPY LOWER EXTREMITY TREATMENT   Patient Name: Hannah Tucker MRN: [id number] DOB:Sep 18, 1940, 81 y.o., female Today's Date: 01/29/2022  END OF SESSION:  PT End of Session - 01/29/22 1423     Visit Number 2    Number of Visits 24    Date for PT Re-Evaluation 04/21/22    Authorization Type Medicare A&B Primary    PT Start Time 0940    PT Stop Time 1015    PT Time Calculation (min) 35 min    Activity Tolerance Patient tolerated treatment well;No increased pain    Behavior During Therapy WFL for tasks assessed/performed              Past Medical History:  Diagnosis Date   Arthritis    Complication of anesthesia    nausea   Dyspnea    with exertion   Family history of adverse reaction to anesthesia    PONV- MOM   Hypertension    Hypothyroidism    PONV (postoperative nausea and vomiting)    Sleep apnea    uses CPAP nightly   Stress incontinence    Valvular insufficiency, mitral    Past Surgical History:  Procedure Laterality Date   ANKLE ARTHROSCOPY WITH ARTHRODESIS Left 12/05/2021   Procedure: REPAIR OF TIBIAL NONUNION WITH AUTOGRAFT, TIBIOTALAR ARTHRODESIS;  Surgeon: Terance Hart, MD;  Location: [facility] OR;  Service: Orthopedics;  Laterality: Left;   APPLICATION OF WOUND VAC Right 01/04/2021   Procedure: APPLICATION OF WOUND VAC;  Surgeon: Myrene Galas, MD;  Location: [facility] OR;  Service: Orthopedics;  Laterality: Right;   BREAST CYST ASPIRATION Bilateral    neg   BREAST EXCISIONAL BIOPSY Right    1970's   CATARACT EXTRACTION W/ INTRAOCULAR LENS  IMPLANT, BILATERAL Bilateral    COLONOSCOPY     DILATION AND CURETTAGE OF UTERUS     HARDWARE REMOVAL Left 12/05/2021   Procedure: LEFT ANKLE DEEP HARDWARE REMOVAL, MEDIAL AND LATERAL;  Surgeon: Terance Hart, MD;  Location: [facility] OR;  Service: Orthopedics;  Laterality: Left;  LENGTH OF SURGERY: 150 MINUTES   I & D EXTREMITY Left 01/04/2021   Procedure: IRRIGATION AND DEBRIDEMENT LEFT ANKLE;   Surgeon: Myrene Galas, MD;  Location: [facility];  Service: Orthopedics;  Laterality: Left;   I & D EXTREMITY Left 01/07/2021   Procedure: IRRIGATION AND DEBRIDEMENT EXTREMITY, Left ankle;  Surgeon: Myrene Galas, MD;  Location: [facility] OR;  Service: Orthopedics;  Laterality: Left;   KNEE ARTHROPLASTY Left 11/24/2016   Procedure: COMPUTER ASSISTED TOTAL KNEE ARTHROPLASTY;  Surgeon: Donato Heinz, MD;  Location: [facility] ORS;  Service: Orthopedics;  Laterality: Left;   KNEE ARTHROPLASTY Right 12/31/2020   Procedure: COMPUTER ASSISTED TOTAL KNEE ARTHROPLASTY;  Surgeon: Donato Heinz, MD;  Location: [facility];  Service: Orthopedics;  Laterality: Right;   KNEE ARTHROSCOPY Left    KNEE ARTHROSCOPY Left 07/18/2015   Procedure: ARTHROSCOPY KNEE WITH MEDIAL COMPARTMENTAL MENICUS CHONDROPLASTY;  Surgeon: Donato Heinz, MD;  Location: [facility];  Service: Orthopedics;  Laterality: Left;   KNEE ARTHROSCOPY WITH LATERAL MENISECTOMY Left 07/18/2015   Procedure: LEFT KNEE ARTHROSCOPY WITH PARTIAL LATERAL MENISECTOMY GRADE 4 CHONDROMYLASIA LATERAL TIBIAL PLATEAU;  Surgeon: Donato Heinz, MD;  Location: [facility];  Service: Orthopedics;  Laterality: Left;   ORIF ANKLE FRACTURE Left 01/04/2021   Procedure: OPEN REDUCTION INTERNAL FIXATION (ORIF) ANKLE FRACTURE;  Surgeon: Myrene Galas, MD;  Location: [facility];  Service: Orthopedics;  Laterality: Left;   ORIF ANKLE FRACTURE Left 01/07/2021  Procedure: SCREW REMOVAL ANKLE REMOVAL OF WOUND VAC LEFT LEG;  Surgeon: Myrene Galas, MD;  Location: [facility];  Service: Orthopedics;  Laterality: Left;   Patient Active Problem List   Diagnosis Date Noted   Arthritis of left ankle 12/05/2021   Sleep disturbance    Acute blood loss anemia 01/18/2021   Hypokalemia 01/18/2021   Constipation 01/18/2021   Trauma 01/08/2021   Open left ankle fracture, sequela 01/03/2021   Essential hypertension 01/03/2021   Hypothyroidism 01/03/2021   Total knee replacement status 12/31/2020   Family  history of heart disease 04/12/2018   History of total knee arthroplasty, left 11/24/2016   Adult idiopathic generalized osteoporosis 03/01/2015   OSA on CPAP 01/30/2014    PCP: Bethann Punches, MD  REFERRING PROVIDER: Terance Hart, MD  REFERRING DIAG: Left tibiotalar arthrodesis with repair of tibial nonunion and hardware removal, 12/06/21  THERAPY DIAG:  Difficulty in walking, not elsewhere classified  Muscle weakness (generalized)  Pain in left ankle and joints of left foot  Abnormality of gait and mobility  Stiffness of left ankle, not elsewhere classified  Pain  Rationale for Evaluation and Treatment: Rehabilitation  ONSET DATE: 12/06/21  SUBJECTIVE:   SUBJECTIVE STATEMENT:  Pt reports she has been consistent with her HEP over the past couple of days.  She still notes some difficulty with navigating the ascension of stairs as she is not to place weight throughout the L foot at this time.     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 PMH includes HTN, valvular insufficiency, SUI.    PAIN:  Are you having pain? Yes: NPRS scale: 3/10 Pain location: 3/10 in the L knee and 2/10 in the L ankle Pain description: soreness Aggravating factors: Being on the knee scooter is more problematic on the surgically fixed knee.  PRECAUTIONS: Other: NWB on L LE for first 8 weeks (01/30/22)  WEIGHT BEARING RESTRICTIONS: Yes Non-weight bearing until the end of this week, 01/30/22  FALLS:  Has patient fallen in last 6 months? No  LIVING ENVIRONMENT: Lives with: lives with their spouse Lives in: House/apartment Stairs: Yes: Internal: 15 steps; on left going up and External: 4 steps; bilateral but cannot reach both Has following equipment at home: Single point cane, Walker - 2 wheeled, Environmental consultant - 4  wheeled, Crutches, Tour manager, bed side commode, and Knee Scooter  OCCUPATION: Retired  PLOF: Independent  PATIENT GOALS: Pt wants to be able to go up/down the flight of steps.  NEXT MD VISIT: 02/26/22  OBJECTIVE:   DIAGNOSTIC FINDINGS:   CLINICAL DATA:  Fluoroscopic assistance for revision of internal fixation in left ankle   EXAM: LEFT ANKLE COMPLETE - 3+ VIEW   COMPARISON:  CT done on 09/24/2021   FINDINGS: There is interval placement of a new longer screw transfixing the medial malleolus. There is a new metallic plate along the anterior margin of distal tibia anchored to tibia and talus. Fluoroscopic time 1 minute and 11 seconds. Radiation dose 1.46 mGy.   IMPRESSION: Fluoroscopic assistance was provided for revision of internal fixation in left ankle.  PATIENT SURVEYS:  FOTO 40/50  COGNITION: Overall cognitive status: Within functional limits for tasks assessed     SENSATION: WFL  PALPATION: No tenderness surrounding the ankle  LOWER EXTREMITY ROM:  Active ROM Right eval Left eval  Hip  flexion    Hip extension    Hip abduction    Hip adduction    Hip internal rotation    Hip external rotation    Knee flexion    Knee extension    Ankle dorsiflexion  2 deg  Ankle plantarflexion  13 deg  Ankle inversion  7 deg  Ankle eversion  3 deg   (Blank rows = not tested)  LOWER EXTREMITY MMT:  MMT Right eval Left eval  Hip flexion    Hip extension    Hip abduction    Hip adduction    Hip internal rotation    Hip external rotation    Knee flexion    Knee extension    Ankle dorsiflexion    Ankle plantarflexion    Ankle inversion    Ankle eversion     (Blank rows = not tested)  LOWER EXTREMITY SPECIAL TESTS:  Ankle special tests: Not performed at this time  FUNCTIONAL TESTS:  5 times sit to stand: Not performed due to weightbearing restrictions. Timed up and go (TUG): Not performed due to weightbearing restrictions. 10 meter walk test: Not  performed due to weightbearing restrictions. Dynamic Gait Index: Not performed due to weightbearing restrictions.  GAIT: Distance walked: Not performed due to weightbearing restrictions.    TODAY'S TREATMENT:   DATE: 01/29/22  Manual:  STM applied to LE with focus placed on anterior tibialis, gastroc/soleus complex, and plantar fascia.  Pt with noticeable TP's noted in all musculature and pain in the plantar fascia and anterior tib Long sitting distal tibifibular AP mobilizations, Grades II-III, for pain modulation and improved ankle ROM Long sitting metatarsal fan mobilization, Grades III, to improve ROM of the metatarsals Long sitting subtalar medial/lateral glides, Grades II-III, for pain modulation and improved ankle ROM    PATIENT EDUCATION:  Education details: Pt educated on role of PT and services provided during current POC, along with prognosis and information about the clinic.  Person educated: Patient and Spouse Education method: Explanation Education comprehension: verbalized understanding  HOME EXERCISE PROGRAM:  Access Code: [email or web address] URL: [email or web address] Date: 01/27/2022 Prepared by: Tomasa HoseJosh Kadance Mccuistion  Exercises - Towel Scrunches  - 1 x daily - 7 x weekly - 3 sets - 10 reps - Seated Heel Raise  - 1 x daily - 7 x weekly - 3 sets - 10 reps - Seated Long Arc Quad  - 1 x daily - 7 x weekly - 3 sets - 10 reps - Ankle Inversion Eversion Towel Slide  - 1 x daily - 7 x weekly - 3 sets - 10 reps - Arch Lifting  - 1 x daily - 7 x weekly - 3 sets - 10 reps   ASSESSMENT:  CLINICAL IMPRESSION:  Pt responded well to manual therapy today.  Session was limited due to pt's late arrival due to 63 being shut down on their way in and having to take a lengthy detour.  Pt has considerable tightness in the plantar fascia and anterior tibialis which was targeted during therapy session.  Mobilization of the digits of the L foot also revealed significnat hypomobility  of the 1st and 2nd digits compared to 3rd-5th.  Pt would benefit from continued therapy to address remaining deficits and will likely be able to tolerate weightbearing during next session and subsequent sessions.   OBJECTIVE IMPAIRMENTS: Abnormal gait, decreased balance, decreased mobility, difficulty walking, decreased ROM, decreased strength, hypomobility, impaired flexibility, and pain.   ACTIVITY LIMITATIONS: standing, squatting, stairs, transfers, bed mobility, bathing,  dressing, and locomotion level  PARTICIPATION LIMITATIONS: meal prep, cleaning, laundry, driving, shopping, community activity, yard work, and church  PERSONAL FACTORS: Age, Past/current experiences, Time since onset of injury/illness/exacerbation, and 3+ comorbidities: arthritis, HTN, generalized osteoporosis  are also affecting patient's functional outcome.   REHAB POTENTIAL: Good  CLINICAL DECISION MAKING: Stable/uncomplicated  EVALUATION COMPLEXITY: Moderate   GOALS: Goals reviewed with patient? Yes  SHORT TERM GOALS: Target date: 02/24/2022    Pt will be independent with HEP in order to demonstrate increased ability to perform tasks related to occupation/hobbies. Baseline: Pt given HEP at initial evaluation Goal status: INITIAL   LONG TERM GOALS: Target date: 04/21/2022  1.  Patient (> 32 years old) will complete five times sit to stand test in < 15 seconds indicating an increased LE strength and improved balance. Baseline: Not performed due to weightbearing restrictions. Goal status: INITIAL  2.  Patient will increase FOTO score to equal to or greater than  50   to demonstrate statistically significant improvement in mobility and quality of life.  Baseline: FOTO: 40 Goal status: INITIAL   3.  Patient will increase Berg Balance score by > 6 points to demonstrate decreased fall risk during functional activities. Baseline: Not performed due to weightbearing restrictions. Goal status: INITIAL   4.   Patient will reduce timed up and go to <11 seconds to reduce fall risk and demonstrate improved transfer/gait ability. Baseline: Not performed due to weightbearing restrictions. Goal status: INITIAL  5.  Patient will increase 10 meter walk test to >1.93m/s as to improve gait speed for better community ambulation and to reduce fall risk. Baseline: Not performed due to weightbearing restrictions. Goal status: INITIAL  6.  Patient will increase six minute walk test distance to >1000 for progression to community ambulator and improve gait ability Baseline: Not performed due to weightbearing restrictions. Goal status: INITIAL     PLAN:  PT FREQUENCY: 2x/week  PT DURATION: 12 weeks  PLANNED INTERVENTIONS: Therapeutic exercises, Therapeutic activity, Neuromuscular re-education, Balance training, Gait training, Patient/Family education, Self Care, Joint mobilization, Stair training, DME instructions, Aquatic Therapy, Dry Needling, Electrical stimulation, Cryotherapy, Moist heat, scar mobilization, Ultrasound, Parrafin, Fluidotherapy, and Manual therapy  PLAN FOR NEXT SESSION:   Weightbearing! Assess compliance with HEP, potentially assess weightbearing status prior to pt attempting on her own.  Assess R LE ROM and LE strength.   Nolon Bussing, PT, DPT Physical Therapist- [facility]  01/29/22, 3:28 PM

## 2022-02-03 ENCOUNTER — Ambulatory Visit: Payer: Medicare Other | Attending: Orthopaedic Surgery

## 2022-02-03 DIAGNOSIS — R262 Difficulty in walking, not elsewhere classified: Secondary | ICD-10-CM | POA: Insufficient documentation

## 2022-02-03 DIAGNOSIS — R269 Unspecified abnormalities of gait and mobility: Secondary | ICD-10-CM | POA: Diagnosis present

## 2022-02-03 DIAGNOSIS — R52 Pain, unspecified: Secondary | ICD-10-CM | POA: Insufficient documentation

## 2022-02-03 DIAGNOSIS — M25672 Stiffness of left ankle, not elsewhere classified: Secondary | ICD-10-CM | POA: Insufficient documentation

## 2022-02-03 DIAGNOSIS — M6281 Muscle weakness (generalized): Secondary | ICD-10-CM | POA: Insufficient documentation

## 2022-02-03 DIAGNOSIS — M25572 Pain in left ankle and joints of left foot: Secondary | ICD-10-CM | POA: Diagnosis present

## 2022-02-03 NOTE — Therapy (Signed)
OUTPATIENT PHYSICAL THERAPY LOWER EXTREMITY TREATMENT   Patient Name: Hannah Tucker MRN: [id number] DOB:10/03/1940, 81 y.o., female Today's Date: 02/03/2022  END OF SESSION:  PT End of Session - 02/03/22 1319     Visit Number 3    Number of Visits 24    Date for PT Re-Evaluation 04/21/22    Authorization Type Medicare A&B Primary    PT Start Time 1315    PT Stop Time 1345    PT Time Calculation (min) 30 min    Activity Tolerance Patient tolerated treatment well;No increased pain    Behavior During Therapy WFL for tasks assessed/performed               Past Medical History:  Diagnosis Date   Arthritis    Complication of anesthesia    nausea   Dyspnea    with exertion   Family history of adverse reaction to anesthesia    PONV- MOM   Hypertension    Hypothyroidism    PONV (postoperative nausea and vomiting)    Sleep apnea    uses CPAP nightly   Stress incontinence    Valvular insufficiency, mitral    Past Surgical History:  Procedure Laterality Date   ANKLE ARTHROSCOPY WITH ARTHRODESIS Left 12/05/2021   Procedure: REPAIR OF TIBIAL NONUNION WITH AUTOGRAFT, TIBIOTALAR ARTHRODESIS;  Surgeon: Terance HartAdair, Christopher R, MD;  Location: [facility];  Service: Orthopedics;  Laterality: Left;   APPLICATION OF WOUND VAC Right 01/04/2021   Procedure: APPLICATION OF WOUND VAC;  Surgeon: Myrene GalasHandy, Michael, MD;  Location: [facility];  Service: Orthopedics;  Laterality: Right;   BREAST CYST ASPIRATION Bilateral    neg   BREAST EXCISIONAL BIOPSY Right    1970's   CATARACT EXTRACTION W/ INTRAOCULAR LENS  IMPLANT, BILATERAL Bilateral    COLONOSCOPY     DILATION AND CURETTAGE OF UTERUS     HARDWARE REMOVAL Left 12/05/2021   Procedure: LEFT ANKLE DEEP HARDWARE REMOVAL, MEDIAL AND LATERAL;  Surgeon: Terance HartAdair, Christopher R, MD;  Location: [facility];  Service: Orthopedics;  Laterality: Left;  LENGTH OF SURGERY: 150 MINUTES   I & D EXTREMITY Left 01/04/2021   Procedure: IRRIGATION AND DEBRIDEMENT LEFT ANKLE;   Surgeon: Myrene GalasHandy, Michael, MD;  Location: [facility];  Service: Orthopedics;  Laterality: Left;   I & D EXTREMITY Left 01/07/2021   Procedure: IRRIGATION AND DEBRIDEMENT EXTREMITY, Left ankle;  Surgeon: Myrene GalasHandy, Michael, MD;  [facility];  Service: Orthopedics;  Laterality: Left;   KNEE ARTHROPLASTY Left 11/24/2016   Procedure: COMPUTER ASSISTED TOTAL KNEE ARTHROPLASTY;  Surgeon: Donato HeinzHooten, James P, MD;  Location: [facility];  Service: Orthopedics;  Laterality: Left;   KNEE ARTHROPLASTY Right 12/31/2020   Procedure: COMPUTER ASSISTED TOTAL KNEE ARTHROPLASTY;  Surgeon: Donato HeinzHooten, James P, MD;  Location: [facility];  Service: Orthopedics;  Laterality: Right;   KNEE ARTHROSCOPY Left    KNEE ARTHROSCOPY Left 07/18/2015   Procedure: ARTHROSCOPY KNEE WITH MEDIAL COMPARTMENTAL MENICUS CHONDROPLASTY;  Surgeon: Donato HeinzJames P Hooten, MD;  Location: [facility];  Service: Orthopedics;  Laterality: Left;   KNEE ARTHROSCOPY WITH LATERAL MENISECTOMY Left 07/18/2015   Procedure: LEFT KNEE ARTHROSCOPY WITH PARTIAL LATERAL MENISECTOMY GRADE 4 CHONDROMYLASIA LATERAL TIBIAL PLATEAU;  Surgeon: Donato HeinzJames P Hooten, MD;  Location: [facility];  Service: Orthopedics;  Laterality: Left;   ORIF ANKLE FRACTURE Left 01/04/2021   Procedure: OPEN REDUCTION INTERNAL FIXATION (ORIF) ANKLE FRACTURE;  Surgeon: Myrene GalasHandy, Michael, MD;  Location: [facility];  Service: Orthopedics;  Laterality: Left;   ORIF ANKLE FRACTURE Left  01/07/2021   Procedure: SCREW REMOVAL ANKLE REMOVAL OF WOUND VAC LEFT LEG;  Surgeon: Myrene Galas, MD;  Location: [facility];  Service: Orthopedics;  Laterality: Left;   Patient Active Problem List   Diagnosis Date Noted   Arthritis of left ankle 12/05/2021   Sleep disturbance    Acute blood loss anemia 01/18/2021   Hypokalemia 01/18/2021   Constipation 01/18/2021   Trauma 01/08/2021   Open left ankle fracture, sequela 01/03/2021   Essential hypertension 01/03/2021   Hypothyroidism 01/03/2021   Total knee replacement status 12/31/2020   Family  history of heart disease 04/12/2018   History of total knee arthroplasty, left 11/24/2016   Adult idiopathic generalized osteoporosis 03/01/2015   OSA on CPAP 01/30/2014    PCP: Bethann Punches, MD  REFERRING PROVIDER: Terance Hart, MD  REFERRING DIAG: Left tibiotalar arthrodesis with repair of tibial nonunion and hardware removal, 12/06/21  THERAPY DIAG:  Difficulty in walking, not elsewhere classified  Muscle weakness (generalized)  Pain in left ankle and joints of left foot  Abnormality of gait and mobility  Stiffness of left ankle, not elsewhere classified  Pain  Rationale for Evaluation and Treatment: Rehabilitation  ONSET DATE: 12/06/21  SUBJECTIVE:   SUBJECTIVE STATEMENT: Pt and husband reports that they had a good time at the [employer] party that they went to over the weekend.  Pt notes she was able to attempt weight-bearing and did well with it.  Pt notes she was able to tolerate going up 3 steps without much difficulty, just uncomfortable with the boot.   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 PMH includes HTN, valvular insufficiency, SUI.    PAIN:  Are you having pain? Yes: NPRS scale: 3/10 Pain location: 3/10 in the L knee and 2/10 in the L ankle Pain description: soreness Aggravating factors: Being on the knee scooter is more problematic on the surgically fixed knee.  PRECAUTIONS: Other: NWB on L LE for first 8 weeks (01/30/22)  WEIGHT BEARING RESTRICTIONS: Yes Non-weight bearing until the end of this week, 01/30/22  FALLS:  Has patient fallen in last 6 months? No  LIVING ENVIRONMENT: Lives with: lives with their spouse Lives in: House/apartment Stairs: Yes: Internal: 15 steps; on left going up and External: 4 steps; bilateral but cannot reach both Has following  equipment at home: Single point cane, Walker - 2 wheeled, Environmental consultant - 4 wheeled, Crutches, Tour manager, bed side commode, and Knee Scooter  OCCUPATION: Retired  PLOF: Independent  PATIENT GOALS: Pt wants to be able to go up/down the flight of steps.  NEXT MD VISIT: 02/26/22  OBJECTIVE:   DIAGNOSTIC FINDINGS:   CLINICAL DATA:  Fluoroscopic assistance for revision of internal fixation in left ankle   EXAM: LEFT ANKLE COMPLETE - 3+ VIEW   COMPARISON:  CT done on 09/24/2021   FINDINGS: There is interval placement of a new longer screw transfixing the medial malleolus. There is a new metallic plate along the anterior margin of distal tibia anchored to tibia and talus. Fluoroscopic time 1 minute and 11 seconds. Radiation dose 1.46 mGy.   IMPRESSION: Fluoroscopic assistance was provided for revision of internal fixation in left ankle.  PATIENT SURVEYS:  FOTO 40/50  COGNITION: Overall cognitive status: Within functional limits for tasks assessed     SENSATION: WFL  PALPATION: No tenderness surrounding  the ankle  LOWER EXTREMITY ROM:  Active ROM Right eval Left eval  Hip flexion    Hip extension    Hip abduction    Hip adduction    Hip internal rotation    Hip external rotation    Knee flexion    Knee extension    Ankle dorsiflexion  2 deg  Ankle plantarflexion  13 deg  Ankle inversion  7 deg  Ankle eversion  3 deg   (Blank rows = not tested)  LOWER EXTREMITY MMT:  MMT Right eval Left eval  Hip flexion    Hip extension    Hip abduction    Hip adduction    Hip internal rotation    Hip external rotation    Knee flexion    Knee extension    Ankle dorsiflexion    Ankle plantarflexion    Ankle inversion    Ankle eversion     (Blank rows = not tested)  LOWER EXTREMITY SPECIAL TESTS:  Ankle special tests: Not performed at this time  FUNCTIONAL TESTS:  5 times sit to stand: Not performed due to weightbearing restrictions. Timed up and go (TUG): Not  performed due to weightbearing restrictions. 10 meter walk test: Not performed due to weightbearing restrictions. Dynamic Gait Index: Not performed due to weightbearing restrictions.  GAIT: Distance walked: Not performed due to weightbearing restrictions.    TODAY'S TREATMENT:   DATE: 02/03/22   Manual:  STM applied to LE with focus placed on anterior tibialis, gastroc/soleus complex, and plantar fascia.  Pt with noticeable TP's noted in all musculature and pain in the plantar fascia and anterior tib Long sitting distal tibifibular AP mobilizations, Grades II-III, for pain modulation and improved ankle ROM Long sitting metatarsal fan mobilization, Grades III, to improve ROM of the metatarsals Long sitting subtalar medial/lateral glides, Grades II-III, for pain modulation and improved ankle ROM   TherEx:  Ambulation within the // bars, moderate assistance through the UE's for support, x8 laps total Ambulation around the gym x2 laps with RW for UE assistance, pt noting some slight discomfort with the second lap, so was discontinued following return Ambulation up/down stairs x3 total with CGA for safety   PATIENT EDUCATION:  Education details: Pt educated on role of PT and services provided during current POC, along with prognosis and information about the clinic.  Person educated: Patient and Spouse Education method: Explanation Education comprehension: verbalized understanding  HOME EXERCISE PROGRAM:  Access Code: [email or web address] URL: [email or web address] Date: 01/27/2022 Prepared by: Tomasa Hose  Exercises - Towel Scrunches  - 1 x daily - 7 x weekly - 3 sets - 10 reps - Seated Heel Raise  - 1 x daily - 7 x weekly - 3 sets - 10 reps - Seated Long Arc Quad  - 1 x daily - 7 x weekly - 3 sets - 10 reps - Ankle Inversion Eversion Towel Slide  - 1 x daily - 7 x weekly - 3 sets - 10 reps - Arch Lifting  - 1 x daily - 7 x weekly - 3 sets - 10  reps   ASSESSMENT:  CLINICAL IMPRESSION:  Pt performed well and put forth great effort throughout the session today.  Pt was able to tolerate ambulation and weightbearing through the L ankle without any difficulty.  Pt did note some discomfort, but no pain with the ambulation around the gym.  She felt like the anterior ankle joint was having increased mobility and stretching, which is the discomfort she  was describing.  Pt to continue with increased weightbearing activities in the boot as tolerable going forward.   Pt will continue to benefit from skilled therapy to address remaining deficits in order to improve overall QoL and return to PLOF.     OBJECTIVE IMPAIRMENTS: Abnormal gait, decreased balance, decreased mobility, difficulty walking, decreased ROM, decreased strength, hypomobility, impaired flexibility, and pain.   ACTIVITY LIMITATIONS: standing, squatting, stairs, transfers, bed mobility, bathing, dressing, and locomotion level  PARTICIPATION LIMITATIONS: meal prep, cleaning, laundry, driving, shopping, community activity, yard work, and church  PERSONAL FACTORS: Age, Past/current experiences, Time since onset of injury/illness/exacerbation, and 3+ comorbidities: arthritis, HTN, generalized osteoporosis  are also affecting patient's functional outcome.   REHAB POTENTIAL: Good  CLINICAL DECISION MAKING: Stable/uncomplicated  EVALUATION COMPLEXITY: Moderate   GOALS: Goals reviewed with patient? Yes  SHORT TERM GOALS: Target date: 02/24/2022    Pt will be independent with HEP in order to demonstrate increased ability to perform tasks related to occupation/hobbies. Baseline: Pt given HEP at initial evaluation Goal status: INITIAL   LONG TERM GOALS: Target date: 04/21/2022  1.  Patient (> 12 years old) will complete five times sit to stand test in < 15 seconds indicating an increased LE strength and improved balance. Baseline: Not performed due to weightbearing  restrictions. Goal status: INITIAL  2.  Patient will increase FOTO score to equal to or greater than  50   to demonstrate statistically significant improvement in mobility and quality of life.  Baseline: FOTO: 40 Goal status: INITIAL   3.  Patient will increase Berg Balance score by > 6 points to demonstrate decreased fall risk during functional activities. Baseline: Not performed due to weightbearing restrictions. Goal status: INITIAL   4.  Patient will reduce timed up and go to <11 seconds to reduce fall risk and demonstrate improved transfer/gait ability. Baseline: Not performed due to weightbearing restrictions. Goal status: INITIAL  5.  Patient will increase 10 meter walk test to >1.59m/s as to improve gait speed for better community ambulation and to reduce fall risk. Baseline: Not performed due to weightbearing restrictions. Goal status: INITIAL  6.  Patient will increase six minute walk test distance to >1000 for progression to community ambulator and improve gait ability Baseline: Not performed due to weightbearing restrictions. Goal status: INITIAL     PLAN:  PT FREQUENCY: 2x/week  PT DURATION: 12 weeks  PLANNED INTERVENTIONS: Therapeutic exercises, Therapeutic activity, Neuromuscular re-education, Balance training, Gait training, Patient/Family education, Self Care, Joint mobilization, Stair training, DME instructions, Aquatic Therapy, Dry Needling, Electrical stimulation, Cryotherapy, Moist heat, scar mobilization, Ultrasound, Parrafin, Fluidotherapy, and Manual therapy  PLAN FOR NEXT SESSION:   Continue with weightbearing assessment. Assess compliance with HEP, potentially assess weightbearing status prior to pt attempting on her own.  Assess R LE ROM and LE strength.   Nolon Bussing, PT, DPT Physical Therapist- [facility]  02/03/22, 3:14 PM

## 2022-02-04 NOTE — Therapy (Incomplete)
OUTPATIENT PHYSICAL THERAPY LOWER EXTREMITY TREATMENT   Patient Name: Hannah Tucker MRN: [id number] DOB:[birth year], 81 y.o., female Today's Date: 02/05/2022  END OF SESSION:  PT End of Session - 02/05/22 1519     Visit Number 4    Number of Visits 24    Date for PT Re-Evaluation 04/21/22    Authorization Type Medicare A&B Primary    PT Start Time 1516    PT Stop Time 1600    PT Time Calculation (min) 44 min    Equipment Utilized During Treatment Gait belt    Activity Tolerance Patient tolerated treatment well;No increased pain    Behavior During Therapy WFL for tasks assessed/performed                Past Medical History:  Diagnosis Date   Arthritis    Complication of anesthesia    nausea   Dyspnea    with exertion   Family history of adverse reaction to anesthesia    PONV- MOM   Hypertension    Hypothyroidism    PONV (postoperative nausea and vomiting)    Sleep apnea    uses CPAP nightly   Stress incontinence    Valvular insufficiency, mitral    Past Surgical History:  Procedure Laterality Date   ANKLE ARTHROSCOPY WITH ARTHRODESIS Left 12/05/2021   Procedure: REPAIR OF TIBIAL NONUNION WITH AUTOGRAFT, TIBIOTALAR ARTHRODESIS;  Surgeon: Terance Hart, MD;  Location: [facility] OR;  Service: Orthopedics;  Laterality: Left;   APPLICATION OF WOUND VAC Right 01/04/2021   Procedure: APPLICATION OF WOUND VAC;  Surgeon: Myrene Galas, MD;  Location: [facility] OR;  Service: Orthopedics;  Laterality: Right;   BREAST CYST ASPIRATION Bilateral    neg   BREAST EXCISIONAL BIOPSY Right    1970's   CATARACT EXTRACTION W/ INTRAOCULAR LENS  IMPLANT, BILATERAL Bilateral    COLONOSCOPY     DILATION AND CURETTAGE OF UTERUS     HARDWARE REMOVAL Left 12/05/2021   Procedure: LEFT ANKLE DEEP HARDWARE REMOVAL, MEDIAL AND LATERAL;  Surgeon: Terance Hart, MD;  Location: [facility] OR;  Service: Orthopedics;  Laterality: Left;  LENGTH OF SURGERY: 150 MINUTES   I & D EXTREMITY Left 01/04/2021    Procedure: IRRIGATION AND DEBRIDEMENT LEFT ANKLE;  Surgeon: Myrene Galas, MD;  Location: [facility] OR;  Service: Orthopedics;  Laterality: Left;   I & D EXTREMITY Left 01/07/2021   Procedure: IRRIGATION AND DEBRIDEMENT EXTREMITY, Left ankle;  Surgeon: Myrene Galas, MD;  Location: [facility] OR;  Service: Orthopedics;  Laterality: Left;   KNEE ARTHROPLASTY Left 11/24/2016   Procedure: COMPUTER ASSISTED TOTAL KNEE ARTHROPLASTY;  Surgeon: Donato Heinz, MD;  Location: [facility] ORS;  Service: Orthopedics;  Laterality: Left;   KNEE ARTHROPLASTY Right 12/31/2020   Procedure: COMPUTER ASSISTED TOTAL KNEE ARTHROPLASTY;  Surgeon: Donato Heinz, MD;  Location: [facility];  Service: Orthopedics;  Laterality: Right;   KNEE ARTHROSCOPY Left    KNEE ARTHROSCOPY Left 07/18/2015   Procedure: ARTHROSCOPY KNEE WITH MEDIAL COMPARTMENTAL MENICUS CHONDROPLASTY;  Surgeon: Donato Heinz, MD;  Location: [facility];  Service: Orthopedics;  Laterality: Left;   KNEE ARTHROSCOPY WITH LATERAL MENISECTOMY Left 07/18/2015   Procedure: LEFT KNEE ARTHROSCOPY WITH PARTIAL LATERAL MENISECTOMY GRADE 4 CHONDROMYLASIA LATERAL TIBIAL PLATEAU;  Surgeon: Donato Heinz, MD;  Location: [facility];  Service: Orthopedics;  Laterality: Left;   ORIF ANKLE FRACTURE Left 01/04/2021   Procedure: OPEN REDUCTION INTERNAL FIXATION (ORIF) ANKLE FRACTURE;  Surgeon: Myrene Galas, MD;  Location: [facility] OR;  Service:  Orthopedics;  Laterality: Left;   ORIF ANKLE FRACTURE Left 01/07/2021   Procedure: SCREW REMOVAL ANKLE REMOVAL OF WOUND VAC LEFT LEG;  Surgeon: Myrene GalasHandy, Michael, MD;  Location: [facility] OR;  Service: Orthopedics;  Laterality: Left;   Patient Active Problem List   Diagnosis Date Noted   Arthritis of left ankle 12/05/2021   Sleep disturbance    Acute blood loss anemia 01/18/2021   Hypokalemia 01/18/2021   Constipation 01/18/2021   Trauma 01/08/2021   Open left ankle fracture, sequela 01/03/2021   Essential hypertension 01/03/2021   Hypothyroidism 01/03/2021    Total knee replacement status 12/31/2020   Family history of heart disease 04/12/2018   History of total knee arthroplasty, left 11/24/2016   Adult idiopathic generalized osteoporosis 03/01/2015   OSA on CPAP 01/30/2014    PCP: Bethann PunchesMiller, Mark, MD  REFERRING PROVIDER: Terance HartAdair, Christopher R, MD  REFERRING DIAG: Left tibiotalar arthrodesis with repair of tibial nonunion and hardware removal, 12/06/21  THERAPY DIAG:  Difficulty in walking, not elsewhere classified  Muscle weakness (generalized)  Pain in left ankle and joints of left foot  Abnormality of gait and mobility  Stiffness of left ankle, not elsewhere classified  Pain  Rationale for Evaluation and Treatment: Rehabilitation  ONSET DATE: 12/06/21  SUBJECTIVE:   SUBJECTIVE STATEMENT:  Pt reports she is in quite a bit of pain now that she is able to ambulate on it. She notes that after the last visit, she may have overdone it due to the pain she experienced with any ambulation.    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 PMH includes HTN, valvular insufficiency, SUI.    PAIN:  Are you having pain? Yes: NPRS scale: 3/10 Pain location: 3/10 in the L knee and 2/10 in the L ankle Pain description: soreness Aggravating factors: Being on the knee scooter is more problematic on the surgically fixed knee.  PRECAUTIONS: Other: NWB on L LE for first 8 weeks (01/30/22)  WEIGHT BEARING RESTRICTIONS: Yes Non-weight bearing until the end of this week, 01/30/22  FALLS:  Has patient fallen in last 6 months? No  LIVING ENVIRONMENT: Lives with: lives with their spouse Lives in: House/apartment Stairs: Yes: Internal: 15 steps; on left going up and External: 4 steps; bilateral but cannot reach both Has following equipment at home: Single point cane,  Walker - 2 wheeled, Environmental consultantWalker - 4 wheeled, Crutches, Tour managerhower bench, bed side commode, and Knee Scooter  OCCUPATION: Retired  PLOF: Independent  PATIENT GOALS: Pt wants to be able to go up/down the flight of steps.  NEXT MD VISIT: 02/26/22  OBJECTIVE:   DIAGNOSTIC FINDINGS:   CLINICAL DATA:  Fluoroscopic assistance for revision of internal fixation in left ankle   EXAM: LEFT ANKLE COMPLETE - 3+ VIEW   COMPARISON:  CT done on 09/24/2021   FINDINGS: There is interval placement of a new longer screw transfixing the medial malleolus. There is a new metallic plate along the anterior margin of distal tibia anchored to tibia and talus. Fluoroscopic time 1 minute and 11 seconds. Radiation dose 1.46 mGy.   IMPRESSION: Fluoroscopic assistance was provided for revision of internal fixation in left ankle.  PATIENT SURVEYS:  FOTO 40/50  COGNITION: Overall cognitive status: Within functional limits for tasks assessed     SENSATION: WFL  PALPATION: No tenderness surrounding the ankle  LOWER  EXTREMITY ROM:  Active ROM Right eval Left eval  Hip flexion    Hip extension    Hip abduction    Hip adduction    Hip internal rotation    Hip external rotation    Knee flexion    Knee extension    Ankle dorsiflexion  2 deg  Ankle plantarflexion  13 deg  Ankle inversion  7 deg  Ankle eversion  3 deg   (Blank rows = not tested)  LOWER EXTREMITY MMT:  MMT Right eval Left eval  Hip flexion    Hip extension    Hip abduction    Hip adduction    Hip internal rotation    Hip external rotation    Knee flexion    Knee extension    Ankle dorsiflexion    Ankle plantarflexion    Ankle inversion    Ankle eversion     (Blank rows = not tested)  LOWER EXTREMITY SPECIAL TESTS:  Ankle special tests: Not performed at this time  FUNCTIONAL TESTS:  5 times sit to stand: Not performed due to weightbearing restrictions. Timed up and go (TUG): Not performed due to weightbearing  restrictions. 10 meter walk test: Not performed due to weightbearing restrictions. Dynamic Gait Index: Not performed due to weightbearing restrictions.  GAIT: Distance walked: Not performed due to weightbearing restrictions.    TODAY'S TREATMENT:   DATE: 02/05/22    Manual:  STM applied to LE with focus placed on anterior tibialis, gastroc/soleus complex, and plantar fascia.  Pt with noticeable TP's noted in all musculature and pain in the plantar fascia and anterior tib Long sitting distal tibifibular AP mobilizations, Grades II-III, for pain modulation and improved ankle ROM Long sitting metatarsal fan mobilization, Grades III, to improve ROM of the metatarsals Long sitting subtalar medial/lateral glides, Grades II-III, for pain modulation and improved ankle ROM Seated IASTM applied to the posterior/lateral calf for increased tissue extensibility Seated IASTM applied to the plantar fascia for increased tissue extensibility and decreased pain in the foot   PATIENT EDUCATION:  Education details: Pt educated on role of PT and services provided during current POC, along with prognosis and information about the clinic.  Person educated: Patient and Spouse Education method: Explanation Education comprehension: verbalized understanding  HOME EXERCISE PROGRAM:  Access Code: [email or web address] URL: [email or web address] Date: 01/27/2022 Prepared by: Tomasa Hose  Exercises - Towel Scrunches  - 1 x daily - 7 x weekly - 3 sets - 10 reps - Seated Heel Raise  - 1 x daily - 7 x weekly - 3 sets - 10 reps - Seated Long Arc Quad  - 1 x daily - 7 x weekly - 3 sets - 10 reps - Ankle Inversion Eversion Towel Slide  - 1 x daily - 7 x weekly - 3 sets - 10 reps - Arch Lifting  - 1 x daily - 7 x weekly - 3 sets - 10 reps   ASSESSMENT:  CLINICAL IMPRESSION:  Manual therapy performed for most of session due to pt feeling as though she overdid it during last treatment session when  ambulation was utilized.  Pt continues to have significant tightness in the plantar fascia and posterior calf musculature.  Pt will continue to monitor pain levels throughout therapy and exercises at home in order to improve overall ROM and reduce pain levels.   Pt will continue to benefit from skilled therapy to address remaining deficits in order to improve overall QoL and return to PLOF.  OBJECTIVE IMPAIRMENTS: Abnormal gait, decreased balance, decreased mobility, difficulty walking, decreased ROM, decreased strength, hypomobility, impaired flexibility, and pain.   ACTIVITY LIMITATIONS: standing, squatting, stairs, transfers, bed mobility, bathing, dressing, and locomotion level  PARTICIPATION LIMITATIONS: meal prep, cleaning, laundry, driving, shopping, community activity, yard work, and church  PERSONAL FACTORS: Age, Past/current experiences, Time since onset of injury/illness/exacerbation, and 3+ comorbidities: arthritis, HTN, generalized osteoporosis  are also affecting patient's functional outcome.   REHAB POTENTIAL: Good  CLINICAL DECISION MAKING: Stable/uncomplicated  EVALUATION COMPLEXITY: Moderate   GOALS: Goals reviewed with patient? Yes  SHORT TERM GOALS: Target date: 02/24/2022    Pt will be independent with HEP in order to demonstrate increased ability to perform tasks related to occupation/hobbies. Baseline: Pt given HEP at initial evaluation Goal status: INITIAL   LONG TERM GOALS: Target date: 04/21/2022  1.  Patient (> 46 years old) will complete five times sit to stand test in < 15 seconds indicating an increased LE strength and improved balance. Baseline: Not performed due to weightbearing restrictions. Goal status: INITIAL  2.  Patient will increase FOTO score to equal to or greater than  50   to demonstrate statistically significant improvement in mobility and quality of life.  Baseline: FOTO: 40 Goal status: INITIAL   3.  Patient will increase Berg  Balance score by > 6 points to demonstrate decreased fall risk during functional activities. Baseline: Not performed due to weightbearing restrictions. Goal status: INITIAL   4.  Patient will reduce timed up and go to <11 seconds to reduce fall risk and demonstrate improved transfer/gait ability. Baseline: Not performed due to weightbearing restrictions. Goal status: INITIAL  5.  Patient will increase 10 meter walk test to >1.32m/s as to improve gait speed for better community ambulation and to reduce fall risk. Baseline: Not performed due to weightbearing restrictions. Goal status: INITIAL  6.  Patient will increase six minute walk test distance to >1000 for progression to community ambulator and improve gait ability Baseline: Not performed due to weightbearing restrictions. Goal status: INITIAL     PLAN:  PT FREQUENCY: 2x/week  PT DURATION: 12 weeks  PLANNED INTERVENTIONS: Therapeutic exercises, Therapeutic activity, Neuromuscular re-education, Balance training, Gait training, Patient/Family education, Self Care, Joint mobilization, Stair training, DME instructions, Aquatic Therapy, Dry Needling, Electrical stimulation, Cryotherapy, Moist heat, scar mobilization, Ultrasound, Parrafin, Fluidotherapy, and Manual therapy  PLAN FOR NEXT SESSION:   Continue with weightbearing if tolerated. Assess compliance with HEP, potentially assess weightbearing status prior to pt attempting on her own.  Assess R LE ROM and LE strength.   Nolon Bussing, PT, DPT Physical Therapist- [facility]  02/05/22, 4:35 PM

## 2022-02-05 ENCOUNTER — Ambulatory Visit: Payer: Medicare Other

## 2022-02-05 DIAGNOSIS — R262 Difficulty in walking, not elsewhere classified: Secondary | ICD-10-CM

## 2022-02-05 DIAGNOSIS — R269 Unspecified abnormalities of gait and mobility: Secondary | ICD-10-CM

## 2022-02-05 DIAGNOSIS — M25672 Stiffness of left ankle, not elsewhere classified: Secondary | ICD-10-CM

## 2022-02-05 DIAGNOSIS — M6281 Muscle weakness (generalized): Secondary | ICD-10-CM

## 2022-02-05 DIAGNOSIS — M25572 Pain in left ankle and joints of left foot: Secondary | ICD-10-CM

## 2022-02-05 DIAGNOSIS — R52 Pain, unspecified: Secondary | ICD-10-CM

## 2022-02-10 ENCOUNTER — Ambulatory Visit: Payer: Medicare Other

## 2022-02-10 DIAGNOSIS — M25672 Stiffness of left ankle, not elsewhere classified: Secondary | ICD-10-CM

## 2022-02-10 DIAGNOSIS — M6281 Muscle weakness (generalized): Secondary | ICD-10-CM

## 2022-02-10 DIAGNOSIS — R52 Pain, unspecified: Secondary | ICD-10-CM

## 2022-02-10 DIAGNOSIS — R269 Unspecified abnormalities of gait and mobility: Secondary | ICD-10-CM

## 2022-02-10 DIAGNOSIS — R262 Difficulty in walking, not elsewhere classified: Secondary | ICD-10-CM

## 2022-02-10 DIAGNOSIS — M25572 Pain in left ankle and joints of left foot: Secondary | ICD-10-CM

## 2022-02-10 NOTE — Therapy (Signed)
OUTPATIENT PHYSICAL THERAPY LOWER EXTREMITY TREATMENT   Patient Name: Hannah Tucker MRN: [id number] DOB:[birth year], 81 y.o., female Today's Date: 02/10/2022  END OF SESSION:  PT End of Session - 02/10/22 1358     Visit Number 5    Number of Visits 24    Date for PT Re-Evaluation 04/21/22    Authorization Type Medicare A&B Primary    PT Start Time 1356    PT Stop Time 1430    PT Time Calculation (min) 34 min    Equipment Utilized During Treatment Gait belt    Activity Tolerance Patient tolerated treatment well;No increased pain    Behavior During Therapy WFL for tasks assessed/performed                 Past Medical History:  Diagnosis Date   Arthritis    Complication of anesthesia    nausea   Dyspnea    with exertion   Family history of adverse reaction to anesthesia    PONV- MOM   Hypertension    Hypothyroidism    PONV (postoperative nausea and vomiting)    Sleep apnea    uses CPAP nightly   Stress incontinence    Valvular insufficiency, mitral    Past Surgical History:  Procedure Laterality Date   ANKLE ARTHROSCOPY WITH ARTHRODESIS Left 12/05/2021   Procedure: REPAIR OF TIBIAL NONUNION WITH AUTOGRAFT, TIBIOTALAR ARTHRODESIS;  Surgeon: Terance Hart, MD;  Location: [facility] OR;  Service: Orthopedics;  Laterality: Left;   APPLICATION OF WOUND VAC Right 01/04/2021   Procedure: APPLICATION OF WOUND VAC;  Surgeon: Myrene Galas, MD;  Location: [facility] OR;  Service: Orthopedics;  Laterality: Right;   BREAST CYST ASPIRATION Bilateral    neg   BREAST EXCISIONAL BIOPSY Right    1970's   CATARACT EXTRACTION W/ INTRAOCULAR LENS  IMPLANT, BILATERAL Bilateral    COLONOSCOPY     DILATION AND CURETTAGE OF UTERUS     HARDWARE REMOVAL Left 12/05/2021   Procedure: LEFT ANKLE DEEP HARDWARE REMOVAL, MEDIAL AND LATERAL;  Surgeon: Terance Hart, MD;  Location: [facility] OR;  Service: Orthopedics;  Laterality: Left;  LENGTH OF SURGERY: 150 MINUTES   I & D EXTREMITY Left 01/04/2021    Procedure: IRRIGATION AND DEBRIDEMENT LEFT ANKLE;  Surgeon: Myrene Galas, MD;  Location: [facility] OR;  Service: Orthopedics;  Laterality: Left;   I & D EXTREMITY Left 01/07/2021   Procedure: IRRIGATION AND DEBRIDEMENT EXTREMITY, Left ankle;  Surgeon: Myrene Galas, MD;  Location: [facility] OR;  Service: Orthopedics;  Laterality: Left;   KNEE ARTHROPLASTY Left 11/24/2016   Procedure: COMPUTER ASSISTED TOTAL KNEE ARTHROPLASTY;  Surgeon: Donato Heinz, MD;  Location: [facility] ORS;  Service: Orthopedics;  Laterality: Left;   KNEE ARTHROPLASTY Right 12/31/2020   Procedure: COMPUTER ASSISTED TOTAL KNEE ARTHROPLASTY;  Surgeon: Donato Heinz, MD;  Location: [facility];  Service: Orthopedics;  Laterality: Right;   KNEE ARTHROSCOPY Left    KNEE ARTHROSCOPY Left 07/18/2015   Procedure: ARTHROSCOPY KNEE WITH MEDIAL COMPARTMENTAL MENICUS CHONDROPLASTY;  Surgeon: Donato Heinz, MD;  Location: [facility];  Service: Orthopedics;  Laterality: Left;   KNEE ARTHROSCOPY WITH LATERAL MENISECTOMY Left 07/18/2015   Procedure: LEFT KNEE ARTHROSCOPY WITH PARTIAL LATERAL MENISECTOMY GRADE 4 CHONDROMYLASIA LATERAL TIBIAL PLATEAU;  Surgeon: Donato Heinz, MD;  Location: [facility];  Service: Orthopedics;  Laterality: Left;   ORIF ANKLE FRACTURE Left 01/04/2021   Procedure: OPEN REDUCTION INTERNAL FIXATION (ORIF) ANKLE FRACTURE;  Surgeon: Myrene Galas, MD;  Location: [facility];  Service: Orthopedics;  Laterality: Left;   ORIF ANKLE FRACTURE Left 01/07/2021   Procedure: SCREW REMOVAL ANKLE REMOVAL OF WOUND VAC LEFT LEG;  Surgeon: Myrene Galas, MD;  Location: [facility];  Service: Orthopedics;  Laterality: Left;   Patient Active Problem List   Diagnosis Date Noted   Arthritis of left ankle 12/05/2021   Sleep disturbance    Acute blood loss anemia 01/18/2021   Hypokalemia 01/18/2021   Constipation 01/18/2021   Trauma 01/08/2021   Open left ankle fracture, sequela 01/03/2021   Essential hypertension 01/03/2021   Hypothyroidism 01/03/2021    Total knee replacement status 12/31/2020   Family history of heart disease 04/12/2018   History of total knee arthroplasty, left 11/24/2016   Adult idiopathic generalized osteoporosis 03/01/2015   OSA on CPAP 01/30/2014    PCP: Bethann Punches, MD  REFERRING PROVIDER: Terance Hart, MD  REFERRING DIAG: Left 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  Pt notes she did not get up on her leg much over the weekend, but she did do her exercises.  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 PMH includes HTN, valvular insufficiency, SUI.    PAIN:  Are you having pain? Yes: NPRS scale: 2/10 Pain location: 2/10 in both the knee and the ankle Pain description: soreness Aggravating factors: Being on the knee scooter is more problematic on the surgically fixed knee.  PRECAUTIONS: Other: NWB on L LE for first 8 weeks (01/30/22)  WEIGHT BEARING RESTRICTIONS: Yes Non-weight bearing until the end of this week, 01/30/22  FALLS:  Has patient fallen in last 6 months? No  LIVING ENVIRONMENT: Lives with: lives with their spouse Lives in: House/apartment Stairs: Yes: Internal: 15 steps; on left going up and External: 4 steps; bilateral but cannot reach both Has following equipment at home: Single point  cane, Walker - 2 wheeled, Environmental consultant - 4 wheeled, Crutches, Tour manager, bed side commode, and Knee Scooter  OCCUPATION: Retired  PLOF: Independent  PATIENT GOALS: Pt wants to be able to go up/down the flight of steps.  NEXT MD VISIT: 02/26/22  OBJECTIVE:   DIAGNOSTIC FINDINGS:   CLINICAL DATA:  Fluoroscopic assistance for revision of internal fixation in left ankle   EXAM: LEFT ANKLE COMPLETE - 3+ VIEW   COMPARISON:  CT done on 09/24/2021   FINDINGS: There is interval placement of a new longer screw transfixing the medial malleolus. There is a new metallic plate along the anterior margin of distal tibia anchored to tibia and talus. Fluoroscopic time 1 minute and 11 seconds. Radiation dose 1.46 mGy.   IMPRESSION: Fluoroscopic assistance was provided for revision of internal fixation in left ankle.  PATIENT SURVEYS:  FOTO 40/50  COGNITION: Overall cognitive status: Within functional limits for tasks assessed     SENSATION: WFL  PALPATION: No tenderness surrounding the ankle  LOWER EXTREMITY ROM:  Active ROM Right eval Left eval  Hip flexion    Hip extension    Hip abduction    Hip adduction    Hip internal rotation    Hip external rotation    Knee flexion    Knee extension    Ankle dorsiflexion  2 deg  Ankle plantarflexion  13 deg  Ankle inversion  7 deg  Ankle eversion  3 deg   (Blank rows = not tested)  LOWER EXTREMITY MMT:  MMT Right eval Left eval  Hip flexion    Hip extension    Hip abduction    Hip adduction    Hip internal rotation    Hip external rotation    Knee flexion    Knee extension    Ankle dorsiflexion    Ankle plantarflexion    Ankle inversion    Ankle eversion     (Blank rows = not tested)  LOWER EXTREMITY SPECIAL TESTS:  Ankle special tests: Not performed at this time  FUNCTIONAL TESTS:  5 times sit to stand: Not performed due to weightbearing restrictions. Timed up and go (TUG): Not performed due to weightbearing  restrictions. 10 meter walk test: Not performed due to weightbearing restrictions. Dynamic Gait Index: Not performed due to weightbearing restrictions.  GAIT: Distance walked: Not performed due to weightbearing restrictions.    TODAY'S TREATMENT:   DATE: 02/10/22  TherEx:  Ambulation around the gym with rollator and use of the boot.  x2 laps total, pt advised to continue with heel strike and improving overall ROM to return to more normalized walking.  Manual:  STM applied to LE with focus placed on anterior tibialis, gastroc/soleus complex, and plantar fascia.  Pt with noticeable TP's noted in all musculature and pain in the plantar fascia and anterior tib Long sitting distal tibifibular AP mobilizations, Grades II-III, for pain modulation and improved ankle ROM Long sitting metatarsal fan mobilization, Grades III, to improve ROM of the metatarsals Long sitting subtalar medial/lateral glides, Grades II-III, for pain modulation and improved ankle ROM Seated IASTM applied to the posterior/lateral calf for increased tissue extensibility Seated IASTM applied to the plantar fascia for increased tissue extensibility and decreased pain in the foot  Mentioned performing TDN at future session as a means to alleviate pain in the LE.   PATIENT EDUCATION:  Education details: Pt educated on role of PT and services provided during current POC, along with prognosis and information about the clinic.  Person educated: Patient and Spouse Education method: Explanation Education comprehension: verbalized understanding  HOME EXERCISE PROGRAM:  Access Code: [email or web address] URL: [email or web address] Date: 01/27/2022 Prepared by: Tomasa Hose  Exercises - Towel Scrunches  - 1 x daily - 7 x weekly - 3 sets - 10 reps - Seated Heel Raise  - 1 x daily - 7 x weekly - 3 sets - 10 reps - Seated Long Arc Quad  - 1 x daily - 7 x weekly - 3 sets - 10 reps - Ankle Inversion Eversion Towel Slide  -  1 x daily - 7 x weekly - 3 sets - 10 reps - Arch Lifting  - 1 x daily - 7 x weekly - 3 sets - 10 reps   ASSESSMENT:  CLINICAL IMPRESSION:  Pt arrived to the clinic late, so therapy session was abbreviated.  Pt able to tolerate increased time on foot without significant pain.  Pt notes to have difficulty placing increased weight through the toes due to the increased pain  in the ankle when performing.  Pt noted to also continue having TP's noted in the calf musculature and along the Anterior Tibialis.  Discuss dry needling with pt for future session as appropriate and will continue to discuss options in order to alleviate pain and improve functional ROM.   Pt will continue to benefit from skilled therapy to address remaining deficits in order to improve overall QoL and return to PLOF.       OBJECTIVE IMPAIRMENTS: Abnormal gait, decreased balance, decreased mobility, difficulty walking, decreased ROM, decreased strength, hypomobility, impaired flexibility, and pain.   ACTIVITY LIMITATIONS: standing, squatting, stairs, transfers, bed mobility, bathing, dressing, and locomotion level  PARTICIPATION LIMITATIONS: meal prep, cleaning, laundry, driving, shopping, community activity, yard work, and church  PERSONAL FACTORS: Age, Past/current experiences, Time since onset of injury/illness/exacerbation, and 3+ comorbidities: arthritis, HTN, generalized osteoporosis  are also affecting patient's functional outcome.   REHAB POTENTIAL: Good  CLINICAL DECISION MAKING: Stable/uncomplicated  EVALUATION COMPLEXITY: Moderate   GOALS: Goals reviewed with patient? Yes  SHORT TERM GOALS: Target date: 02/24/2022    Pt will be independent with HEP in order to demonstrate increased ability to perform tasks related to occupation/hobbies. Baseline: Pt given HEP at initial evaluation Goal status: INITIAL   LONG TERM GOALS: Target date: 04/21/2022  1.  Patient (> 80 years old) will complete five times sit  to stand test in < 15 seconds indicating an increased LE strength and improved balance. Baseline: Not performed due to weightbearing restrictions. Goal status: INITIAL  2.  Patient will increase FOTO score to equal to or greater than  50   to demonstrate statistically significant improvement in mobility and quality of life.  Baseline: FOTO: 40 Goal status: INITIAL   3.  Patient will increase Berg Balance score by > 6 points to demonstrate decreased fall risk during functional activities. Baseline: Not performed due to weightbearing restrictions. Goal status: INITIAL   4.  Patient will reduce timed up and go to <11 seconds to reduce fall risk and demonstrate improved transfer/gait ability. Baseline: Not performed due to weightbearing restrictions. Goal status: INITIAL  5.  Patient will increase 10 meter walk test to >1.7935m/s as to improve gait speed for better community ambulation and to reduce fall risk. Baseline: Not performed due to weightbearing restrictions. Goal status: INITIAL  6.  Patient will increase six minute walk test distance to >1000 for progression to community ambulator and improve gait ability Baseline: Not performed due to weightbearing restrictions. Goal status: INITIAL     PLAN:  PT FREQUENCY: 2x/week  PT DURATION: 12 weeks  PLANNED INTERVENTIONS: Therapeutic exercises, Therapeutic activity, Neuromuscular re-education, Balance training, Gait training, Patient/Family education, Self Care, Joint mobilization, Stair training, DME instructions, Aquatic Therapy, Dry Needling, Electrical stimulation, Cryotherapy, Moist heat, scar mobilization, Ultrasound, Parrafin, Fluidotherapy, and Manual therapy  PLAN FOR NEXT SESSION:   Potential DN of the calf. Continue with weightbearing if tolerated. Assess compliance with HEP, potentially assess weightbearing status prior to pt attempting on her own.  Assess R LE ROM and LE strength.   Nolon BussingJoshua Stormey Wilborn, PT, DPT Physical  Therapist- [facility]  [facility]  02/10/22, 4:59 PM

## 2022-02-12 ENCOUNTER — Ambulatory Visit: Payer: Medicare Other

## 2022-02-12 DIAGNOSIS — R269 Unspecified abnormalities of gait and mobility: Secondary | ICD-10-CM

## 2022-02-12 DIAGNOSIS — R262 Difficulty in walking, not elsewhere classified: Secondary | ICD-10-CM | POA: Diagnosis not present

## 2022-02-12 DIAGNOSIS — M25572 Pain in left ankle and joints of left foot: Secondary | ICD-10-CM

## 2022-02-12 DIAGNOSIS — M6281 Muscle weakness (generalized): Secondary | ICD-10-CM

## 2022-02-12 DIAGNOSIS — R52 Pain, unspecified: Secondary | ICD-10-CM

## 2022-02-12 DIAGNOSIS — M25672 Stiffness of left ankle, not elsewhere classified: Secondary | ICD-10-CM

## 2022-02-12 NOTE — Therapy (Signed)
OUTPATIENT PHYSICAL THERAPY LOWER EXTREMITY TREATMENT   Patient Name: Hannah Tucker MRN: [id number] DOB:01-25-1941, 81 y.o., female Today's Date: 02/12/2022  END OF SESSION:  PT End of Session - 02/12/22 1306     Visit Number 6    Number of Visits 24    Date for PT Re-Evaluation 04/21/22    Authorization Type Medicare A&B Primary    PT Start Time 1305    PT Stop Time 1345    PT Time Calculation (min) 40 min    Equipment Utilized During Treatment Gait belt    Activity Tolerance Patient tolerated treatment well;No increased pain    Behavior During Therapy WFL for tasks assessed/performed                Past Medical History:  Diagnosis Date   Arthritis    Complication of anesthesia    nausea   Dyspnea    with exertion   Family history of adverse reaction to anesthesia    PONV- MOM   Hypertension    Hypothyroidism    PONV (postoperative nausea and vomiting)    Sleep apnea    uses CPAP nightly   Stress incontinence    Valvular insufficiency, mitral    Past Surgical History:  Procedure Laterality Date   ANKLE ARTHROSCOPY WITH ARTHRODESIS Left 12/05/2021   Procedure: REPAIR OF TIBIAL NONUNION WITH AUTOGRAFT, TIBIOTALAR ARTHRODESIS;  Surgeon: Erle Crocker, MD;  Location: [place];  Service: Orthopedics;  Laterality: Left;   APPLICATION OF WOUND VAC Right 01/04/2021   Procedure: APPLICATION OF WOUND VAC;  Surgeon: Altamese Buhl, MD;  Location: [place];  Service: Orthopedics;  Laterality: Right;   BREAST CYST ASPIRATION Bilateral    neg   BREAST EXCISIONAL BIOPSY Right    1970's   CATARACT EXTRACTION W/ INTRAOCULAR LENS  IMPLANT, BILATERAL Bilateral    COLONOSCOPY     DILATION AND CURETTAGE OF UTERUS     HARDWARE REMOVAL Left 12/05/2021   Procedure: LEFT ANKLE DEEP HARDWARE REMOVAL, MEDIAL AND LATERAL;  Surgeon: Erle Crocker, MD;  Location: [place];  Service: Orthopedics;  Laterality: Left;  LENGTH OF SURGERY: 150 MINUTES   I & D EXTREMITY Left 01/04/2021    Procedure: IRRIGATION AND DEBRIDEMENT LEFT ANKLE;  Surgeon: Altamese [place], MD;  Location: [place];  Service: Orthopedics;  Laterality: Left;   I & D EXTREMITY Left 01/07/2021   Procedure: IRRIGATION AND DEBRIDEMENT EXTREMITY, Left ankle;  Surgeon: Altamese [place], MD;  Location: [place];  Service: Orthopedics;  Laterality: Left;   KNEE ARTHROPLASTY Left 11/24/2016   Procedure: COMPUTER ASSISTED TOTAL KNEE ARTHROPLASTY;  Surgeon: Dereck Leep, MD;  Location: [facility];  Service: Orthopedics;  Laterality: Left;   KNEE ARTHROPLASTY Right 12/31/2020   Procedure: COMPUTER ASSISTED TOTAL KNEE ARTHROPLASTY;  Surgeon: Dereck Leep, MD;  Location: [facility];  Service: Orthopedics;  Laterality: Right;   KNEE ARTHROSCOPY Left    KNEE ARTHROSCOPY Left 07/18/2015   Procedure: ARTHROSCOPY KNEE WITH MEDIAL COMPARTMENTAL MENICUS CHONDROPLASTY;  Surgeon: Dereck Leep, MD;  Location: [facility];  Service: Orthopedics;  Laterality: Left;   KNEE ARTHROSCOPY WITH LATERAL MENISECTOMY Left 07/18/2015   Procedure: LEFT KNEE ARTHROSCOPY WITH PARTIAL LATERAL MENISECTOMY GRADE 4 CHONDROMYLASIA LATERAL TIBIAL PLATEAU;  Surgeon: Dereck Leep, MD;  Location: [facility];  Service: Orthopedics;  Laterality: Left;   ORIF ANKLE FRACTURE Left 01/04/2021   Procedure: OPEN REDUCTION INTERNAL FIXATION (ORIF) ANKLE FRACTURE;  Surgeon: Altamese , MD;  Location: [place];  Service:  Orthopedics;  Laterality: Left;   ORIF ANKLE FRACTURE Left 01/07/2021   Procedure: SCREW REMOVAL ANKLE REMOVAL OF WOUND VAC LEFT LEG;  Surgeon: Altamese [place], MD;  Location: [place];  Service: Orthopedics;  Laterality: Left;   Patient Active Problem List   Diagnosis Date Noted   Arthritis of left ankle 12/05/2021   Sleep disturbance    Acute blood loss anemia 01/18/2021   Hypokalemia 01/18/2021   Constipation 01/18/2021   Trauma 01/08/2021   Open left ankle fracture, sequela 01/03/2021   Essential hypertension 01/03/2021   Hypothyroidism 01/03/2021    Total knee replacement status 12/31/2020   Family history of heart disease 04/12/2018   History of total knee arthroplasty, left 11/24/2016   Adult idiopathic generalized osteoporosis 03/01/2015   OSA on CPAP 01/30/2014    PCP: Emily Filbert, MD  REFERRING PROVIDER: Erle Crocker, MD  REFERRING DIAG: Left tibiotalar arthrodesis with repair of tibial nonunion and hardware removal, 12/06/21  THERAPY DIAG:  Difficulty in walking, not elsewhere classified  Muscle weakness (generalized)  Pain in left ankle and joints of left foot  Abnormality of gait and mobility  Stiffness of left ankle, not elsewhere classified  Pain  Rationale for Evaluation and Treatment: Rehabilitation  ONSET DATE: 12/06/21  SUBJECTIVE:   SUBJECTIVE STATEMENT:  Pt reports she was in a little bit of pain when walking around the house yesterday, but it is improved today.  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 PMH includes HTN, valvular insufficiency, SUI.    PAIN:  Are you having pain? Yes: NPRS scale: 1/10 Pain location: 1/10 in ankle, 2/10 in the L knee Pain description: soreness Aggravating factors: Being on the knee scooter is more problematic on the surgically fixed knee.  PRECAUTIONS: Other: NWB on L LE for first 8 weeks (01/30/22)  WEIGHT BEARING RESTRICTIONS: Yes Non-weight bearing until the end of this week, 01/30/22  FALLS:  Has patient fallen in last 6 months? No  LIVING ENVIRONMENT: Lives with: lives with their spouse Lives in: House/apartment Stairs: Yes: Internal: 15 steps; on left going up and External: 4 steps; bilateral but  cannot reach both Has following equipment at home: Single point cane, Walker - 2 wheeled, Environmental consultant - 4 wheeled, Crutches, Electronics engineer, bed side commode, and Knee Scooter  OCCUPATION: Retired  PLOF: Independent  PATIENT GOALS: Pt wants to be able to go up/down the flight of steps.  NEXT MD VISIT: 02/26/22  OBJECTIVE:   DIAGNOSTIC FINDINGS:   CLINICAL DATA:  Fluoroscopic assistance for revision of internal fixation in left ankle   EXAM: LEFT ANKLE COMPLETE - 3+ VIEW   COMPARISON:  CT done on 09/24/2021   FINDINGS: There is interval placement of a new longer screw transfixing the medial malleolus. There is a new metallic plate along the anterior margin of distal tibia anchored to tibia and talus. Fluoroscopic time 1 minute and 11 seconds. Radiation dose 1.46 mGy.   IMPRESSION: Fluoroscopic assistance was provided for revision of internal fixation in left ankle.  PATIENT SURVEYS:  FOTO 40/50  COGNITION: Overall cognitive status: Within functional limits for tasks assessed     SENSATION: WFL  PALPATION: No  tenderness surrounding the ankle  LOWER EXTREMITY ROM:  Active ROM Right eval Left eval  Hip flexion    Hip extension    Hip abduction    Hip adduction    Hip internal rotation    Hip external rotation    Knee flexion    Knee extension    Ankle dorsiflexion  2 deg  Ankle plantarflexion  13 deg  Ankle inversion  7 deg  Ankle eversion  3 deg   (Blank rows = not tested)  LOWER EXTREMITY MMT:  MMT Right eval Left eval  Hip flexion    Hip extension    Hip abduction    Hip adduction    Hip internal rotation    Hip external rotation    Knee flexion    Knee extension    Ankle dorsiflexion    Ankle plantarflexion    Ankle inversion    Ankle eversion     (Blank rows = not tested)   LOWER EXTREMITY SPECIAL TESTS:   Ankle special tests: Not performed at this time   FUNCTIONAL TESTS:   5 times sit to stand: 19.41 sec Timed up and go (TUG):  18.25 sec 10 meter walk test: 18.54 sec Dynamic Gait Index: Not performed due to weightbearing restrictions.   GAIT: Distance walked: Not performed due to weightbearing restrictions.    TODAY'S TREATMENT:   DATE: 02/12/22  TherEx:  Ambulation around the gym x1 lap with RW for UE assistance, pt noting some soreness but overall doing well Ambulation up/down stairs x3 total with CGA for safety Goal assessment performed as noted below in order to establish values since pt is able to tolerate weight bearing at this time.  Manual:  STM applied to LE with focus placed on anterior tibialis, gastroc/soleus complex, and plantar fascia.  Pt with noticeable TP's noted in all musculature and pain in the plantar fascia and anterior tib Long sitting distal tibifibular AP mobilizations, Grades II-III, for pain modulation and improved ankle ROM Long sitting metatarsal fan mobilization, Grades III, to improve ROM of the metatarsals Long sitting subtalar medial/lateral glides, Grades II-III, for pain modulation and improved ankle ROM Seated IASTM applied to the posterior/lateral calf for increased tissue extensibility Seated IASTM applied to the plantar fascia for increased tissue extensibility and decreased pain in the foot   PATIENT EDUCATION:  Education details: Pt educated on role of PT and services provided during current POC, along with prognosis and information about the clinic.  Person educated: Patient and Spouse Education method: Explanation Education comprehension: verbalized understanding  HOME EXERCISE PROGRAM:  Access Code: [email or web address] URL: [email or web address] Date: 01/27/2022 Prepared by: Tomasa Hose  Exercises - Towel Scrunches  - 1 x daily - 7 x weekly - 3 sets - 10 reps - Seated Heel Raise  - 1 x daily - 7 x weekly - 3 sets - 10 reps - Seated Long Arc Quad  - 1 x daily - 7 x weekly - 3 sets - 10 reps - Ankle Inversion Eversion Towel Slide  - 1 x daily - 7 x  weekly - 3 sets - 10 reps - Arch Lifting  - 1 x daily - 7 x weekly - 3 sets - 10 reps   ASSESSMENT:  CLINICAL IMPRESSION:  Pt is making good progress with tolerance to manual therapy and reduction in soft tissue restrictions.  Pt participated in goal assessment since she is now able to tolerate increased weight bearing on the L LE.  Pt still having  some discomfort in the L knee that is also inhibiting her from being able to ambulate with greater form/speed.   Pt will continue to benefit from skilled therapy to address remaining deficits in order to improve overall QoL and return to PLOF.     OBJECTIVE IMPAIRMENTS: Abnormal gait, decreased balance, decreased mobility, difficulty walking, decreased ROM, decreased strength, hypomobility, impaired flexibility, and pain.   ACTIVITY LIMITATIONS: standing, squatting, stairs, transfers, bed mobility, bathing, dressing, and locomotion level  PARTICIPATION LIMITATIONS: meal prep, cleaning, laundry, driving, shopping, community activity, yard work, and church  PERSONAL FACTORS: Age, Past/current experiences, Time since onset of injury/illness/exacerbation, and 3+ comorbidities: arthritis, HTN, generalized osteoporosis  are also affecting patient's functional outcome.   REHAB POTENTIAL: Good  CLINICAL DECISION MAKING: Stable/uncomplicated  EVALUATION COMPLEXITY: Moderate   GOALS: Goals reviewed with patient? Yes  SHORT TERM GOALS: Target date: 02/24/2022    Pt will be independent with HEP in order to demonstrate increased ability to perform tasks related to occupation/hobbies. Baseline: Pt given HEP at initial evaluation Goal status: INITIAL   LONG TERM GOALS: Target date: 04/21/2022  1.  Patient (> 34 years old) will complete five times sit to stand test in < 15 seconds indicating an increased LE strength and improved balance. Baseline: Not performed due to weightbearing restrictions. Goal status: INITIAL  2.  Patient will increase  FOTO score to equal to or greater than  50   to demonstrate statistically significant improvement in mobility and quality of life.  Baseline: FOTO: 40 Goal status: INITIAL   3.  Patient will increase Berg Balance score by > 6 points to demonstrate decreased fall risk during functional activities. Baseline: Not performed due to time constraints. Goal status: INITIAL   4.  Patient will reduce timed up and go to <11 seconds to reduce fall risk and demonstrate improved transfer/gait ability. Baseline: 11.39 sec Goal status: INITIAL  5.  Patient will increase 10 meter walk test to >1.17m/s as to improve gait speed for better community ambulation and to reduce fall risk. Baseline: 18.59 sec - 0.54 m/s Goal status: INITIAL  6.  Patient will increase six minute walk test distance to >1000 for progression to community ambulator and improve gait ability Baseline: Not performed at this time and will perform at later date. Goal status: INITIAL     PLAN:  PT FREQUENCY: 2x/week  PT DURATION: 12 weeks  PLANNED INTERVENTIONS: Therapeutic exercises, Therapeutic activity, Neuromuscular re-education, Balance training, Gait training, Patient/Family education, Self Care, Joint mobilization, Stair training, DME instructions, Aquatic Therapy, Dry Needling, Electrical stimulation, Cryotherapy, Moist heat, scar mobilization, Ultrasound, Parrafin, Fluidotherapy, and Manual therapy  PLAN FOR NEXT SESSION:   Continue with weightbearing if tolerated. Assess compliance with HEP, potentially assess weightbearing status prior to pt attempting on her own.  Assess R LE ROM and LE strength.   Gwenlyn Saran, PT, DPT Physical Therapist- [facility]  02/12/22, 5:42 PM

## 2022-02-17 ENCOUNTER — Ambulatory Visit: Payer: Medicare Other

## 2022-02-17 DIAGNOSIS — M6281 Muscle weakness (generalized): Secondary | ICD-10-CM

## 2022-02-17 DIAGNOSIS — R52 Pain, unspecified: Secondary | ICD-10-CM

## 2022-02-17 DIAGNOSIS — M25672 Stiffness of left ankle, not elsewhere classified: Secondary | ICD-10-CM

## 2022-02-17 DIAGNOSIS — M25572 Pain in left ankle and joints of left foot: Secondary | ICD-10-CM

## 2022-02-17 DIAGNOSIS — R262 Difficulty in walking, not elsewhere classified: Secondary | ICD-10-CM | POA: Diagnosis not present

## 2022-02-17 DIAGNOSIS — R269 Unspecified abnormalities of gait and mobility: Secondary | ICD-10-CM

## 2022-02-17 NOTE — Therapy (Signed)
OUTPATIENT PHYSICAL THERAPY LOWER EXTREMITY TREATMENT   Patient Name: Hannah Tucker MRN: [id number] DOB:[birth year], 81 y.o., female Today's Date: 02/17/2022  END OF SESSION:  PT End of Session - 02/17/22 1112     Visit Number 7    Number of Visits 24    Date for PT Re-Evaluation 04/21/22    Authorization Type Medicare A&B Primary    PT Start Time 1016    PT Stop Time 1100    PT Time Calculation (min) 44 min    Equipment Utilized During Treatment Gait belt    Activity Tolerance Patient tolerated treatment well;No increased pain    Behavior During Therapy WFL for tasks assessed/performed                 Past Medical History:  Diagnosis Date   Arthritis    Complication of anesthesia    nausea   Dyspnea    with exertion   Family history of adverse reaction to anesthesia    PONV- MOM   Hypertension    Hypothyroidism    PONV (postoperative nausea and vomiting)    Sleep apnea    uses CPAP nightly   Stress incontinence    Valvular insufficiency, mitral    Past Surgical History:  Procedure Laterality Date   ANKLE ARTHROSCOPY WITH ARTHRODESIS Left 12/05/2021   Procedure: REPAIR OF TIBIAL NONUNION WITH AUTOGRAFT, TIBIOTALAR ARTHRODESIS;  Surgeon: Terance Hart, MD;  Location: [facility] OR;  Service: Orthopedics;  Laterality: Left;   APPLICATION OF WOUND VAC Right 01/04/2021   Procedure: APPLICATION OF WOUND VAC;  Surgeon: Myrene Galas, MD;  Location: [facility] OR;  Service: Orthopedics;  Laterality: Right;   BREAST CYST ASPIRATION Bilateral    neg   BREAST EXCISIONAL BIOPSY Right    1970's   CATARACT EXTRACTION W/ INTRAOCULAR LENS  IMPLANT, BILATERAL Bilateral    COLONOSCOPY     DILATION AND CURETTAGE OF UTERUS     HARDWARE REMOVAL Left 12/05/2021   Procedure: LEFT ANKLE DEEP HARDWARE REMOVAL, MEDIAL AND LATERAL;  Surgeon: Terance Hart, MD;  Location: [facility] OR;  Service: Orthopedics;  Laterality: Left;  LENGTH OF SURGERY: 150 MINUTES   I & D EXTREMITY Left 01/04/2021    Procedure: IRRIGATION AND DEBRIDEMENT LEFT ANKLE;  Surgeon: Myrene Galas, MD;  Location: [facility] OR;  Service: Orthopedics;  Laterality: Left;   I & D EXTREMITY Left 01/07/2021   Procedure: IRRIGATION AND DEBRIDEMENT EXTREMITY, Left ankle;  Surgeon: Myrene Galas, MD;  Location: [facility] OR;  Service: Orthopedics;  Laterality: Left;   KNEE ARTHROPLASTY Left 11/24/2016   Procedure: COMPUTER ASSISTED TOTAL KNEE ARTHROPLASTY;  Surgeon: Donato Heinz, MD;  Location: [facility] ORS;  Service: Orthopedics;  Laterality: Left;   KNEE ARTHROPLASTY Right 12/31/2020   Procedure: COMPUTER ASSISTED TOTAL KNEE ARTHROPLASTY;  Surgeon: Donato Heinz, MD;  Location: [facility];  Service: Orthopedics;  Laterality: Right;   KNEE ARTHROSCOPY Left    KNEE ARTHROSCOPY Left 07/18/2015   Procedure: ARTHROSCOPY KNEE WITH MEDIAL COMPARTMENTAL MENICUS CHONDROPLASTY;  Surgeon: Donato Heinz, MD;  Location: [facility];  Service: Orthopedics;  Laterality: Left;   KNEE ARTHROSCOPY WITH LATERAL MENISECTOMY Left 07/18/2015   Procedure: LEFT KNEE ARTHROSCOPY WITH PARTIAL LATERAL MENISECTOMY GRADE 4 CHONDROMYLASIA LATERAL TIBIAL PLATEAU;  Surgeon: Donato Heinz, MD;  Location: [facility] OR[facility];  Service: Orthopedics;  Laterality: Left;   ORIF ANKLE FRACTURE Left 01/04/2021   Procedure: OPEN REDUCTION INTERNAL FIXATION (ORIF) ANKLE FRACTURE;  Surgeon: Myrene Galas, MD;  Location: [facility];  Service: Orthopedics;  Laterality: Left;   ORIF ANKLE FRACTURE Left 01/07/2021   Procedure: SCREW REMOVAL ANKLE REMOVAL OF WOUND VAC LEFT LEG;  Surgeon: Myrene GalasHandy, Michael, MD;  Location: [facility] OR;  Service: Orthopedics;  Laterality: Left;   Patient Active Problem List   Diagnosis Date Noted   Arthritis of left ankle 12/05/2021   Sleep disturbance    Acute blood loss anemia 01/18/2021   Hypokalemia 01/18/2021   Constipation 01/18/2021   Trauma 01/08/2021   Open left ankle fracture, sequela 01/03/2021   Essential hypertension 01/03/2021   Hypothyroidism 01/03/2021    Total knee replacement status 12/31/2020   Family history of heart disease 04/12/2018   History of total knee arthroplasty, left 11/24/2016   Adult idiopathic generalized osteoporosis 03/01/2015   OSA on CPAP 01/30/2014    PCP: Bethann PunchesMiller, Mark, MD  REFERRING PROVIDER: Terance HartAdair, Christopher R, MD  REFERRING DIAG: Left tibiotalar arthrodesis with repair of tibial nonunion and hardware removal, 12/06/21  THERAPY DIAG:  Difficulty in walking, not elsewhere classified  Muscle weakness (generalized)  Pain in left ankle and joints of left foot  Abnormality of gait and mobility  Stiffness of left ankle, not elsewhere classified  Pain  Rationale for Evaluation and Treatment: Rehabilitation  ONSET DATE: 12/06/21  SUBJECTIVE:   SUBJECTIVE STATEMENT:  Pt reports she had to change her time as she is going to try to go to a funeral of a friend later today. States only having pain with walking and not near as tender in her left foot/ankle as last week. Reports continuing to utilize the knee scooter for longer distances, but using her front wheeled walker at home in the kitchen when cooking/doing housework.    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 PMH includes HTN, valvular insufficiency, SUI.    PAIN:  Are you having pain? Yes: NPRS scale: 1/10 Pain location: 1/10 in ankle, 2/10 in the L knee Pain description: soreness Aggravating factors: Being on the knee scooter is more problematic on the surgically fixed knee.  PRECAUTIONS: Other: NWB on L LE for first 8 weeks (01/30/22)  WEIGHT BEARING RESTRICTIONS: Yes Non-weight bearing until the end of this week, 01/30/22  FALLS:  Has patient fallen in last 6 months? No  LIVING ENVIRONMENT: Lives with: lives with their spouse Lives in:  House/apartment Stairs: Yes: Internal: 15 steps; on left going up and External: 4 steps; bilateral but cannot reach both Has following equipment at home: Single point cane, Walker - 2 wheeled, Environmental consultantWalker - 4 wheeled, Crutches, Tour managerhower bench, bed side commode, and Knee Scooter  OCCUPATION: Retired  PLOF: Independent  PATIENT GOALS: Pt wants to be able to go up/down the flight of steps.  NEXT MD VISIT: 02/26/22  OBJECTIVE:   DIAGNOSTIC FINDINGS:   CLINICAL DATA:  Fluoroscopic assistance for revision of internal fixation in left ankle   EXAM: LEFT ANKLE COMPLETE - 3+ VIEW   COMPARISON:  CT done on 09/24/2021   FINDINGS: There is interval placement of a new longer screw transfixing the medial malleolus. There is a new metallic plate along the anterior margin of distal tibia anchored to tibia and talus. Fluoroscopic time 1 minute and 11 seconds. Radiation dose 1.46 mGy.   IMPRESSION: Fluoroscopic assistance was provided for revision of internal fixation in left ankle.  PATIENT SURVEYS:  FOTO 40/50  COGNITION:  Overall cognitive status: Within functional limits for tasks assessed     SENSATION: WFL  PALPATION: No tenderness surrounding the ankle  LOWER EXTREMITY ROM:  Active ROM Right eval Left eval  Hip flexion    Hip extension    Hip abduction    Hip adduction    Hip internal rotation    Hip external rotation    Knee flexion    Knee extension    Ankle dorsiflexion  2 deg  Ankle plantarflexion  13 deg  Ankle inversion  7 deg  Ankle eversion  3 deg   (Blank rows = not tested)  LOWER EXTREMITY MMT:  MMT Right eval Left eval  Hip flexion    Hip extension    Hip abduction    Hip adduction    Hip internal rotation    Hip external rotation    Knee flexion    Knee extension    Ankle dorsiflexion    Ankle plantarflexion    Ankle inversion    Ankle eversion     (Blank rows = not tested)   LOWER EXTREMITY SPECIAL TESTS:   Ankle special tests: Not  performed at this time   FUNCTIONAL TESTS:   5 times sit to stand: 19.41 sec Timed up and go (TUG): 18.25 sec 10 meter walk test: 18.54 sec Dynamic Gait Index: Not performed due to weightbearing restrictions.   GAIT: Distance walked: Not performed due to weightbearing restrictions.    TODAY'S TREATMENT:   DATE: 02/17/22  TherEx: Active ankle ROM in long sit position- Ankle DF/PF/IV/EV x 12 reps each direction.  Ambulation around the gym x1 lap x 150 feet with RW for UE assistance, pt noting some soreness left lateral foot- ehxibiting continuous motion of walker and short reciprocal steps.  Ext hall longus/brevis (1st toe ext) x 12 reps Foot interosseoi- abduct toes 2-4 on left LE x 10 reps (patient reports has to really concentrate but able to perform with increased effort)    Manual:  STM Left LE along all incision lines (medial/anterior/lateral) Left LE.  STM- to plantar fasica with some taut bands noted but patient reports not as painful today.  Long sitting distal tibifibular AP mobilizations, Grades II-III, for pain modulation and improved ankle ROM Long sitting metatarsal fan mobilization, Grades III, to improve ROM of the metatarsals Long sitting subtalar medial/lateral glides, Grades II-III, for pain modulation and improved ankle ROM    PATIENT EDUCATION:  Education details: Pt educated on role of PT and services provided during current POC, along with prognosis and information about the clinic.  Person educated: Patient and Spouse Education method: Explanation Education comprehension: verbalized understanding  HOME EXERCISE PROGRAM:  Access Code: [email or web address] URL: [email or web address] Date: 01/27/2022 Prepared by: Tomasa Hose  Exercises - Towel Scrunches  - 1 x daily - 7 x weekly - 3 sets - 10 reps - Seated Heel Raise  - 1 x daily - 7 x weekly - 3 sets - 10 reps - Seated Long Arc Quad  - 1 x daily - 7 x weekly - 3 sets - 10 reps - Ankle  Inversion Eversion Towel Slide  - 1 x daily - 7 x weekly - 3 sets - 10 reps - Arch Lifting  - 1 x daily - 7 x weekly - 3 sets - 10 reps   ASSESSMENT:  CLINICAL IMPRESSION:  Pt is making good progress with tolerance to manual therapy and reduction in soft tissue restrictions.  Pt participated in goal assessment since she  is now able to tolerate increased weight bearing on the L LE.  Pt still having some discomfort in the L knee that is also inhibiting her from being able to ambulate with greater form/speed.   Pt will continue to benefit from skilled therapy to address remaining deficits in order to improve overall QoL and return to PLOF.     OBJECTIVE IMPAIRMENTS: Abnormal gait, decreased balance, decreased mobility, difficulty walking, decreased ROM, decreased strength, hypomobility, impaired flexibility, and pain.   ACTIVITY LIMITATIONS: standing, squatting, stairs, transfers, bed mobility, bathing, dressing, and locomotion level  PARTICIPATION LIMITATIONS: meal prep, cleaning, laundry, driving, shopping, community activity, yard work, and church  PERSONAL FACTORS: Age, Past/current experiences, Time since onset of injury/illness/exacerbation, and 3+ comorbidities: arthritis, HTN, generalized osteoporosis  are also affecting patient's functional outcome.   REHAB POTENTIAL: Good  CLINICAL DECISION MAKING: Stable/uncomplicated  EVALUATION COMPLEXITY: Moderate   GOALS: Goals reviewed with patient? Yes  SHORT TERM GOALS: Target date: 02/24/2022    Pt will be independent with HEP in order to demonstrate increased ability to perform tasks related to occupation/hobbies. Baseline: Pt given HEP at initial evaluation Goal status: INITIAL   LONG TERM GOALS: Target date: 04/21/2022  1.  Patient (> 1 years old) will complete five times sit to stand test in < 15 seconds indicating an increased LE strength and improved balance. Baseline: Not performed due to weightbearing  restrictions. Goal status: INITIAL  2.  Patient will increase FOTO score to equal to or greater than  50   to demonstrate statistically significant improvement in mobility and quality of life.  Baseline: FOTO: 40 Goal status: INITIAL   3.  Patient will increase Berg Balance score by > 6 points to demonstrate decreased fall risk during functional activities. Baseline: Not performed due to time constraints. Goal status: INITIAL   4.  Patient will reduce timed up and go to <11 seconds to reduce fall risk and demonstrate improved transfer/gait ability. Baseline: 11.39 sec Goal status: INITIAL  5.  Patient will increase 10 meter walk test to >1.88m/s as to improve gait speed for better community ambulation and to reduce fall risk. Baseline: 18.59 sec - 0.54 m/s Goal status: INITIAL  6.  Patient will increase six minute walk test distance to >1000 for progression to community ambulator and improve gait ability Baseline: Not performed at this time and will perform at later date. Goal status: INITIAL     PLAN:  PT FREQUENCY: 2x/week  PT DURATION: 12 weeks  PLANNED INTERVENTIONS: Therapeutic exercises, Therapeutic activity, Neuromuscular re-education, Balance training, Gait training, Patient/Family education, Self Care, Joint mobilization, Stair training, DME instructions, Aquatic Therapy, Dry Needling, Electrical stimulation, Cryotherapy, Moist heat, scar mobilization, Ultrasound, Parrafin, Fluidotherapy, and Manual therapy  PLAN FOR NEXT SESSION:   Continue with weightbearing if tolerated. Continue with manual therapy for ROM and pain relief. Add progressive LE strengthening as appropriate.    Louis Meckel, PT Physical Therapist- [facility]  02/17/22, 11:32 AM

## 2022-02-19 ENCOUNTER — Ambulatory Visit: Payer: Medicare Other

## 2022-02-20 ENCOUNTER — Ambulatory Visit: Payer: Medicare Other

## 2022-02-20 DIAGNOSIS — R262 Difficulty in walking, not elsewhere classified: Secondary | ICD-10-CM

## 2022-02-20 DIAGNOSIS — M25572 Pain in left ankle and joints of left foot: Secondary | ICD-10-CM

## 2022-02-20 DIAGNOSIS — M6281 Muscle weakness (generalized): Secondary | ICD-10-CM

## 2022-02-20 DIAGNOSIS — R269 Unspecified abnormalities of gait and mobility: Secondary | ICD-10-CM

## 2022-02-20 DIAGNOSIS — M25672 Stiffness of left ankle, not elsewhere classified: Secondary | ICD-10-CM

## 2022-02-20 NOTE — Therapy (Signed)
OUTPATIENT PHYSICAL THERAPY LOWER EXTREMITY TREATMENT   Patient Name: Hannah DanielDonna J Orsborn MRN: [id number] DOB:10/18/1940, 81 y.o., female Today's Date: 02/20/2022  END OF SESSION:  PT End of Session - 02/20/22 1350     Visit Number 8    Number of Visits 24    Date for PT Re-Evaluation 04/21/22    Authorization Type Medicare A&B Primary    PT Start Time 1346    PT Stop Time 1430    PT Time Calculation (min) 44 min    Equipment Utilized During Treatment Gait belt    Activity Tolerance Patient tolerated treatment well;No increased pain    Behavior During Therapy WFL for tasks assessed/performed                 Past Medical History:  Diagnosis Date   Arthritis    Complication of anesthesia    nausea   Dyspnea    with exertion   Family history of adverse reaction to anesthesia    PONV- MOM   Hypertension    Hypothyroidism    PONV (postoperative nausea and vomiting)    Sleep apnea    uses CPAP nightly   Stress incontinence    Valvular insufficiency, mitral    Past Surgical History:  Procedure Laterality Date   ANKLE ARTHROSCOPY WITH ARTHRODESIS Left 12/05/2021   Procedure: REPAIR OF TIBIAL NONUNION WITH AUTOGRAFT, TIBIOTALAR ARTHRODESIS;  Surgeon: Terance HartAdair, Christopher R, MD;  Location: [facility];  Service: Orthopedics;  Laterality: Left;   APPLICATION OF WOUND VAC Right 01/04/2021   Procedure: APPLICATION OF WOUND VAC;  Surgeon: Myrene GalasHandy, Michael, MD;  Location: [facility];  Service: Orthopedics;  Laterality: Right;   BREAST CYST ASPIRATION Bilateral    neg   BREAST EXCISIONAL BIOPSY Right    1970's   CATARACT EXTRACTION W/ INTRAOCULAR LENS  IMPLANT, BILATERAL Bilateral    COLONOSCOPY     DILATION AND CURETTAGE OF UTERUS     HARDWARE REMOVAL Left 12/05/2021   Procedure: LEFT ANKLE DEEP HARDWARE REMOVAL, MEDIAL AND LATERAL;  Surgeon: Terance HartAdair, Christopher R, MD;  Location: [facility];  Service: Orthopedics;  Laterality: Left;  LENGTH OF SURGERY: 150 MINUTES   I & D EXTREMITY Left 01/04/2021    Procedure: IRRIGATION AND DEBRIDEMENT LEFT ANKLE;  Surgeon: Myrene GalasHandy, Michael, MD;  Location: [facility];  Service: Orthopedics;  Laterality: Left;   I & D EXTREMITY Left 01/07/2021   Procedure: IRRIGATION AND DEBRIDEMENT EXTREMITY, Left ankle;  Surgeon: Myrene GalasHandy, Michael, MD;  [facility];  Service: Orthopedics;  Laterality: Left;   KNEE ARTHROPLASTY Left 11/24/2016   Procedure: COMPUTER ASSISTED TOTAL KNEE ARTHROPLASTY;  Surgeon: Donato HeinzHooten, James P, MD;  Location: [facility];  Service: Orthopedics;  Laterality: Left;   KNEE ARTHROPLASTY Right 12/31/2020   Procedure: COMPUTER ASSISTED TOTAL KNEE ARTHROPLASTY;  Surgeon: Donato HeinzHooten, James P, MD;  Location: [facility];  Service: Orthopedics;  Laterality: Right;   KNEE ARTHROSCOPY Left    KNEE ARTHROSCOPY Left 07/18/2015   Procedure: ARTHROSCOPY KNEE WITH MEDIAL COMPARTMENTAL MENICUS CHONDROPLASTY;  Surgeon: Donato HeinzJames P Hooten, MD;  Location: [facility];  Service: Orthopedics;  Laterality: Left;   KNEE ARTHROSCOPY WITH LATERAL MENISECTOMY Left 07/18/2015   Procedure: LEFT KNEE ARTHROSCOPY WITH PARTIAL LATERAL MENISECTOMY GRADE 4 CHONDROMYLASIA LATERAL TIBIAL PLATEAU;  Surgeon: Donato HeinzJames P Hooten, MD;  Location: [facility];  Service: Orthopedics;  Laterality: Left;   ORIF ANKLE FRACTURE Left 01/04/2021   Procedure: OPEN REDUCTION INTERNAL FIXATION (ORIF) ANKLE FRACTURE;  Surgeon: Myrene GalasHandy, Michael, MD;  Location: [facility];  Service: Orthopedics;  Laterality: Left;   ORIF ANKLE FRACTURE Left 01/07/2021   Procedure: SCREW REMOVAL ANKLE REMOVAL OF WOUND VAC LEFT LEG;  Surgeon: Myrene Galas, MD;  Location: [facility];  Service: Orthopedics;  Laterality: Left;   Patient Active Problem List   Diagnosis Date Noted   Arthritis of left ankle 12/05/2021   Sleep disturbance    Acute blood loss anemia 01/18/2021   Hypokalemia 01/18/2021   Constipation 01/18/2021   Trauma 01/08/2021   Open left ankle fracture, sequela 01/03/2021   Essential hypertension 01/03/2021   Hypothyroidism 01/03/2021    Total knee replacement status 12/31/2020   Family history of heart disease 04/12/2018   History of total knee arthroplasty, left 11/24/2016   Adult idiopathic generalized osteoporosis 03/01/2015   OSA on CPAP 01/30/2014    PCP: Bethann Punches, MD  REFERRING PROVIDER: Terance Hart, MD  REFERRING DIAG: Left tibiotalar arthrodesis with repair of tibial nonunion and hardware removal, 12/06/21  THERAPY DIAG:  Difficulty in walking, not elsewhere classified  Pain in left ankle and joints of left foot  Abnormality of gait and mobility  Stiffness of left ankle, not elsewhere classified  Muscle weakness (generalized)  Rationale for Evaluation and Treatment: Rehabilitation  ONSET DATE: 12/06/21  SUBJECTIVE:   SUBJECTIVE STATEMENT:  Pt reports pain as mild. 1-2/10 NPS. Up to 3/10 with ambulation. No new concerns or complaints at this time. Been ambulating at home with her RW.    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 PMH includes HTN, valvular insufficiency, SUI.    PAIN:  Are you having pain? Yes: NPRS scale: 1/10 Pain location: 1/10 in ankle, 2/10 in the L knee Pain description: soreness Aggravating factors: Being on the knee scooter is more problematic on the surgically fixed knee.  PRECAUTIONS: Other: NWB on L LE for first 8 weeks (01/30/22)  WEIGHT BEARING RESTRICTIONS: Yes Non-weight bearing until the end of this week, 01/30/22  FALLS:  Has patient fallen in last 6 months? No  LIVING ENVIRONMENT: Lives with: lives with their spouse Lives in: House/apartment Stairs: Yes: Internal: 15 steps; on left going up and External: 4 steps; bilateral but cannot reach both Has following equipment at home: Single point cane, Walker - 2 wheeled, Environmental consultant - 4 wheeled, Crutches, Tour manager, bed  side commode, and Knee Scooter  OCCUPATION: Retired  PLOF: Independent  PATIENT GOALS: Pt wants to be able to go up/down the flight of steps.  NEXT MD VISIT: 02/26/22  OBJECTIVE:   DIAGNOSTIC FINDINGS:   CLINICAL DATA:  Fluoroscopic assistance for revision of internal fixation in left ankle   EXAM: LEFT ANKLE COMPLETE - 3+ VIEW   COMPARISON:  CT done on 09/24/2021   FINDINGS: There is interval placement of a new longer screw transfixing the medial malleolus. There is a new metallic plate along the anterior margin of distal tibia anchored to tibia and talus. Fluoroscopic time 1 minute and 11 seconds. Radiation dose 1.46 mGy.   IMPRESSION: Fluoroscopic assistance was provided for revision of internal fixation in left ankle.  PATIENT SURVEYS:  FOTO 40/50  COGNITION: Overall cognitive status: Within functional limits for tasks assessed     SENSATION: WFL  PALPATION: No tenderness surrounding the ankle  LOWER EXTREMITY ROM:  Active ROM Right eval Left eval  Hip flexion    Hip extension  Hip abduction    Hip adduction    Hip internal rotation    Hip external rotation    Knee flexion    Knee extension    Ankle dorsiflexion  2 deg  Ankle plantarflexion  13 deg  Ankle inversion  7 deg  Ankle eversion  3 deg   (Blank rows = not tested)  LOWER EXTREMITY MMT:  MMT Right eval Left eval  Hip flexion    Hip extension    Hip abduction    Hip adduction    Hip internal rotation    Hip external rotation    Knee flexion    Knee extension    Ankle dorsiflexion    Ankle plantarflexion    Ankle inversion    Ankle eversion     (Blank rows = not tested)   LOWER EXTREMITY SPECIAL TESTS:   Ankle special tests: Not performed at this time   FUNCTIONAL TESTS:   5 times sit to stand: 19.41 sec Timed up and go (TUG): 18.25 sec 10 meter walk test: 18.54 sec Dynamic Gait Index: Not performed due to weightbearing restrictions.   GAIT: Distance walked:  Not performed due to weightbearing restrictions.    TODAY'S TREATMENT:   DATE: 02/20/22  There.Ex: Active ankle ROM in long sit position- Ankle DF/PF/IV/EV x 12 reps each direction.   Ambulation around the gym 2 x 150 feet with RW for UE assistance. Minimal step through gait.   Seated L foot slide into great toe extension on towel: 2x12  Educated on trialing great toe extension heel slides to improve great toe extension. Educated on need for improved great toe AROM for gait cycle in push off phase of gait.    Manual:   STM Left LE along all incision lines (medial/anterior/lateral) Left LE. 2 minutes total   STM- to plantar fascia with some taut bands noted but patient reports not as painful today. 1 minute   L foot great toe extension stretch: 3x30 sec   Long sitting AP talotibial mobs grades 2-3, 3x30 for pain modulation and improved ankle ROM  Long sitting metatarsal fan mobilization, Grades III, to improve ROM of the metatarsals. 3x30 seconds   Long sitting subtalar medial/lateral glides, Grades II-III, for pain modulation and improved ankle ROM. 3x30 sec    PATIENT EDUCATION:  Education details: Pt educated on role of PT and services provided during current POC, along with prognosis and information about the clinic.  Person educated: Patient and Spouse Education method: Explanation Education comprehension: verbalized understanding  HOME EXERCISE PROGRAM:  Access Code: [email or web address] URL: [email or web address] Date: 01/27/2022 Prepared by: Tomasa Hose  Exercises - Towel Scrunches  - 1 x daily - 7 x weekly - 3 sets - 10 reps - Seated Heel Raise  - 1 x daily - 7 x weekly - 3 sets - 10 reps - Seated Long Arc Quad  - 1 x daily - 7 x weekly - 3 sets - 10 reps - Ankle Inversion Eversion Towel Slide  - 1 x daily - 7 x weekly - 3 sets - 10 reps - Arch Lifting  - 1 x daily - 7 x weekly - 3 sets - 10 reps   ASSESSMENT:  CLINICAL IMPRESSION: Continuing PT POC  with manual intervention. Educated pt on further foot mobility to assist in great toe mobility for improved gait mechanics after pt cleared to ambulate without CAM boot. Pt understanding and performing correctly after PT demo. Pt improving in tolerance for gait with RW  with minimal L foot pain performing step through gait with decreased step lengths. Encouraged to maintain HEP performing additional great toe extension stretch. Pt will continue to benefit from skilled therapy to address remaining deficits in order to improve overall QoL and return to PLOF.    OBJECTIVE IMPAIRMENTS: Abnormal gait, decreased balance, decreased mobility, difficulty walking, decreased ROM, decreased strength, hypomobility, impaired flexibility, and pain.   ACTIVITY LIMITATIONS: standing, squatting, stairs, transfers, bed mobility, bathing, dressing, and locomotion level  PARTICIPATION LIMITATIONS: meal prep, cleaning, laundry, driving, shopping, community activity, yard work, and church  PERSONAL FACTORS: Age, Past/current experiences, Time since onset of injury/illness/exacerbation, and 3+ comorbidities: arthritis, HTN, generalized osteoporosis  are also affecting patient's functional outcome.   REHAB POTENTIAL: Good  CLINICAL DECISION MAKING: Stable/uncomplicated  EVALUATION COMPLEXITY: Moderate   GOALS: Goals reviewed with patient? Yes  SHORT TERM GOALS: Target date: 02/24/2022    Pt will be independent with HEP in order to demonstrate increased ability to perform tasks related to occupation/hobbies. Baseline: Pt given HEP at initial evaluation Goal status: INITIAL   LONG TERM GOALS: Target date: 04/21/2022  1.  Patient (> 74 years old) will complete five times sit to stand test in < 15 seconds indicating an increased LE strength and improved balance. Baseline: Not performed due to weightbearing restrictions. Goal status: INITIAL  2.  Patient will increase FOTO score to equal to or greater than  50    to demonstrate statistically significant improvement in mobility and quality of life.  Baseline: FOTO: 40 Goal status: INITIAL   3.  Patient will increase Berg Balance score by > 6 points to demonstrate decreased fall risk during functional activities. Baseline: Not performed due to time constraints. Goal status: INITIAL   4.  Patient will reduce timed up and go to <11 seconds to reduce fall risk and demonstrate improved transfer/gait ability. Baseline: 11.39 sec Goal status: INITIAL  5.  Patient will increase 10 meter walk test to >1.78m/s as to improve gait speed for better community ambulation and to reduce fall risk. Baseline: 18.59 sec - 0.54 m/s Goal status: INITIAL  6.  Patient will increase six minute walk test distance to >1000 for progression to community ambulator and improve gait ability Baseline: Not performed at this time and will perform at later date. Goal status: INITIAL     PLAN:  PT FREQUENCY: 2x/week  PT DURATION: 12 weeks  PLANNED INTERVENTIONS: Therapeutic exercises, Therapeutic activity, Neuromuscular re-education, Balance training, Gait training, Patient/Family education, Self Care, Joint mobilization, Stair training, DME instructions, Aquatic Therapy, Dry Needling, Electrical stimulation, Cryotherapy, Moist heat, scar mobilization, Ultrasound, Parrafin, Fluidotherapy, and Manual therapy  PLAN FOR NEXT SESSION:   Continue with weightbearing if tolerated. Continue with manual therapy for ROM and pain relief. Add progressive LE strengthening as appropriate.    Delphia Grates. Fairly IV, PT, DPT Physical Therapist- [facility]  02/20/22, 2:39 PM

## 2022-02-26 ENCOUNTER — Ambulatory Visit: Payer: Medicare Other

## 2022-02-26 DIAGNOSIS — R262 Difficulty in walking, not elsewhere classified: Secondary | ICD-10-CM

## 2022-02-26 DIAGNOSIS — R52 Pain, unspecified: Secondary | ICD-10-CM

## 2022-02-26 DIAGNOSIS — M25572 Pain in left ankle and joints of left foot: Secondary | ICD-10-CM

## 2022-02-26 DIAGNOSIS — M6281 Muscle weakness (generalized): Secondary | ICD-10-CM

## 2022-02-26 DIAGNOSIS — R269 Unspecified abnormalities of gait and mobility: Secondary | ICD-10-CM

## 2022-02-26 DIAGNOSIS — M25672 Stiffness of left ankle, not elsewhere classified: Secondary | ICD-10-CM

## 2022-02-26 NOTE — Therapy (Signed)
OUTPATIENT PHYSICAL THERAPY LOWER EXTREMITY TREATMENT   Patient Name: Hannah Tucker MRN: [id number] DOB:[birth year], 81 y.o., female Today's Date: 02/26/2022  END OF SESSION:  PT End of Session - 02/26/22 1302     Visit Number 9    Number of Visits 24    Date for PT Re-Evaluation 04/21/22    Authorization Type Medicare A&B Primary    PT Start Time 1301    PT Stop Time 1345    PT Time Calculation (min) 44 min    Equipment Utilized During Treatment Gait belt    Activity Tolerance Patient tolerated treatment well;No increased pain    Behavior During Therapy WFL for tasks assessed/performed                 Past Medical History:  Diagnosis Date   Arthritis    Complication of anesthesia    nausea   Dyspnea    with exertion   Family history of adverse reaction to anesthesia    PONV- MOM   Hypertension    Hypothyroidism    PONV (postoperative nausea and vomiting)    Sleep apnea    uses CPAP nightly   Stress incontinence    Valvular insufficiency, mitral    Past Surgical History:  Procedure Laterality Date   ANKLE ARTHROSCOPY WITH ARTHRODESIS Left 12/05/2021   Procedure: REPAIR OF TIBIAL NONUNION WITH AUTOGRAFT, TIBIOTALAR ARTHRODESIS;  Surgeon: Terance Hart, MD;  Location: [facility] OR;  Service: Orthopedics;  Laterality: Left;   APPLICATION OF WOUND VAC Right 01/04/2021   Procedure: APPLICATION OF WOUND VAC;  Surgeon: Myrene Galas, MD;  Location: [facility];  Service: Orthopedics;  Laterality: Right;   BREAST CYST ASPIRATION Bilateral    neg   BREAST EXCISIONAL BIOPSY Right    1970's   CATARACT EXTRACTION W/ INTRAOCULAR LENS  IMPLANT, BILATERAL Bilateral    COLONOSCOPY     DILATION AND CURETTAGE OF UTERUS     HARDWARE REMOVAL Left 12/05/2021   Procedure: LEFT ANKLE DEEP HARDWARE REMOVAL, MEDIAL AND LATERAL;  Surgeon: Terance Hart, MD;  Location: [facility];  Service: Orthopedics;  Laterality: Left;  LENGTH OF SURGERY: 150 MINUTES   I & D EXTREMITY Left 01/04/2021    Procedure: IRRIGATION AND DEBRIDEMENT LEFT ANKLE;  Surgeon: Myrene Galas, MD;  Location: [facility];  Service: Orthopedics;  Laterality: Left;   I & D EXTREMITY Left 01/07/2021   Procedure: IRRIGATION AND DEBRIDEMENT EXTREMITY, Left ankle;  Surgeon: Myrene Galas, MD;  Location: [facility] OR;  Service: Orthopedics;  Laterality: Left;   KNEE ARTHROPLASTY Left 11/24/2016   Procedure: COMPUTER ASSISTED TOTAL KNEE ARTHROPLASTY;  Surgeon: Donato Heinz, MD;  Location: [facility];  Service: Orthopedics;  Laterality: Left;   KNEE ARTHROPLASTY Right 12/31/2020   Procedure: COMPUTER ASSISTED TOTAL KNEE ARTHROPLASTY;  Surgeon: Donato Heinz, MD;  Location: [facility];  Service: Orthopedics;  Laterality: Right;   KNEE ARTHROSCOPY Left    KNEE ARTHROSCOPY Left 07/18/2015   Procedure: ARTHROSCOPY KNEE WITH MEDIAL COMPARTMENTAL MENICUS CHONDROPLASTY;  Surgeon: Donato Heinz, MD;  Location: [facility];  Service: Orthopedics;  Laterality: Left;   KNEE ARTHROSCOPY WITH LATERAL MENISECTOMY Left 07/18/2015   Procedure: LEFT KNEE ARTHROSCOPY WITH PARTIAL LATERAL MENISECTOMY GRADE 4 CHONDROMYLASIA LATERAL TIBIAL PLATEAU;  Surgeon: Donato Heinz, MD;  Location: [facility];  Service: Orthopedics;  Laterality: Left;   ORIF ANKLE FRACTURE Left 01/04/2021   Procedure: OPEN REDUCTION INTERNAL FIXATION (ORIF) ANKLE FRACTURE;  Surgeon: Myrene Galas, MD;  Location: [facility];  Service: Orthopedics;  Laterality: Left;   ORIF ANKLE FRACTURE Left 01/07/2021   Procedure: SCREW REMOVAL ANKLE REMOVAL OF WOUND VAC LEFT LEG;  Surgeon: Myrene GalasHandy, Michael, MD;  Location: [facility];  Service: Orthopedics;  Laterality: Left;   Patient Active Problem List   Diagnosis Date Noted   Arthritis of left ankle 12/05/2021   Sleep disturbance    Acute blood loss anemia 01/18/2021   Hypokalemia 01/18/2021   Constipation 01/18/2021   Trauma 01/08/2021   Open left ankle fracture, sequela 01/03/2021   Essential hypertension 01/03/2021   Hypothyroidism 01/03/2021    Total knee replacement status 12/31/2020   Family history of heart disease 04/12/2018   History of total knee arthroplasty, left 11/24/2016   Adult idiopathic generalized osteoporosis 03/01/2015   OSA on CPAP 01/30/2014    PCP: Bethann PunchesMiller, Mark, MD  REFERRING PROVIDER: Terance HartAdair, Christopher R, MD  REFERRING DIAG: Left 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  Pt notes that she is hoping to have a family get together every month.     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 PMH includes HTN, valvular insufficiency, SUI.    PAIN:  Are you having pain? Yes: NPRS scale: 1/10 Pain location: 1/10 in ankle, 2/10 in the L knee Pain description: soreness Aggravating factors: Being on the knee scooter is more problematic on the surgically fixed knee.  PRECAUTIONS: Other: NWB on L LE for first 8 weeks (01/30/22)  WEIGHT BEARING RESTRICTIONS: Yes Non-weight bearing until the end of this week, 01/30/22  FALLS:  Has patient fallen in last 6 months? No  LIVING ENVIRONMENT: Lives with: lives with their spouse Lives in: House/apartment Stairs: Yes: Internal: 15 steps; on left going up and External: 4 steps; bilateral but cannot reach both Has following equipment at home: Single point cane, Walker - 2 wheeled, Environmental consultantWalker - 4 wheeled, Crutches, Tour managerhower bench,  bed side commode, and Knee Scooter  OCCUPATION: Retired  PLOF: Independent  PATIENT GOALS: Pt wants to be able to go up/down the flight of steps.  NEXT MD VISIT: 02/26/22  OBJECTIVE:   DIAGNOSTIC FINDINGS:   CLINICAL DATA:  Fluoroscopic assistance for revision of internal fixation in left ankle   EXAM: LEFT ANKLE COMPLETE - 3+ VIEW   COMPARISON:  CT done on 09/24/2021   FINDINGS: There is interval placement of a new longer screw transfixing the medial malleolus. There is a new metallic plate along the anterior margin of distal tibia anchored to tibia and talus. Fluoroscopic time 1 minute and 11 seconds. Radiation dose 1.46 mGy.   IMPRESSION: Fluoroscopic assistance was provided for revision of internal fixation in left ankle.  PATIENT SURVEYS:  FOTO 40/50  COGNITION: Overall cognitive status: Within functional limits for tasks assessed     SENSATION: WFL  PALPATION: No tenderness surrounding the ankle  LOWER EXTREMITY ROM:  Active ROM Right eval Left eval  Hip flexion  Hip extension    Hip abduction    Hip adduction    Hip internal rotation    Hip external rotation    Knee flexion    Knee extension    Ankle dorsiflexion  2 deg  Ankle plantarflexion  13 deg  Ankle inversion  7 deg  Ankle eversion  3 deg   (Blank rows = not tested)  LOWER EXTREMITY MMT:  MMT Right eval Left eval  Hip flexion    Hip extension    Hip abduction    Hip adduction    Hip internal rotation    Hip external rotation    Knee flexion    Knee extension    Ankle dorsiflexion    Ankle plantarflexion    Ankle inversion    Ankle eversion     (Blank rows = not tested)   LOWER EXTREMITY SPECIAL TESTS:   Ankle special tests: Not performed at this time   FUNCTIONAL TESTS:   5 times sit to stand: 19.41 sec Timed up and go (TUG): 18.25 sec 10 meter walk test: 18.54 sec Dynamic Gait Index: Not performed due to weightbearing restrictions.   GAIT: Distance  walked: Not performed due to weightbearing restrictions.    TODAY'S TREATMENT:   DATE: 02/26/22   There.Ex:  Active ankle ROM in long sit position- Ankle DF/PF/IV/EV x 12 reps each direction.   Ambulation around the gym 2 x 150 feet with RW for UE assistance. Minimal step through gait.   Seated ankles ABC's, x1 full set   Manual:   STM Left LE along all incision lines (medial/anterior/lateral) Left LE. 2 minutes total   STM to plantar fascia with some taut bands noted but patient reports not as painful today. 1 minute   L foot great toe extension stretch: 3x30 sec   Metatarsal fan mobilization for pain modulation and improved ROM of the metatarsals, 2x30 sec bouts each segment  Other mobilization deferred due to pt reporting MD did not want forceful mobilization into plantarflexion/dorsiflexion.  PATIENT EDUCATION:  Education details: Pt educated on role of PT and services provided during current POC, along with prognosis and information about the clinic.  Person educated: Patient and Spouse Education method: Explanation Education comprehension: verbalized understanding  HOME EXERCISE PROGRAM:  Access Code: [email or web address] URL: [email or web address] Date: 01/27/2022 Prepared by: Tomasa Hose  Exercises - Towel Scrunches  - 1 x daily - 7 x weekly - 3 sets - 10 reps - Seated Heel Raise  - 1 x daily - 7 x weekly - 3 sets - 10 reps - Seated Long Arc Quad  - 1 x daily - 7 x weekly - 3 sets - 10 reps - Ankle Inversion Eversion Towel Slide  - 1 x daily - 7 x weekly - 3 sets - 10 reps - Arch Lifting  - 1 x daily - 7 x weekly - 3 sets - 10 reps   ASSESSMENT:  CLINICAL IMPRESSION:  Pt relayed information from MD that therapist is not to perform mobilization or have the patient perform exercises that would place the pt into plantarflexion or dorsiflexion with forcible contraction as her foot is still not ready for these movements and has not had the time to heal  properly.  Pt did note that he cleared her to wean from the boot and was able to walk with use of a shoe.  Therapist called clinic to verify information at this time to prevent any unwarranted complications to the ankle, however MD was  not available and therapist will be awaiting a phone call or Epic message for clarification.   Pt will continue to benefit from skilled therapy to address remaining deficits in order to improve overall QoL and return to PLOF.       OBJECTIVE IMPAIRMENTS: Abnormal gait, decreased balance, decreased mobility, difficulty walking, decreased ROM, decreased strength, hypomobility, impaired flexibility, and pain.   ACTIVITY LIMITATIONS: standing, squatting, stairs, transfers, bed mobility, bathing, dressing, and locomotion level  PARTICIPATION LIMITATIONS: meal prep, cleaning, laundry, driving, shopping, community activity, yard work, and church  PERSONAL FACTORS: Age, Past/current experiences, Time since onset of injury/illness/exacerbation, and 3+ comorbidities: arthritis, HTN, generalized osteoporosis  are also affecting patient's functional outcome.   REHAB POTENTIAL: Good  CLINICAL DECISION MAKING: Stable/uncomplicated  EVALUATION COMPLEXITY: Moderate   GOALS: Goals reviewed with patient? Yes  SHORT TERM GOALS: Target date: 02/24/2022    Pt will be independent with HEP in order to demonstrate increased ability to perform tasks related to occupation/hobbies. Baseline: Pt given HEP at initial evaluation Goal status: INITIAL   LONG TERM GOALS: Target date: 04/21/2022  1.  Patient (> 40 years old) will complete five times sit to stand test in < 15 seconds indicating an increased LE strength and improved balance. Baseline: Not performed due to weightbearing restrictions. Goal status: INITIAL  2.  Patient will increase FOTO score to equal to or greater than  50   to demonstrate statistically significant improvement in mobility and quality of life.   Baseline: FOTO: 40 Goal status: INITIAL   3.  Patient will increase Berg Balance score by > 6 points to demonstrate decreased fall risk during functional activities. Baseline: Not performed due to time constraints. Goal status: INITIAL   4.  Patient will reduce timed up and go to <11 seconds to reduce fall risk and demonstrate improved transfer/gait ability. Baseline: 11.39 sec Goal status: INITIAL  5.  Patient will increase 10 meter walk test to >1.71m/s as to improve gait speed for better community ambulation and to reduce fall risk. Baseline: 18.59 sec - 0.54 m/s Goal status: INITIAL  6.  Patient will increase six minute walk test distance to >1000 for progression to community ambulator and improve gait ability Baseline: Not performed at this time and will perform at later date. Goal status: INITIAL     PLAN:  PT FREQUENCY: 2x/week  PT DURATION: 12 weeks  PLANNED INTERVENTIONS: Therapeutic exercises, Therapeutic activity, Neuromuscular re-education, Balance training, Gait training, Patient/Family education, Self Care, Joint mobilization, Stair training, DME instructions, Aquatic Therapy, Dry Needling, Electrical stimulation, Cryotherapy, Moist heat, scar mobilization, Ultrasound, Parrafin, Fluidotherapy, and Manual therapy  PLAN FOR NEXT SESSION:   Continue with weightbearing if tolerated. Continue with manual therapy for ROM and pain relief. Add progressive LE strengthening as appropriate.    Nolon Bussing, PT, DPT Physical Therapist- [facility]  02/26/22, 4:16 PM

## 2022-03-05 ENCOUNTER — Ambulatory Visit: Payer: Medicare Other | Attending: Orthopaedic Surgery

## 2022-03-05 DIAGNOSIS — R269 Unspecified abnormalities of gait and mobility: Secondary | ICD-10-CM

## 2022-03-05 DIAGNOSIS — M25672 Stiffness of left ankle, not elsewhere classified: Secondary | ICD-10-CM | POA: Insufficient documentation

## 2022-03-05 DIAGNOSIS — M6281 Muscle weakness (generalized): Secondary | ICD-10-CM | POA: Insufficient documentation

## 2022-03-05 DIAGNOSIS — R52 Pain, unspecified: Secondary | ICD-10-CM | POA: Diagnosis present

## 2022-03-05 DIAGNOSIS — R262 Difficulty in walking, not elsewhere classified: Secondary | ICD-10-CM | POA: Diagnosis present

## 2022-03-05 DIAGNOSIS — M25572 Pain in left ankle and joints of left foot: Secondary | ICD-10-CM | POA: Insufficient documentation

## 2022-03-05 NOTE — Therapy (Signed)
OUTPATIENT PHYSICAL THERAPY LOWER EXTREMITY TREATMENT/Physical Therapy Progress Note   Dates of reporting period  01/27/2022   to   03/05/2022    Patient Name: Hannah Tucker MRN: [id number] DOB:[birth year], 82 y.o., female Today's Date: 03/06/2022  END OF SESSION:  PT End of Session - 03/05/22 0626     Visit Number 10    Number of Visits 24    Date for PT Re-Evaluation 04/21/22    Authorization Type Medicare A&B Primary    Progress Note Due on Visit 20    PT Start Time 1103    PT Stop Time 1138    PT Time Calculation (min) 35 min    Equipment Utilized During Treatment Gait belt    Activity Tolerance Patient tolerated treatment well;No increased pain    Behavior During Therapy WFL for tasks assessed/performed                  Past Medical History:  Diagnosis Date   Arthritis    Complication of anesthesia    nausea   Dyspnea    with exertion   Family history of adverse reaction to anesthesia    PONV- MOM   Hypertension    Hypothyroidism    PONV (postoperative nausea and vomiting)    Sleep apnea    uses CPAP nightly   Stress incontinence    Valvular insufficiency, mitral    Past Surgical History:  Procedure Laterality Date   ANKLE ARTHROSCOPY WITH ARTHRODESIS Left 12/05/2021   Procedure: REPAIR OF TIBIAL NONUNION WITH AUTOGRAFT, TIBIOTALAR ARTHRODESIS;  Surgeon: Terance Hart, MD;  Location: [facility];  Service: Orthopedics;  Laterality: Left;   APPLICATION OF WOUND VAC Right 01/04/2021   Procedure: APPLICATION OF WOUND VAC;  Surgeon: Myrene Galas, MD;  Location: [facility];  Service: Orthopedics;  Laterality: Right;   BREAST CYST ASPIRATION Bilateral    neg   BREAST EXCISIONAL BIOPSY Right    1970's   CATARACT EXTRACTION W/ INTRAOCULAR LENS  IMPLANT, BILATERAL Bilateral    COLONOSCOPY     DILATION AND CURETTAGE OF UTERUS     HARDWARE REMOVAL Left 12/05/2021   Procedure: LEFT ANKLE DEEP HARDWARE REMOVAL, MEDIAL AND LATERAL;  Surgeon: Terance Hart, MD;   Location: [facility];  Service: Orthopedics;  Laterality: Left;  LENGTH OF SURGERY: 150 MINUTES   I & D EXTREMITY Left 01/04/2021   Procedure: IRRIGATION AND DEBRIDEMENT LEFT ANKLE;  Surgeon: Myrene Galas, MD;  Location: [facility];  Service: Orthopedics;  Laterality: Left;   I & D EXTREMITY Left 01/07/2021   Procedure: IRRIGATION AND DEBRIDEMENT EXTREMITY, Left ankle;  Surgeon: Myrene Galas, MD;  Location: [facility];  Service: Orthopedics;  Laterality: Left;   KNEE ARTHROPLASTY Left 11/24/2016   Procedure: COMPUTER ASSISTED TOTAL KNEE ARTHROPLASTY;  Surgeon: Donato Heinz, MD;  Location: [facility];  Service: Orthopedics;  Laterality: Left;   KNEE ARTHROPLASTY Right 12/31/2020   Procedure: COMPUTER ASSISTED TOTAL KNEE ARTHROPLASTY;  Surgeon: Donato Heinz, MD;  Location: [facility];  Service: Orthopedics;  Laterality: Right;   KNEE ARTHROSCOPY Left    KNEE ARTHROSCOPY Left 07/18/2015   Procedure: ARTHROSCOPY KNEE WITH MEDIAL COMPARTMENTAL MENICUS CHONDROPLASTY;  Surgeon: Donato Heinz, MD;  Location: [facility];  Service: Orthopedics;  Laterality: Left;   KNEE ARTHROSCOPY WITH LATERAL MENISECTOMY Left 07/18/2015   Procedure: LEFT KNEE ARTHROSCOPY WITH PARTIAL LATERAL MENISECTOMY GRADE 4 CHONDROMYLASIA LATERAL TIBIAL PLATEAU;  Surgeon: Donato Heinz, MD;  Location: [facility];  Service: Orthopedics;  Laterality:  Left;   ORIF ANKLE FRACTURE Left 01/04/2021   Procedure: OPEN REDUCTION INTERNAL FIXATION (ORIF) ANKLE FRACTURE;  Surgeon: Myrene Galas, MD;  Location: [facility];  Service: Orthopedics;  Laterality: Left;   ORIF ANKLE FRACTURE Left 01/07/2021   Procedure: SCREW REMOVAL ANKLE REMOVAL OF WOUND VAC LEFT LEG;  Surgeon: Myrene Galas, MD;  Location: [facility];  Service: Orthopedics;  Laterality: Left;   Patient Active Problem List   Diagnosis Date Noted   Arthritis of left ankle 12/05/2021   Sleep disturbance    Acute blood loss anemia 01/18/2021   Hypokalemia 01/18/2021   Constipation 01/18/2021   Trauma  01/08/2021   Open left ankle fracture, sequela 01/03/2021   Essential hypertension 01/03/2021   Hypothyroidism 01/03/2021   Total knee replacement status 12/31/2020   Family history of heart disease 04/12/2018   History of total knee arthroplasty, left 11/24/2016   Adult idiopathic generalized osteoporosis 03/01/2015   OSA on CPAP 01/30/2014    PCP: Bethann Punches, MD  REFERRING PROVIDER: Terance Hart, MD  REFERRING DIAG: Left tibiotalar arthrodesis with repair of tibial nonunion and hardware removal, 12/06/21  THERAPY DIAG:  Pain in left ankle and joints of left foot  Abnormality of gait and mobility  Stiffness of left ankle, not elsewhere classified  Muscle weakness (generalized)  Difficulty in walking, not elsewhere classified  Rationale for Evaluation and Treatment: Rehabilitation  ONSET DATE: 12/06/21  SUBJECTIVE:   SUBJECTIVE STATEMENT:  Pt reports she was doing okay until this morning- banged her boot into something with some ankle soreness. Reports she brought in her old ASO brace and states it was uncomfortable in the past but willing to try it again as she states her MD told her she could walk without boot with brace on or try to just walk in tennis shoes.  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 PMH includes HTN, valvular insufficiency, SUI.    PAIN:  Are you having pain? Yes: NPRS scale: 1/10 Pain location: 1/10 in ankle, 2/10 in the L knee Pain description: soreness Aggravating factors: Being on the knee scooter is more problematic on the surgically fixed knee.  PRECAUTIONS: Fall  WEIGHT BEARING RESTRICTIONS: Other: Now Weight bearing as tolerated - current using a walking boot  FALLS:  Has patient fallen in last 6 months? No  LIVING ENVIRONMENT: Lives with: lives  with their spouse Lives in: House/apartment Stairs: Yes: Internal: 15 steps; on left going up and External: 4 steps; bilateral but cannot reach both Has following equipment at home: Single point cane, Walker - 2 wheeled, Environmental consultant - 4 wheeled, Crutches, Tour manager, bed side commode, and Knee Scooter  OCCUPATION: Retired  PLOF: Independent  PATIENT GOALS: Pt wants to be able to go up/down the flight of steps.  NEXT MD VISIT:   OBJECTIVE:   DIAGNOSTIC FINDINGS:   CLINICAL DATA:  Fluoroscopic assistance for revision of internal fixation in left ankle   EXAM: LEFT ANKLE COMPLETE - 3+ VIEW   COMPARISON:  CT done on 09/24/2021   FINDINGS: There is interval placement of a new longer screw transfixing the medial malleolus. There is a new metallic plate along the anterior margin of distal tibia anchored to tibia and talus. Fluoroscopic time 1 minute and 11 seconds. Radiation dose 1.46 mGy.   IMPRESSION: Fluoroscopic assistance was provided for revision  of internal fixation in left ankle.  PATIENT SURVEYS:  FOTO 40/50  COGNITION: Overall cognitive status: Within functional limits for tasks assessed     SENSATION: WFL  PALPATION: No tenderness surrounding the ankle  LOWER EXTREMITY ROM:  Active ROM Right eval Left eval  Hip flexion    Hip extension    Hip abduction    Hip adduction    Hip internal rotation    Hip external rotation    Knee flexion    Knee extension    Ankle dorsiflexion  2 deg  Ankle plantarflexion  13 deg  Ankle inversion  7 deg  Ankle eversion  3 deg   (Blank rows = not tested)  LOWER EXTREMITY MMT:  MMT Right eval Left eval  Hip flexion    Hip extension    Hip abduction    Hip adduction    Hip internal rotation    Hip external rotation    Knee flexion    Knee extension    Ankle dorsiflexion    Ankle plantarflexion    Ankle inversion    Ankle eversion     (Blank rows = not tested)   LOWER EXTREMITY SPECIAL TESTS:   Ankle  special tests: Not performed at this time   FUNCTIONAL TESTS:   5 times sit to stand: 19.41 sec Timed up and go (TUG): 18.25 sec 10 meter walk test: 18.54 sec Dynamic Gait Index: Not performed due to weightbearing restrictions.   GAIT: Distance walked: Not performed due to weightbearing restrictions.    TODAY'S TREATMENT:   DATE: 03/06/22   There.Ex:  Gentle active ankle ROM in long sit position- Ankle DF/PF/IV/EV x 12 reps each direction.   Ambulation around the gym 2 x 150 feet with RW and use of ASO brace with Tennis shoe. Patient demonstrated short reciprocal steps with no significant limp today- On 1st trial stated some lower leg (anterior pain) at superior border of brace- Adjusted to make slightly looser and patient responded better with decreased soreness on 2nd lap.   Instructed in how to don/doff ASO for best fit with patient and husband today.   Reviewed seated or reclined general LE strengthening- SLR, Hip ABD/ADD, LAQ, heel slides- with expectation of performing up to 3 sets of 10 reps 2-3x/week. Patient able to verbalize and demonstrate understanding.   Deferred Manual Therapy as to unclear exactly what ortho is recommending at this time as well as any resistive ankle strengthening or weight bearing activities.    PATIENT EDUCATION:  Education details: Pt educated on role of PT and services provided during current POC, along with prognosis and information about the clinic.  Person educated: Patient and Spouse Education method: Explanation Education comprehension: verbalized understanding  HOME EXERCISE PROGRAM:  Access Code: [email or web address] URL: [email or web address] Date: 01/27/2022 Prepared by: Tomasa Hose  Exercises - Towel Scrunches  - 1 x daily - 7 x weekly - 3 sets - 10 reps - Seated Heel Raise  - 1 x daily - 7 x weekly - 3 sets - 10 reps - Seated Long Arc Quad  - 1 x daily - 7 x weekly - 3 sets - 10 reps - Ankle Inversion Eversion Towel  Slide  - 1 x daily - 7 x weekly - 3 sets - 10 reps - Arch Lifting  - 1 x daily - 7 x weekly - 3 sets - 10 reps   ASSESSMENT:  CLINICAL IMPRESSION:  Pt presents today for visit #10- progress visit yet unable to  test many goals secondary to unclear on MD recommendations based on last PT note. Patient was able to walk with less restrictive equipment- downgraded to just ASO brace with shoe today with positive results for short distance ambulation without increased pain. She requires some assist to don brace and adjust for max comfort. Reviewed general LE strengthening exercises and later placed another PC to ortho for clarification on any recommendation/precautions/restrictions so could add to chart (as Ortho did respond to last PT author request - but in secure chat and message did not save)- Awaiting call back. Patient's condition has the potential to improve in response to therapy. Maximum improvement is yet to be obtained. The anticipated improvement is attainable and reasonable in a generally predictable time. Pt will continue to benefit from skilled therapy to address remaining deficits in order to improve overall QoL and return to PLOF.       OBJECTIVE IMPAIRMENTS: Abnormal gait, decreased balance, decreased mobility, difficulty walking, decreased ROM, decreased strength, hypomobility, impaired flexibility, and pain.   ACTIVITY LIMITATIONS: standing, squatting, stairs, transfers, bed mobility, bathing, dressing, and locomotion level  PARTICIPATION LIMITATIONS: meal prep, cleaning, laundry, driving, shopping, community activity, yard work, and church  PERSONAL FACTORS: Age, Past/current experiences, Time since onset of injury/illness/exacerbation, and 3+ comorbidities: arthritis, HTN, generalized osteoporosis  are also affecting patient's functional outcome.   REHAB POTENTIAL: Good  CLINICAL DECISION MAKING: Stable/uncomplicated  EVALUATION COMPLEXITY: Moderate   GOALS: Goals reviewed  with patient? Yes  SHORT TERM GOALS: Target date: 02/24/2022    Pt will be independent with HEP in order to demonstrate increased ability to perform tasks related to occupation/hobbies. Baseline: Pt given HEP at initial evaluation; 03/05/2022- Patient verbalized compliance with HEP and able to return demonstration of ankle ROM and LE strenghthening. Review general LE strengthening with patient and husband. Goal status: Progressing   LONG TERM GOALS: Target date: 04/21/2022  1.  Patient (> 24 years old) will complete five times sit to stand test in < 15 seconds indicating an increased LE strength and improved balance. Baseline: Not performed due to weightbearing restrictions. 03/05/2022- Deferred - waiting to discuss any restrictions with MD Goal status: INITIAL  2.  Patient will increase FOTO score to equal to or greater than  50   to demonstrate statistically significant improvement in mobility and quality of life.  Baseline: FOTO: 40 Goal status: INITIAL   3.  Patient will increase Berg Balance score by > 6 points to demonstrate decreased fall risk during functional activities. Baseline: Not performed due to time constraints. Goal status: INITIAL   4.  Patient will reduce timed up and go to <11 seconds to reduce fall risk and demonstrate improved transfer/gait ability. Baseline: 11.39 sec Goal status: INITIAL  5.  Patient will increase 10 meter walk test to >1.48m/s as to improve gait speed for better community ambulation and to reduce fall risk. Baseline: 18.59 sec - 0.54 m/s Goal status: INITIAL  6.  Patient will increase six minute walk test distance to >1000 for progression to community ambulator and improve gait ability Baseline: Not performed at this time and will perform at later date. Goal status: INITIAL     PLAN:  PT FREQUENCY: 2x/week  PT DURATION: 12 weeks  PLANNED INTERVENTIONS: Therapeutic exercises, Therapeutic activity, Neuromuscular re-education, Balance  training, Gait training, Patient/Family education, Self Care, Joint mobilization, Stair training, DME instructions, Aquatic Therapy, Dry Needling, Electrical stimulation, Cryotherapy, Moist heat, scar mobilization, Ultrasound, Parrafin, Fluidotherapy, and Manual therapy  PLAN FOR NEXT SESSION:  Continue with weightbearing if tolerated. Continue with manual therapy for ROM and pain relief. Add progressive LE strengthening as appropriate.    Louis Meckel, PT Physical Therapist- [facility]  03/06/22, 6:46 AM

## 2022-03-10 ENCOUNTER — Ambulatory Visit: Payer: Medicare Other

## 2022-03-10 DIAGNOSIS — R262 Difficulty in walking, not elsewhere classified: Secondary | ICD-10-CM

## 2022-03-10 DIAGNOSIS — M25672 Stiffness of left ankle, not elsewhere classified: Secondary | ICD-10-CM

## 2022-03-10 DIAGNOSIS — R269 Unspecified abnormalities of gait and mobility: Secondary | ICD-10-CM

## 2022-03-10 DIAGNOSIS — R52 Pain, unspecified: Secondary | ICD-10-CM

## 2022-03-10 DIAGNOSIS — M6281 Muscle weakness (generalized): Secondary | ICD-10-CM

## 2022-03-10 DIAGNOSIS — M25572 Pain in left ankle and joints of left foot: Secondary | ICD-10-CM

## 2022-03-10 NOTE — Therapy (Signed)
OUTPATIENT PHYSICAL THERAPY LOWER EXTREMITY TREATMENT   Patient Name: Hannah Tucker MRN: [id number] DOB:03-12-1940, 82 y.o., female Today's Date: 03/10/2022  END OF SESSION:  PT End of Session - 03/10/22 1637     Visit Number 11    Number of Visits 24    Date for PT Re-Evaluation 04/21/22    Authorization Type Medicare A&B Primary    Progress Note Due on Visit 20    PT Start Time 1308    PT Stop Time 1344    PT Time Calculation (min) 36 min    Equipment Utilized During Treatment Gait belt    Activity Tolerance Patient tolerated treatment well;No increased pain    Behavior During Therapy WFL for tasks assessed/performed                   Past Medical History:  Diagnosis Date   Arthritis    Complication of anesthesia    nausea   Dyspnea    with exertion   Family history of adverse reaction to anesthesia    PONV- MOM   Hypertension    Hypothyroidism    PONV (postoperative nausea and vomiting)    Sleep apnea    uses CPAP nightly   Stress incontinence    Valvular insufficiency, mitral    Past Surgical History:  Procedure Laterality Date   ANKLE ARTHROSCOPY WITH ARTHRODESIS Left 12/05/2021   Procedure: REPAIR OF TIBIAL NONUNION WITH AUTOGRAFT, TIBIOTALAR ARTHRODESIS;  Surgeon: Terance Hart, MD;  Location: [facility] OR;  Service: Orthopedics;  Laterality: Left;   APPLICATION OF WOUND VAC Right 01/04/2021   Procedure: APPLICATION OF WOUND VAC;  Surgeon: Myrene Galas, MD;  Location: [facility] OR;  Service: Orthopedics;  Laterality: Right;   BREAST CYST ASPIRATION Bilateral    neg   BREAST EXCISIONAL BIOPSY Right    1970's   CATARACT EXTRACTION W/ INTRAOCULAR LENS  IMPLANT, BILATERAL Bilateral    COLONOSCOPY     DILATION AND CURETTAGE OF UTERUS     HARDWARE REMOVAL Left 12/05/2021   Procedure: LEFT ANKLE DEEP HARDWARE REMOVAL, MEDIAL AND LATERAL;  Surgeon: Terance Hart, MD;  Location: [facility] OR;  Service: Orthopedics;  Laterality: Left;  LENGTH OF SURGERY: 150  MINUTES   I & D EXTREMITY Left 01/04/2021   Procedure: IRRIGATION AND DEBRIDEMENT LEFT ANKLE;  Surgeon: Myrene Galas, MD;  Location: [facility] OR;  Service: Orthopedics;  Laterality: Left;   I & D EXTREMITY Left 01/07/2021   Procedure: IRRIGATION AND DEBRIDEMENT EXTREMITY, Left ankle;  Surgeon: Myrene Galas, MD;  Location: [facility] OR;  Service: Orthopedics;  Laterality: Left;   KNEE ARTHROPLASTY Left 11/24/2016   Procedure: COMPUTER ASSISTED TOTAL KNEE ARTHROPLASTY;  Surgeon: Donato Heinz, MD;  Location: [facility] ORS;  Service: Orthopedics;  Laterality: Left;   KNEE ARTHROPLASTY Right 12/31/2020   Procedure: COMPUTER ASSISTED TOTAL KNEE ARTHROPLASTY;  Surgeon: Donato Heinz, MD;  Location: [facility] ORS;  Service: Orthopedics;  Laterality: Right;   KNEE ARTHROSCOPY Left    KNEE ARTHROSCOPY Left 07/18/2015   Procedure: ARTHROSCOPY KNEE WITH MEDIAL COMPARTMENTAL MENICUS CHONDROPLASTY;  Surgeon: Donato Heinz, MD;  Location: [facility] ORS;  Service: Orthopedics;  Laterality: Left;   KNEE ARTHROSCOPY WITH LATERAL MENISECTOMY Left 07/18/2015   Procedure: LEFT KNEE ARTHROSCOPY WITH PARTIAL LATERAL MENISECTOMY GRADE 4 CHONDROMYLASIA LATERAL TIBIAL PLATEAU;  Surgeon: Donato Heinz, MD;  Location: [facility] ORS;  Service: Orthopedics;  Laterality: Left;   ORIF ANKLE FRACTURE Left 01/04/2021   Procedure: OPEN REDUCTION INTERNAL FIXATION (ORIF) ANKLE  FRACTURE;  Surgeon: Myrene Galas, MD;  Location: [facility] OR;  Service: Orthopedics;  Laterality: Left;   ORIF ANKLE FRACTURE Left 01/07/2021   Procedure: SCREW REMOVAL ANKLE REMOVAL OF WOUND VAC LEFT LEG;  Surgeon: Myrene Galas, MD;  Location: [facility] OR;  Service: Orthopedics;  Laterality: Left;   Patient Active Problem List   Diagnosis Date Noted   Arthritis of left ankle 12/05/2021   Sleep disturbance    Acute blood loss anemia 01/18/2021   Hypokalemia 01/18/2021   Constipation 01/18/2021   Trauma 01/08/2021   Open left ankle fracture, sequela 01/03/2021   Essential hypertension  01/03/2021   Hypothyroidism 01/03/2021   Total knee replacement status 12/31/2020   Family history of heart disease 04/12/2018   History of total knee arthroplasty, left 11/24/2016   Adult idiopathic generalized osteoporosis 03/01/2015   OSA on CPAP 01/30/2014    PCP: Bethann Punches, MD  REFERRING PROVIDER: Terance Hart, MD  REFERRING DIAG: Left tibiotalar arthrodesis with repair of tibial nonunion and hardware removal, 12/06/21  THERAPY DIAG:  Pain in left ankle and joints of left foot  Abnormality of gait and mobility  Stiffness of left ankle, not elsewhere classified  Muscle weakness (generalized)  Difficulty in walking, not elsewhere classified  Pain  Rationale for Evaluation and Treatment: Rehabilitation  ONSET DATE: 12/06/21  SUBJECTIVE:   SUBJECTIVE STATEMENT:  Pt reports she has been doing well overall. States she has tried her ASO brace with tennis shoe and working well so far. She does endorse some anterior lower limb pain on left LE.   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 PMH includes HTN, valvular insufficiency, SUI.    PAIN:  Are you having pain? Yes: NPRS scale: 2/10 Pain location: 2/10 lower leg superior to ankle Pain description: soreness Aggravating factors: Being on the knee scooter is more problematic on the surgically fixed knee.  PRECAUTIONS: Fall  WEIGHT BEARING RESTRICTIONS: Other: Now Weight bearing as tolerated - current using a walking boot  FALLS:  Has patient fallen in last 6 months? No  LIVING ENVIRONMENT: Lives with: lives with their spouse Lives in: House/apartment Stairs: Yes: Internal: 15 steps; on left going up and External: 4 steps; bilateral but cannot reach both Has following equipment at home: Single point cane, Walker - 2 wheeled,  Environmental consultant - 4 wheeled, Crutches, Tour manager, bed side commode, and Knee Scooter  OCCUPATION: Retired  PLOF: Independent  PATIENT GOALS: Pt wants to be able to go up/down the flight of steps.  NEXT MD VISIT:   OBJECTIVE:   DIAGNOSTIC FINDINGS:   CLINICAL DATA:  Fluoroscopic assistance for revision of internal fixation in left ankle   EXAM: LEFT ANKLE COMPLETE - 3+ VIEW   COMPARISON:  CT done on 09/24/2021   FINDINGS: There is interval placement of a new longer screw transfixing the medial malleolus. There is a new metallic plate along the anterior margin of distal tibia anchored to tibia and talus. Fluoroscopic time 1 minute and 11 seconds. Radiation dose 1.46 mGy.   IMPRESSION: Fluoroscopic assistance was provided for revision of internal fixation in left ankle.  PATIENT SURVEYS:  FOTO 40/50  COGNITION: Overall cognitive status: Within functional limits for tasks assessed     SENSATION: WFL  PALPATION: No tenderness surrounding the ankle  LOWER EXTREMITY ROM:  Active ROM Right eval  Left eval  Hip flexion    Hip extension    Hip abduction    Hip adduction    Hip internal rotation    Hip external rotation    Knee flexion    Knee extension    Ankle dorsiflexion  2 deg  Ankle plantarflexion  13 deg  Ankle inversion  7 deg  Ankle eversion  3 deg   (Blank rows = not tested)  LOWER EXTREMITY MMT:  MMT Right eval Left eval  Hip flexion    Hip extension    Hip abduction    Hip adduction    Hip internal rotation    Hip external rotation    Knee flexion    Knee extension    Ankle dorsiflexion    Ankle plantarflexion    Ankle inversion    Ankle eversion     (Blank rows = not tested)   LOWER EXTREMITY SPECIAL TESTS:   Ankle special tests: Not performed at this time   FUNCTIONAL TESTS:   5 times sit to stand: 19.41 sec Timed up and go (TUG): 18.25 sec 10 meter walk test: 18.54 sec Dynamic Gait Index: Not performed due to weightbearing  restrictions.   GAIT: Distance walked: Not performed due to weightbearing restrictions.    TODAY'S TREATMENT:   DATE: 03/10/22  *Per conversation with Nolon Bussing, PT, DPT (who spoke with representative of Dr. Susa Simmonds office- [place], [place] and stated to proceed as appropriate with painfree ROM and no forceful ROM.    There.Ex:  Gentle active ankle ROM in long sit position- Ankle DF/PF/IV/EV x 20 reps each direction.   Arch lift x 15 reps with (VC and visual demo)   Great toe ext- x 15 reps (max difficulty raising toe)   Toe yoga- Extending great toe and flexing other 4 toes x 15 reps  Toe scrunches on towel in sitting x 30 sec   Gentle DF/PF and IV/EV with left foot on dynadisc.   Seated Hip flex ABD/ADD up and over 1/2 spike ball x 10 reps LAQ- 10 reps BLE x 2 sets.     Weight bearing:   Static stand near support bar- dynamic lateral weight shift with TC for hip movement followed by some ant/post weight shift within painfree ROM.    PATIENT EDUCATION:  Education details: Pt educated on role of PT and services provided during current POC, along with prognosis and information about the clinic.  Person educated: Patient and Spouse Education method: Explanation Education comprehension: verbalized understanding  HOME EXERCISE PROGRAM:  Access Code: [email or web address] URL: [email or web address] Date: 01/27/2022 Prepared by: Tomasa Hose  Exercises - Towel Scrunches  - 1 x daily - 7 x weekly - 3 sets - 10 reps - Seated Heel Raise  - 1 x daily - 7 x weekly - 3 sets - 10 reps - Seated Long Arc Quad  - 1 x daily - 7 x weekly - 3 sets - 10 reps - Ankle Inversion Eversion Towel Slide  - 1 x daily - 7 x weekly - 3 sets - 10 reps - Arch Lifting  - 1 x daily - 7 x weekly - 3 sets - 10 reps   ASSESSMENT:  CLINICAL IMPRESSION:  Patient arrived to clinic walking using RW and left ASO brace/tennis shoe. She performed well today- able to walk around with use of  brace with only minimal pain in mid to distal lower leg with some palpable nodules. She denied any pain at rest or non-weight  bearing activities. She was able to perform some gentle ROM and later weight bearing without any increase in pain and will benefit from continued progression of therex to build her strength. Pt will continue to benefit from skilled therapy to address remaining deficits in order to improve overall QoL and return to PLOF.       OBJECTIVE IMPAIRMENTS: Abnormal gait, decreased balance, decreased mobility, difficulty walking, decreased ROM, decreased strength, hypomobility, impaired flexibility, and pain.   ACTIVITY LIMITATIONS: standing, squatting, stairs, transfers, bed mobility, bathing, dressing, and locomotion level  PARTICIPATION LIMITATIONS: meal prep, cleaning, laundry, driving, shopping, community activity, yard work, and church  PERSONAL FACTORS: Age, Past/current experiences, Time since onset of injury/illness/exacerbation, and 3+ comorbidities: arthritis, HTN, generalized osteoporosis  are also affecting patient's functional outcome.   REHAB POTENTIAL: Good  CLINICAL DECISION MAKING: Stable/uncomplicated  EVALUATION COMPLEXITY: Moderate   GOALS: Goals reviewed with patient? Yes  SHORT TERM GOALS: Target date: 02/24/2022    Pt will be independent with HEP in order to demonstrate increased ability to perform tasks related to occupation/hobbies. Baseline: Pt given HEP at initial evaluation; 03/05/2022- Patient verbalized compliance with HEP and able to return demonstration of ankle ROM and LE strenghthening. Review general LE strengthening with patient and husband. Goal status: Progressing   LONG TERM GOALS: Target date: 04/21/2022  1.  Patient (> 51 years old) will complete five times sit to stand test in < 15 seconds indicating an increased LE strength and improved balance. Baseline: Not performed due to weightbearing restrictions. 03/05/2022- Deferred -  waiting to discuss any restrictions with MD Goal status: INITIAL  2.  Patient will increase FOTO score to equal to or greater than  50   to demonstrate statistically significant improvement in mobility and quality of life.  Baseline: FOTO: 40 Goal status: INITIAL   3.  Patient will increase Berg Balance score by > 6 points to demonstrate decreased fall risk during functional activities. Baseline: Not performed due to time constraints. Goal status: INITIAL   4.  Patient will reduce timed up and go to <11 seconds to reduce fall risk and demonstrate improved transfer/gait ability. Baseline: 11.39 sec Goal status: INITIAL  5.  Patient will increase 10 meter walk test to >1.8m/s as to improve gait speed for better community ambulation and to reduce fall risk. Baseline: 18.59 sec - 0.54 m/s Goal status: INITIAL  6.  Patient will increase six minute walk test distance to >1000 for progression to community ambulator and improve gait ability Baseline: Not performed at this time and will perform at later date. Goal status: INITIAL     PLAN:  PT FREQUENCY: 2x/week  PT DURATION: 12 weeks  PLANNED INTERVENTIONS: Therapeutic exercises, Therapeutic activity, Neuromuscular re-education, Balance training, Gait training, Patient/Family education, Self Care, Joint mobilization, Stair training, DME instructions, Aquatic Therapy, Dry Needling, Electrical stimulation, Cryotherapy, Moist heat, scar mobilization, Ultrasound, Parrafin, Fluidotherapy, and Manual therapy  PLAN FOR NEXT SESSION:   Continue with weightbearing if tolerated. Continue with manual therapy for ROM and pain relief. Add progressive LE strengthening as appropriate.    Ollen Bowl, PT Physical Therapist- [facility]  03/10/22, 4:37 PM

## 2022-03-12 ENCOUNTER — Ambulatory Visit: Payer: Medicare Other

## 2022-03-12 DIAGNOSIS — M25572 Pain in left ankle and joints of left foot: Secondary | ICD-10-CM | POA: Diagnosis not present

## 2022-03-12 DIAGNOSIS — M25672 Stiffness of left ankle, not elsewhere classified: Secondary | ICD-10-CM

## 2022-03-12 DIAGNOSIS — R52 Pain, unspecified: Secondary | ICD-10-CM

## 2022-03-12 DIAGNOSIS — R262 Difficulty in walking, not elsewhere classified: Secondary | ICD-10-CM

## 2022-03-12 DIAGNOSIS — R269 Unspecified abnormalities of gait and mobility: Secondary | ICD-10-CM

## 2022-03-12 DIAGNOSIS — M6281 Muscle weakness (generalized): Secondary | ICD-10-CM

## 2022-03-12 NOTE — Therapy (Signed)
OUTPATIENT PHYSICAL THERAPY LOWER EXTREMITY TREATMENT   Patient Name: Hannah Tucker MRN: [id number] DOB:06-Apr-1940, 82 y.o., female Today's Date: 03/12/2022  END OF SESSION:  PT End of Session - 03/12/22 1433     Visit Number 12    Number of Visits 24    Date for PT Re-Evaluation 04/21/22    Authorization Type Medicare A&B Primary    Progress Note Due on Visit 20    PT Start Time 1433    PT Stop Time 1515    PT Time Calculation (min) 42 min    Equipment Utilized During Treatment Gait belt    Activity Tolerance Patient tolerated treatment well;No increased pain    Behavior During Therapy WFL for tasks assessed/performed                    Past Medical History:  Diagnosis Date   Arthritis    Complication of anesthesia    nausea   Dyspnea    with exertion   Family history of adverse reaction to anesthesia    PONV- MOM   Hypertension    Hypothyroidism    PONV (postoperative nausea and vomiting)    Sleep apnea    uses CPAP nightly   Stress incontinence    Valvular insufficiency, mitral    Past Surgical History:  Procedure Laterality Date   ANKLE ARTHROSCOPY WITH ARTHRODESIS Left 12/05/2021   Procedure: REPAIR OF TIBIAL NONUNION WITH AUTOGRAFT, TIBIOTALAR ARTHRODESIS;  Surgeon: Terance Hart, MD;  Location: [facility];  Service: Orthopedics;  Laterality: Left;   APPLICATION OF WOUND VAC Right 01/04/2021   Procedure: APPLICATION OF WOUND VAC;  Surgeon: Myrene Galas, MD;  Location: [facility];  Service: Orthopedics;  Laterality: Right;   BREAST CYST ASPIRATION Bilateral    neg   BREAST EXCISIONAL BIOPSY Right    1970's   CATARACT EXTRACTION W/ INTRAOCULAR LENS  IMPLANT, BILATERAL Bilateral    COLONOSCOPY     DILATION AND CURETTAGE OF UTERUS     HARDWARE REMOVAL Left 12/05/2021   Procedure: LEFT ANKLE DEEP HARDWARE REMOVAL, MEDIAL AND LATERAL;  Surgeon: Terance Hart, MD;  Location: [facility];  Service: Orthopedics;  Laterality: Left;  LENGTH OF SURGERY: 150  MINUTES   I & D EXTREMITY Left 01/04/2021   Procedure: IRRIGATION AND DEBRIDEMENT LEFT ANKLE;  Surgeon: Myrene Galas, MD;  Location: [facility];  Service: Orthopedics;  Laterality: Left;   I & D EXTREMITY Left 01/07/2021   Procedure: IRRIGATION AND DEBRIDEMENT EXTREMITY, Left ankle;  Surgeon: Myrene Galas, MD;  Location: [facility] OR;  Service: Orthopedics;  Laterality: Left;   KNEE ARTHROPLASTY Left 11/24/2016   Procedure: COMPUTER ASSISTED TOTAL KNEE ARTHROPLASTY;  Surgeon: Donato Heinz, MD;  Location: [facility];  Service: Orthopedics;  Laterality: Left;   KNEE ARTHROPLASTY Right 12/31/2020   Procedure: COMPUTER ASSISTED TOTAL KNEE ARTHROPLASTY;  Surgeon: Donato Heinz, MD;  Location: [facility];  Service: Orthopedics;  Laterality: Right;   KNEE ARTHROSCOPY Left    KNEE ARTHROSCOPY Left 07/18/2015   Procedure: ARTHROSCOPY KNEE WITH MEDIAL COMPARTMENTAL MENICUS CHONDROPLASTY;  Surgeon: Donato Heinz, MD;  Location: [facility];  Service: Orthopedics;  Laterality: Left;   KNEE ARTHROSCOPY WITH LATERAL MENISECTOMY Left 07/18/2015   Procedure: LEFT KNEE ARTHROSCOPY WITH PARTIAL LATERAL MENISECTOMY GRADE 4 CHONDROMYLASIA LATERAL TIBIAL PLATEAU;  Surgeon: Donato Heinz, MD;  Location: [facility];  Service: Orthopedics;  Laterality: Left;   ORIF ANKLE FRACTURE Left 01/04/2021   Procedure: OPEN REDUCTION INTERNAL FIXATION (ORIF)  ANKLE FRACTURE;  Surgeon: Altamese Robinson, MD;  Location: [place];  Service: Orthopedics;  Laterality: Left;   ORIF ANKLE FRACTURE Left 01/07/2021   Procedure: SCREW REMOVAL ANKLE REMOVAL OF WOUND VAC LEFT LEG;  Surgeon: Altamese Honcut, MD;  Location: [facility];  Service: Orthopedics;  Laterality: Left;   Patient Active Problem List   Diagnosis Date Noted   Arthritis of left ankle 12/05/2021   Sleep disturbance    Acute blood loss anemia 01/18/2021   Hypokalemia 01/18/2021   Constipation 01/18/2021   Trauma 01/08/2021   Open left ankle fracture, sequela 01/03/2021   Essential hypertension  01/03/2021   Hypothyroidism 01/03/2021   Total knee replacement status 12/31/2020   Family history of heart disease 04/12/2018   History of total knee arthroplasty, left 11/24/2016   Adult idiopathic generalized osteoporosis 03/01/2015   OSA on CPAP 01/30/2014    PCP: Emily Filbert, MD  REFERRING PROVIDER: Erle Crocker, MD  REFERRING DIAG: Left tibiotalar arthrodesis with repair of tibial nonunion and hardware removal, 12/06/21  THERAPY DIAG:  Pain in left ankle and joints of left foot  Abnormality of gait and mobility  Stiffness of left ankle, not elsewhere classified  Muscle weakness (generalized)  Difficulty in walking, not elsewhere classified  Pain  Rationale for Evaluation and Treatment: Rehabilitation  ONSET DATE: 12/06/21  SUBJECTIVE:   SUBJECTIVE STATEMENT:  Pt reports she is doing well and responded well to the stairs last visit.  Pt notes that she hasn't had any sharp pain.  Pt was able to ambulate around the great room and in the kitchen at home yesterday.    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 PMH includes HTN, valvular insufficiency, SUI.    PAIN:  Are you having pain? Yes: NPRS scale: 2/10 Pain location: 2/10 lower leg superior to ankle Pain description: soreness Aggravating factors: Being on the knee scooter is more problematic on the surgically fixed knee.  PRECAUTIONS: Fall  WEIGHT BEARING RESTRICTIONS: Other: Now Weight bearing as tolerated - current using a walking boot  FALLS:  Has patient fallen in last 6 months? No  LIVING ENVIRONMENT: Lives with: lives with their spouse Lives in: House/apartment Stairs: Yes: Internal: 15 steps; on left going up and External: 4 steps; bilateral but cannot reach both Has following equipment at home: Single point  cane, Walker - 2 wheeled, Environmental consultant - 4 wheeled, Crutches, Electronics engineer, bed side commode, and Knee Scooter  OCCUPATION: Retired  PLOF: Independent  PATIENT GOALS: Pt wants to be able to go up/down the flight of steps.  NEXT MD VISIT:   OBJECTIVE:   DIAGNOSTIC FINDINGS:   CLINICAL DATA:  Fluoroscopic assistance for revision of internal fixation in left ankle   EXAM: LEFT ANKLE COMPLETE - 3+ VIEW   COMPARISON:  CT done on 09/24/2021   FINDINGS: There is interval placement of a new longer screw transfixing the medial malleolus. There is a new metallic plate along the anterior margin of distal tibia anchored to tibia and talus. Fluoroscopic time 1 minute and 11 seconds. Radiation dose 1.46 mGy.   IMPRESSION: Fluoroscopic assistance was provided for revision of internal fixation in left ankle.  PATIENT SURVEYS:  FOTO 40/50  COGNITION: Overall cognitive status: Within functional limits for tasks assessed     SENSATION: WFL  PALPATION: No tenderness surrounding the ankle  LOWER EXTREMITY ROM:  Active ROM Right eval Left eval  Hip flexion    Hip extension    Hip abduction    Hip adduction    Hip internal rotation    Hip external rotation    Knee flexion    Knee extension    Ankle dorsiflexion  2 deg  Ankle plantarflexion  13 deg  Ankle inversion  7 deg  Ankle eversion  3 deg   (Blank rows = not tested)   LOWER EXTREMITY SPECIAL TESTS:   Ankle special tests: Not performed at this time   FUNCTIONAL TESTS:   5 times sit to stand: 19.41 sec Timed up and go (TUG): 18.25 sec 10 meter walk test: 18.54 sec Dynamic Gait Index: Not performed due to weightbearing restrictions.   GAIT: Distance walked: Not performed due to weightbearing restrictions.    TODAY'S TREATMENT:   DATE: 03/12/22   There.Ex:   Gentle DF/PF and IV/EV with left foot on half foam roller.  IV/EV while on towel for improved functional ROM, 2x10 each direction Arch lift, 2x15  reps with (VC and visual demo)  Great toe extension, 2x15 reps (continued difficulty with this task) Toe yoga (extending great toe and flexing other 4 toes), 2x15 reps Toe scrunches on towel in sitting 2x15 reps  UE supported lunges onto first step to gently increase dorsiflexion, 2x15 each LE leading  Generalize ambulation around the gym to improve functional ROM with lesser support required through the UE's.    PATIENT EDUCATION:  Education details: Pt educated on role of PT and services provided during current POC, along with prognosis and information about the clinic.  Person educated: Patient and Spouse Education method: Explanation Education comprehension: verbalized understanding  HOME EXERCISE PROGRAM:  Access Code: [email or web address] URL: [email or web address] Date: 01/27/2022 Prepared by: [place]  - 1 x daily - 7 x weekly - 3 sets - 10 reps - Seated Heel Raise  - 1 x daily - 7 x weekly - 3 sets - 10 reps - Seated Long Arc Quad  - 1 x daily - 7 x weekly - 3 sets - 10 reps - Ankle Inversion Eversion Towel Slide  - 1 x daily - 7 x weekly - 3 sets - 10 reps - Arch Lifting  - 1 x daily - 7 x weekly - 3 sets - 10 reps   ASSESSMENT:  CLINICAL IMPRESSION:  Pt is making good progress towards the goals set forth by therapist and pt.  Pt is able to achieve improved ROM and was given tasks to perform at home with lunges in order to improve her dorsiflexion for more normalized gait pattern.   Pt will continue to benefit from skilled therapy to address remaining deficits in order to improve overall QoL and return to PLOF.         OBJECTIVE IMPAIRMENTS: Abnormal gait, decreased balance, decreased mobility, difficulty walking, decreased ROM, decreased strength, hypomobility, impaired flexibility, and pain.   ACTIVITY LIMITATIONS: standing, squatting, stairs, transfers, bed mobility, bathing, dressing, and locomotion level  PARTICIPATION  LIMITATIONS: meal prep, cleaning, laundry, driving, shopping, community activity, yard work, and church  PERSONAL FACTORS: Age, Past/current experiences, Time since onset of injury/illness/exacerbation, and 3+ comorbidities: arthritis, HTN, generalized osteoporosis  are also affecting patient's functional outcome.   REHAB POTENTIAL: Good  CLINICAL DECISION MAKING: Stable/uncomplicated  EVALUATION COMPLEXITY: Moderate   GOALS: Goals reviewed with patient? Yes  SHORT TERM GOALS: Target date: 02/24/2022    Pt  will be independent with HEP in order to demonstrate increased ability to perform tasks related to occupation/hobbies. Baseline: Pt given HEP at initial evaluation; 03/05/2022- Patient verbalized compliance with HEP and able to return demonstration of ankle ROM and LE strenghthening. Review general LE strengthening with patient and husband. Goal status: Progressing   LONG TERM GOALS: Target date: 04/21/2022  1.  Patient (> 31 years old) will complete five times sit to stand test in < 15 seconds indicating an increased LE strength and improved balance. Baseline: Not performed due to weightbearing restrictions. 03/05/2022- Deferred - waiting to discuss any restrictions with MD Goal status: INITIAL  2.  Patient will increase FOTO score to equal to or greater than  50   to demonstrate statistically significant improvement in mobility and quality of life.  Baseline: FOTO: 40 Goal status: INITIAL   3.  Patient will increase Berg Balance score by > 6 points to demonstrate decreased fall risk during functional activities. Baseline: Not performed due to time constraints. Goal status: INITIAL   4.  Patient will reduce timed up and go to <11 seconds to reduce fall risk and demonstrate improved transfer/gait ability. Baseline: 11.39 sec Goal status: INITIAL  5.  Patient will increase 10 meter walk test to >1.37m/s as to improve gait speed for better community ambulation and to reduce fall  risk. Baseline: 18.59 sec - 0.54 m/s Goal status: INITIAL  6.  Patient will increase six minute walk test distance to >1000 for progression to community ambulator and improve gait ability Baseline: Not performed at this time and will perform at later date. Goal status: INITIAL     PLAN:  PT FREQUENCY: 2x/week  PT DURATION: 12 weeks  PLANNED INTERVENTIONS: Therapeutic exercises, Therapeutic activity, Neuromuscular re-education, Balance training, Gait training, Patient/Family education, Self Care, Joint mobilization, Stair training, DME instructions, Aquatic Therapy, Dry Needling, Electrical stimulation, Cryotherapy, Moist heat, scar mobilization, Ultrasound, Parrafin, Fluidotherapy, and Manual therapy  PLAN FOR NEXT SESSION:   Continue with weightbearing if tolerated. Continue with manual therapy for ROM and pain relief. Add progressive LE strengthening as appropriate.    Gwenlyn Saran, PT, DPT Physical Therapist- [facility]  03/12/22, 4:09 PM

## 2022-03-18 ENCOUNTER — Ambulatory Visit: Payer: Medicare Other

## 2022-03-18 DIAGNOSIS — M25572 Pain in left ankle and joints of left foot: Secondary | ICD-10-CM

## 2022-03-18 DIAGNOSIS — R52 Pain, unspecified: Secondary | ICD-10-CM

## 2022-03-18 DIAGNOSIS — R269 Unspecified abnormalities of gait and mobility: Secondary | ICD-10-CM

## 2022-03-18 DIAGNOSIS — R262 Difficulty in walking, not elsewhere classified: Secondary | ICD-10-CM

## 2022-03-18 DIAGNOSIS — M6281 Muscle weakness (generalized): Secondary | ICD-10-CM

## 2022-03-18 DIAGNOSIS — M25672 Stiffness of left ankle, not elsewhere classified: Secondary | ICD-10-CM

## 2022-03-18 NOTE — Therapy (Signed)
OUTPATIENT PHYSICAL THERAPY LOWER EXTREMITY TREATMENT   Patient Name: Hannah Tucker MRN: [id number] DOB:[birth year], 82 y.o., female Today's Date: 03/18/2022  END OF SESSION:  PT End of Session - 03/18/22 1105     Visit Number 13    Number of Visits 24    Date for PT Re-Evaluation 04/21/22    Authorization Type Medicare A&B Primary    Progress Note Due on Visit 20    PT Start Time 1105    PT Stop Time 1148    PT Time Calculation (min) 43 min    Equipment Utilized During Treatment Gait belt    Activity Tolerance Patient tolerated treatment well;No increased pain    Behavior During Therapy WFL for tasks assessed/performed                    Past Medical History:  Diagnosis Date   Arthritis    Complication of anesthesia    nausea   Dyspnea    with exertion   Family history of adverse reaction to anesthesia    PONV- MOM   Hypertension    Hypothyroidism    PONV (postoperative nausea and vomiting)    Sleep apnea    uses CPAP nightly   Stress incontinence    Valvular insufficiency, mitral    Past Surgical History:  Procedure Laterality Date   ANKLE ARTHROSCOPY WITH ARTHRODESIS Left 12/05/2021   Procedure: REPAIR OF TIBIAL NONUNION WITH AUTOGRAFT, TIBIOTALAR ARTHRODESIS;  Surgeon: Terance Hart, MD;  Location: [facility] OR;  Service: Orthopedics;  Laterality: Left;   APPLICATION OF WOUND VAC Right 01/04/2021   Procedure: APPLICATION OF WOUND VAC;  Surgeon: Myrene Galas, MD;  Location: [facility];  Service: Orthopedics;  Laterality: Right;   BREAST CYST ASPIRATION Bilateral    neg   BREAST EXCISIONAL BIOPSY Right    1970's   CATARACT EXTRACTION W/ INTRAOCULAR LENS  IMPLANT, BILATERAL Bilateral    COLONOSCOPY     DILATION AND CURETTAGE OF UTERUS     HARDWARE REMOVAL Left 12/05/2021   Procedure: LEFT ANKLE DEEP HARDWARE REMOVAL, MEDIAL AND LATERAL;  Surgeon: Terance Hart, MD;  Location: [facility];  Service: Orthopedics;  Laterality: Left;  LENGTH OF SURGERY: 150  MINUTES   I & D EXTREMITY Left 01/04/2021   Procedure: IRRIGATION AND DEBRIDEMENT LEFT ANKLE;  Surgeon: Myrene Galas, MD;  Location: [facility];  Service: Orthopedics;  Laterality: Left;   I & D EXTREMITY Left 01/07/2021   Procedure: IRRIGATION AND DEBRIDEMENT EXTREMITY, Left ankle;  Surgeon: Myrene Galas, MD;  Location: [facility] OR;  Service: Orthopedics;  Laterality: Left;   KNEE ARTHROPLASTY Left 11/24/2016   Procedure: COMPUTER ASSISTED TOTAL KNEE ARTHROPLASTY;  Surgeon: Donato Heinz, MD;  Location: [facility];  Service: Orthopedics;  Laterality: Left;   KNEE ARTHROPLASTY Right 12/31/2020   Procedure: COMPUTER ASSISTED TOTAL KNEE ARTHROPLASTY;  Surgeon: Donato Heinz, MD;  Location: [facility];  Service: Orthopedics;  Laterality: Right;   KNEE ARTHROSCOPY Left    KNEE ARTHROSCOPY Left 07/18/2015   Procedure: ARTHROSCOPY KNEE WITH MEDIAL COMPARTMENTAL MENICUS CHONDROPLASTY;  Surgeon: Donato Heinz, MD;  Location: [facility];  Service: Orthopedics;  Laterality: Left;   KNEE ARTHROSCOPY WITH LATERAL MENISECTOMY Left 07/18/2015   Procedure: LEFT KNEE ARTHROSCOPY WITH PARTIAL LATERAL MENISECTOMY GRADE 4 CHONDROMYLASIA LATERAL TIBIAL PLATEAU;  Surgeon: Donato Heinz, MD;  Location: [facility];  Service: Orthopedics;  Laterality: Left;   ORIF ANKLE FRACTURE Left 01/04/2021   Procedure: OPEN REDUCTION INTERNAL FIXATION (ORIF)  ANKLE FRACTURE;  Surgeon: Altamese Robinson, MD;  Location: [facility];  Service: Orthopedics;  Laterality: Left;   ORIF ANKLE FRACTURE Left 01/07/2021   Procedure: SCREW REMOVAL ANKLE REMOVAL OF WOUND VAC LEFT LEG;  Surgeon: Altamese Honcut, MD;  Location: [facility];  Service: Orthopedics;  Laterality: Left;   Patient Active Problem List   Diagnosis Date Noted   Arthritis of left ankle 12/05/2021   Sleep disturbance    Acute blood loss anemia 01/18/2021   Hypokalemia 01/18/2021   Constipation 01/18/2021   Trauma 01/08/2021   Open left ankle fracture, sequela 01/03/2021   Essential hypertension  01/03/2021   Hypothyroidism 01/03/2021   Total knee replacement status 12/31/2020   Family history of heart disease 04/12/2018   History of total knee arthroplasty, left 11/24/2016   Adult idiopathic generalized osteoporosis 03/01/2015   OSA on CPAP 01/30/2014    PCP: Emily Filbert, MD  REFERRING PROVIDER: Erle Crocker, MD  REFERRING DIAG: Left tibiotalar arthrodesis with repair of tibial nonunion and hardware removal, 12/06/21  THERAPY DIAG:  Pain in left ankle and joints of left foot  Abnormality of gait and mobility  Stiffness of left ankle, not elsewhere classified  Muscle weakness (generalized)  Difficulty in walking, not elsewhere classified  Pain  Rationale for Evaluation and Treatment: Rehabilitation  ONSET DATE: 12/06/21  SUBJECTIVE:   SUBJECTIVE STATEMENT:  Pt reports she is doing well and responded well to the stairs last visit.  Pt notes that she hasn't had any sharp pain.  Pt was able to ambulate around the great room and in the kitchen at home yesterday.    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 PMH includes HTN, valvular insufficiency, SUI.    PAIN:  Are you having pain? Yes: NPRS scale: 2/10 Pain location: 2/10 lower leg superior to ankle Pain description: soreness Aggravating factors: Being on the knee scooter is more problematic on the surgically fixed knee.  PRECAUTIONS: Fall  WEIGHT BEARING RESTRICTIONS: Other: Now Weight bearing as tolerated - current using a walking boot  FALLS:  Has patient fallen in last 6 months? No  LIVING ENVIRONMENT: Lives with: lives with their spouse Lives in: House/apartment Stairs: Yes: Internal: 15 steps; on left going up and External: 4 steps; bilateral but cannot reach both Has following equipment at home: Single point  cane, Walker - 2 wheeled, Environmental consultant - 4 wheeled, Crutches, Electronics engineer, bed side commode, and Knee Scooter  OCCUPATION: Retired  PLOF: Independent  PATIENT GOALS: Pt wants to be able to go up/down the flight of steps.  NEXT MD VISIT:   OBJECTIVE:   DIAGNOSTIC FINDINGS:   CLINICAL DATA:  Fluoroscopic assistance for revision of internal fixation in left ankle   EXAM: LEFT ANKLE COMPLETE - 3+ VIEW   COMPARISON:  CT done on 09/24/2021   FINDINGS: There is interval placement of a new longer screw transfixing the medial malleolus. There is a new metallic plate along the anterior margin of distal tibia anchored to tibia and talus. Fluoroscopic time 1 minute and 11 seconds. Radiation dose 1.46 mGy.   IMPRESSION: Fluoroscopic assistance was provided for revision of internal fixation in left ankle.  PATIENT SURVEYS:  FOTO 40/50  COGNITION: Overall cognitive status: Within functional limits for tasks assessed     SENSATION: WFL  PALPATION: No tenderness surrounding the ankle  LOWER EXTREMITY ROM:  Active ROM Right eval Left eval  Hip flexion    Hip extension    Hip abduction    Hip adduction    Hip internal rotation    Hip external rotation    Knee flexion    Knee extension    Ankle dorsiflexion  2 deg  Ankle plantarflexion  13 deg  Ankle inversion  7 deg  Ankle eversion  3 deg   (Blank rows = not tested)   LOWER EXTREMITY SPECIAL TESTS:   Ankle special tests: Not performed at this time   FUNCTIONAL TESTS:   5 times sit to stand: 19.41 sec Timed up and go (TUG): 18.25 sec 10 meter walk test: 18.54 sec Dynamic Gait Index: Not performed due to weightbearing restrictions.   GAIT: Distance walked: Not performed due to weightbearing restrictions.    TODAY'S TREATMENT:   DATE: 03/18/22   Manual:  Manual:   STM applied to LE with focus placed on anterior tibialis, gastroc/soleus complex, and plantar fascia.  Pt with noticeable TP's noted in all  musculature and pain in the plantar fascia and anterior tib Long sitting distal tibifibular AP mobilizations, Grades II-III, for pain modulation and improved ankle ROM Long sitting metatarsal fan mobilization, Grades III, to improve ROM of the metatarsals Long sitting subtalar medial/lateral glides, Grades II-III, for pain modulation and improved ankle ROM   ThereEx:   Gentle DF/PF and IV/EV in long sitting position IV/EV while on towel for improved functional ROM, 2x10 each direction Arch lift, 2x15 reps with (VC and visual demo)  Great toe extension, 2x15 reps (continued difficulty with this task) Long sitting alphabet with ankle ROM, x1 bout Ambulation around the gym without the use of the ankle brace     PATIENT EDUCATION:  Education details: Pt educated on role of PT and services provided during current POC, along with prognosis and information about the clinic.  Person educated: Patient and Spouse Education method: Explanation Education comprehension: verbalized understanding  HOME EXERCISE PROGRAM:  Access Code: [email or web address] URL: [email or web address] Date: 01/27/2022 Prepared by: [place]  - 1 x daily - 7 x weekly - 3 sets - 10 reps - Seated Heel Raise  - 1 x daily - 7 x weekly - 3 sets - 10 reps - Seated Long Arc Quad  - 1 x daily - 7 x weekly - 3 sets - 10 reps - Ankle Inversion Eversion Towel Slide  - 1 x daily - 7 x weekly - 3 sets - 10 reps - Arch Lifting  - 1 x daily - 7 x weekly - 3 sets - 10 reps   ASSESSMENT:  CLINICAL IMPRESSION:  Pt noted slight increase in pain with mobility without the brace, but overall tolerable and continued with ambulation.  Pt is making significant progress with pain tolerance and reduction in overall pain.  Pt also demonstrating decreased tissue restriction in the plantar fascia region as well.  Pt does continue to have some TP's noted in the anterior tibialis, but was easily resolved  with manual therapy and TP release technique applied.   Pt will continue to benefit from skilled therapy to address remaining deficits in order to improve overall QoL and return to PLOF.           OBJECTIVE IMPAIRMENTS: Abnormal gait, decreased balance, decreased mobility, difficulty walking, decreased ROM, decreased strength, hypomobility, impaired flexibility, and pain.   ACTIVITY LIMITATIONS: standing, squatting, stairs, transfers, bed mobility, bathing, dressing,  and locomotion level  PARTICIPATION LIMITATIONS: meal prep, cleaning, laundry, driving, shopping, community activity, yard work, and church  PERSONAL FACTORS: Age, Past/current experiences, Time since onset of injury/illness/exacerbation, and 3+ comorbidities: arthritis, HTN, generalized osteoporosis  are also affecting patient's functional outcome.   REHAB POTENTIAL: Good  CLINICAL DECISION MAKING: Stable/uncomplicated  EVALUATION COMPLEXITY: Moderate   GOALS: Goals reviewed with patient? Yes  SHORT TERM GOALS: Target date: 02/24/2022    Pt will be independent with HEP in order to demonstrate increased ability to perform tasks related to occupation/hobbies. Baseline: Pt given HEP at initial evaluation; 03/05/2022- Patient verbalized compliance with HEP and able to return demonstration of ankle ROM and LE strenghthening. Review general LE strengthening with patient and husband. Goal status: Progressing   LONG TERM GOALS: Target date: 04/21/2022  1.  Patient (> 59 years old) will complete five times sit to stand test in < 15 seconds indicating an increased LE strength and improved balance. Baseline: Not performed due to weightbearing restrictions. 03/05/2022- Deferred - waiting to discuss any restrictions with MD Goal status: INITIAL  2.  Patient will increase FOTO score to equal to or greater than  50   to demonstrate statistically significant improvement in mobility and quality of life.  Baseline: FOTO: 40 Goal  status: INITIAL   3.  Patient will increase Berg Balance score by > 6 points to demonstrate decreased fall risk during functional activities. Baseline: Not performed due to time constraints. Goal status: INITIAL   4.  Patient will reduce timed up and go to <11 seconds to reduce fall risk and demonstrate improved transfer/gait ability. Baseline: 11.39 sec Goal status: INITIAL  5.  Patient will increase 10 meter walk test to >1.12m/s as to improve gait speed for better community ambulation and to reduce fall risk. Baseline: 18.59 sec - 0.54 m/s Goal status: INITIAL  6.  Patient will increase six minute walk test distance to >1000 for progression to community ambulator and improve gait ability Baseline: Not performed at this time and will perform at later date. Goal status: INITIAL     PLAN:  PT FREQUENCY: 2x/week  PT DURATION: 12 weeks  PLANNED INTERVENTIONS: Therapeutic exercises, Therapeutic activity, Neuromuscular re-education, Balance training, Gait training, Patient/Family education, Self Care, Joint mobilization, Stair training, DME instructions, Aquatic Therapy, Dry Needling, Electrical stimulation, Cryotherapy, Moist heat, scar mobilization, Ultrasound, Parrafin, Fluidotherapy, and Manual therapy  PLAN FOR NEXT SESSION:   Continue with weightbearing if tolerated. Continue with manual therapy for ROM and pain relief. Add progressive LE strengthening as appropriate.    Nolon Bussing, PT, DPT Physical Therapist- [facility]  03/18/22, 11:55 AM

## 2022-03-21 ENCOUNTER — Ambulatory Visit: Payer: Medicare Other

## 2022-03-21 DIAGNOSIS — R269 Unspecified abnormalities of gait and mobility: Secondary | ICD-10-CM

## 2022-03-21 DIAGNOSIS — M6281 Muscle weakness (generalized): Secondary | ICD-10-CM

## 2022-03-21 DIAGNOSIS — R52 Pain, unspecified: Secondary | ICD-10-CM

## 2022-03-21 DIAGNOSIS — M25672 Stiffness of left ankle, not elsewhere classified: Secondary | ICD-10-CM

## 2022-03-21 DIAGNOSIS — M25572 Pain in left ankle and joints of left foot: Secondary | ICD-10-CM | POA: Diagnosis not present

## 2022-03-21 DIAGNOSIS — R262 Difficulty in walking, not elsewhere classified: Secondary | ICD-10-CM

## 2022-03-21 NOTE — Therapy (Signed)
OUTPATIENT PHYSICAL THERAPY LOWER EXTREMITY TREATMENT   Patient Name: Hannah Tucker MRN: [id number] DOB:08-Feb-1941, 82 y.o., female Today's Date: 03/21/2022  END OF SESSION:  PT End of Session - 03/21/22 1020     Visit Number 14    Number of Visits 24    Date for PT Re-Evaluation 04/21/22    Authorization Type Medicare A&B Primary    Progress Note Due on Visit 20    PT Start Time 1020    PT Stop Time 1119    PT Time Calculation (min) 59 min    Equipment Utilized During Treatment Gait belt    Activity Tolerance Patient tolerated treatment well;No increased pain    Behavior During Therapy WFL for tasks assessed/performed              Past Medical History:  Diagnosis Date   Arthritis    Complication of anesthesia    nausea   Dyspnea    with exertion   Family history of adverse reaction to anesthesia    PONV- MOM   Hypertension    Hypothyroidism    PONV (postoperative nausea and vomiting)    Sleep apnea    uses CPAP nightly   Stress incontinence    Valvular insufficiency, mitral    Past Surgical History:  Procedure Laterality Date   ANKLE ARTHROSCOPY WITH ARTHRODESIS Left 12/05/2021   Procedure: REPAIR OF TIBIAL NONUNION WITH AUTOGRAFT, TIBIOTALAR ARTHRODESIS;  Surgeon: Erle Crocker, MD;  Location: [place];  Service: Orthopedics;  Laterality: Left;   APPLICATION OF WOUND VAC Right 01/04/2021   Procedure: APPLICATION OF WOUND VAC;  Surgeon: Altamese [place], MD;  Location: [place];  Service: Orthopedics;  Laterality: Right;   BREAST CYST ASPIRATION Bilateral    neg   BREAST EXCISIONAL BIOPSY Right    1970's   CATARACT EXTRACTION W/ INTRAOCULAR LENS  IMPLANT, BILATERAL Bilateral    COLONOSCOPY     DILATION AND CURETTAGE OF UTERUS     HARDWARE REMOVAL Left 12/05/2021   Procedure: LEFT ANKLE DEEP HARDWARE REMOVAL, MEDIAL AND LATERAL;  Surgeon: Erle Crocker, MD;  Location: [place];  Service: Orthopedics;  Laterality: Left;  LENGTH OF SURGERY: 150 MINUTES   I  & D EXTREMITY Left 01/04/2021   Procedure: IRRIGATION AND DEBRIDEMENT LEFT ANKLE;  Surgeon: Altamese Sherwood, MD;  Location: [place];  Service: Orthopedics;  Laterality: Left;   I & D EXTREMITY Left 01/07/2021   Procedure: IRRIGATION AND DEBRIDEMENT EXTREMITY, Left ankle;  Surgeon: Altamese [place], MD;  Location: [place];  Service: Orthopedics;  Laterality: Left;   KNEE ARTHROPLASTY Left 11/24/2016   Procedure: COMPUTER ASSISTED TOTAL KNEE ARTHROPLASTY;  Surgeon: Dereck Leep, MD;  Location: [facility];  Service: Orthopedics;  Laterality: Left;   KNEE ARTHROPLASTY Right 12/31/2020   Procedure: COMPUTER ASSISTED TOTAL KNEE ARTHROPLASTY;  Surgeon: Dereck Leep, MD;  Location: [facility];  Service: Orthopedics;  Laterality: Right;   KNEE ARTHROSCOPY Left    KNEE ARTHROSCOPY Left 07/18/2015   Procedure: ARTHROSCOPY KNEE WITH MEDIAL COMPARTMENTAL MENICUS CHONDROPLASTY;  Surgeon: Dereck Leep, MD;  Location: [facility];  Service: Orthopedics;  Laterality: Left;   KNEE ARTHROSCOPY WITH LATERAL MENISECTOMY Left 07/18/2015   Procedure: LEFT KNEE ARTHROSCOPY WITH PARTIAL LATERAL MENISECTOMY GRADE 4 CHONDROMYLASIA LATERAL TIBIAL PLATEAU;  Surgeon: Dereck Leep, MD;  Location: [facility];  Service: Orthopedics;  Laterality: Left;   ORIF ANKLE FRACTURE Left 01/04/2021   Procedure: OPEN REDUCTION INTERNAL FIXATION (ORIF) ANKLE FRACTURE;  Surgeon: Altamese [place],  MD;  Location: [facility];  Service: Orthopedics;  Laterality: Left;   ORIF ANKLE FRACTURE Left 01/07/2021   Procedure: SCREW REMOVAL ANKLE REMOVAL OF WOUND VAC LEFT LEG;  Surgeon: Myrene Galas, MD;  Location: [facility];  Service: Orthopedics;  Laterality: Left;   Patient Active Problem List   Diagnosis Date Noted   Arthritis of left ankle 12/05/2021   Sleep disturbance    Acute blood loss anemia 01/18/2021   Hypokalemia 01/18/2021   Constipation 01/18/2021   Trauma 01/08/2021   Open left ankle fracture, sequela 01/03/2021   Essential hypertension 01/03/2021    Hypothyroidism 01/03/2021   Total knee replacement status 12/31/2020   Family history of heart disease 04/12/2018   History of total knee arthroplasty, left 11/24/2016   Adult idiopathic generalized osteoporosis 03/01/2015   OSA on CPAP 01/30/2014    PCP: Bethann Punches, MD  REFERRING PROVIDER: Terance Hart, MD  REFERRING DIAG: Left tibiotalar arthrodesis with repair of tibial nonunion and hardware removal, 12/06/21  THERAPY DIAG:  Pain in left ankle and joints of left foot  Abnormality of gait and mobility  Stiffness of left ankle, not elsewhere classified  Muscle weakness (generalized)  Difficulty in walking, not elsewhere classified  Pain  Rationale for Evaluation and Treatment: Rehabilitation  ONSET DATE: 12/06/21  SUBJECTIVE:   SUBJECTIVE STATEMENT:  Pt reports she took a rest break yesterday because she was in some discomfort from Wednesday.  Pt states she was moving furniture on Wednesday due to not having enough space to maneuver all of the equipment that she to use.  Pt states that her ankle was a little angry with her yesterday, so she took it easy.  Pt supports having 3/10 pain upon arrival today.    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 PMH includes HTN, valvular insufficiency, SUI.    PAIN:  Are you having pain? Yes: NPRS scale: 3/10 Pain location: anterior tibialis region Pain description: soreness Aggravating factors: Being on the knee scooter is more problematic on the surgically fixed knee.  PRECAUTIONS: Fall  WEIGHT BEARING RESTRICTIONS: Other: Now Weight bearing as tolerated - current using a walking boot  FALLS:  Has patient fallen in last 6 months? No  LIVING ENVIRONMENT: Lives with: lives with their spouse Lives in: House/apartment Stairs: Yes:  Internal: 15 steps; on left going up and External: 4 steps; bilateral but cannot reach both Has following equipment at home: Single point cane, Walker - 2 wheeled, Environmental consultant - 4 wheeled, Crutches, Tour manager, bed side commode, and Knee Scooter  OCCUPATION: Retired  PLOF: Independent  PATIENT GOALS: Pt wants to be able to go up/down the flight of steps.  NEXT MD VISIT:   OBJECTIVE:   DIAGNOSTIC FINDINGS:   CLINICAL DATA:  Fluoroscopic assistance for revision of internal fixation in left ankle   EXAM: LEFT ANKLE COMPLETE - 3+ VIEW   COMPARISON:  CT done on 09/24/2021   FINDINGS: There is interval placement of a new longer screw transfixing the medial malleolus. There is a new metallic plate along the anterior margin of distal tibia anchored to tibia and talus. Fluoroscopic time 1 minute and 11 seconds. Radiation dose 1.46 mGy.   IMPRESSION: Fluoroscopic assistance was provided for revision of internal fixation in left ankle.  PATIENT SURVEYS:  FOTO 40/50  COGNITION: Overall cognitive status: Within  functional limits for tasks assessed     SENSATION: WFL  PALPATION: No tenderness surrounding the ankle  LOWER EXTREMITY ROM:  Active ROM Right eval Left eval  Hip flexion    Hip extension    Hip abduction    Hip adduction    Hip internal rotation    Hip external rotation    Knee flexion    Knee extension    Ankle dorsiflexion  2 deg  Ankle plantarflexion  13 deg  Ankle inversion  7 deg  Ankle eversion  3 deg   (Blank rows = not tested)   LOWER EXTREMITY SPECIAL TESTS:   Ankle special tests: Not performed at this time   FUNCTIONAL TESTS:   5 times sit to stand: 19.41 sec Timed up and go (TUG): 18.25 sec 10 meter walk test: 18.54 sec Dynamic Gait Index: Not performed due to weightbearing restrictions.   GAIT: Distance walked: Not performed due to weightbearing restrictions.    TODAY'S TREATMENT:   DATE: 03/21/22   There.Ex:  Seated gentle  DF/PF with use of the singular pro-stretch, 2x20 reps Seated gentle IV/EV while on towel for improved functional ROM, 2x20 each direction Seated arch lift, 2x20 reps Seated great toe extension, 2x20 reps (continued difficulty with this task) Seated toe yoga (extending great toe and flexing other 4 toes), 2x20 reps Seated toe scrunches on towel in sitting 2x20 reps  Ambulation around the gym with use of the walker and no ankle brace, x2 laps with no rest break.  Ambulation to the elevator with use of the walker and increased cues for more normalized gait patter and increasing IR to have toes facing forward rather than abducted/everted.     PATIENT EDUCATION:  Education details: Pt educated on role of PT and services provided during current POC, along with prognosis and information about the clinic.  Person educated: Patient and Spouse Education method: Explanation Education comprehension: verbalized understanding  HOME EXERCISE PROGRAM:  Access Code: [email or web address] URL: [email or web address] Date: 01/27/2022 Prepared by: Tomasa Hose  Exercises - Towel Scrunches  - 1 x daily - 7 x weekly - 3 sets - 10 reps - Seated Heel Raise  - 1 x daily - 7 x weekly - 3 sets - 10 reps - Seated Long Arc Quad  - 1 x daily - 7 x weekly - 3 sets - 10 reps - Ankle Inversion Eversion Towel Slide  - 1 x daily - 7 x weekly - 3 sets - 10 reps - Arch Lifting  - 1 x daily - 7 x weekly - 3 sets - 10 reps   ASSESSMENT:  CLINICAL IMPRESSION:  Pt continues to perform well with tasks given and is making some improvements with the contraction of her intrinsic foot musculature.  Pt continues to perform HEP at home and is doing well with it and is consistent.  Pt did question ambulating without the use of the walker, however due to the nature of the surgery, pt advised to contact MD before attempting to performing that as they may not want her to do so.  Pt otherwise will continue with current POC going  forward.   Pt will continue to benefit from skilled therapy to address remaining deficits in order to improve overall QoL and return to PLOF.          OBJECTIVE IMPAIRMENTS: Abnormal gait, decreased balance, decreased mobility, difficulty walking, decreased ROM, decreased strength, hypomobility, impaired flexibility, and pain.   ACTIVITY LIMITATIONS: standing, squatting, stairs, transfers, bed  mobility, bathing, dressing, and locomotion level  PARTICIPATION LIMITATIONS: meal prep, cleaning, laundry, driving, shopping, community activity, yard work, and church  PERSONAL FACTORS: Age, Past/current experiences, Time since onset of injury/illness/exacerbation, and 3+ comorbidities: arthritis, HTN, generalized osteoporosis  are also affecting patient's functional outcome.   REHAB POTENTIAL: Good  CLINICAL DECISION MAKING: Stable/uncomplicated  EVALUATION COMPLEXITY: Moderate   GOALS: Goals reviewed with patient? Yes  SHORT TERM GOALS: Target date: 02/24/2022    Pt will be independent with HEP in order to demonstrate increased ability to perform tasks related to occupation/hobbies. Baseline: Pt given HEP at initial evaluation; 03/05/2022- Patient verbalized compliance with HEP and able to return demonstration of ankle ROM and LE strenghthening. Review general LE strengthening with patient and husband. Goal status: Progressing   LONG TERM GOALS: Target date: 04/21/2022  1.  Patient (> 35 years old) will complete five times sit to stand test in < 15 seconds indicating an increased LE strength and improved balance. Baseline: Not performed due to weightbearing restrictions. 03/05/2022- Deferred - waiting to discuss any restrictions with MD Goal status: INITIAL  2.  Patient will increase FOTO score to equal to or greater than  50   to demonstrate statistically significant improvement in mobility and quality of life.  Baseline: FOTO: 40 Goal status: INITIAL   3.  Patient will increase Berg  Balance score by > 6 points to demonstrate decreased fall risk during functional activities. Baseline: Not performed due to time constraints. Goal status: INITIAL   4.  Patient will reduce timed up and go to <11 seconds to reduce fall risk and demonstrate improved transfer/gait ability. Baseline: 11.39 sec Goal status: INITIAL  5.  Patient will increase 10 meter walk test to >1.3m/s as to improve gait speed for better community ambulation and to reduce fall risk. Baseline: 18.59 sec - 0.54 m/s Goal status: INITIAL  6.  Patient will increase six minute walk test distance to >1000 for progression to community ambulator and improve gait ability Baseline: Not performed at this time and will perform at later date. Goal status: INITIAL     PLAN:  PT FREQUENCY: 2x/week  PT DURATION: 12 weeks  PLANNED INTERVENTIONS: Therapeutic exercises, Therapeutic activity, Neuromuscular re-education, Balance training, Gait training, Patient/Family education, Self Care, Joint mobilization, Stair training, DME instructions, Aquatic Therapy, Dry Needling, Electrical stimulation, Cryotherapy, Moist heat, scar mobilization, Ultrasound, Parrafin, Fluidotherapy, and Manual therapy  PLAN FOR NEXT SESSION:   Continue with weightbearing if tolerated. Continue with manual therapy for ROM and pain relief. Add progressive LE strengthening as appropriate.    Gwenlyn Saran, PT, DPT Physical Therapist- [facility]  03/21/22, 11:36 AM

## 2022-03-24 ENCOUNTER — Ambulatory Visit: Payer: Medicare Other

## 2022-03-24 DIAGNOSIS — R52 Pain, unspecified: Secondary | ICD-10-CM

## 2022-03-24 DIAGNOSIS — R262 Difficulty in walking, not elsewhere classified: Secondary | ICD-10-CM

## 2022-03-24 DIAGNOSIS — M25672 Stiffness of left ankle, not elsewhere classified: Secondary | ICD-10-CM

## 2022-03-24 DIAGNOSIS — M25572 Pain in left ankle and joints of left foot: Secondary | ICD-10-CM

## 2022-03-24 DIAGNOSIS — M6281 Muscle weakness (generalized): Secondary | ICD-10-CM

## 2022-03-24 DIAGNOSIS — R269 Unspecified abnormalities of gait and mobility: Secondary | ICD-10-CM

## 2022-03-24 NOTE — Therapy (Signed)
OUTPATIENT PHYSICAL THERAPY LOWER EXTREMITY TREATMENT   Patient Name: Hannah Tucker MRN: [id number] DOB:Oct 22, 1940, 82 y.o., female Today's Date: 03/24/2022  END OF SESSION:  PT End of Session - 03/24/22 1148     Visit Number 15    Number of Visits 24    Date for PT Re-Evaluation 04/21/22    Authorization Type Medicare A&B Primary    Progress Note Due on Visit 20    PT Start Time 1148    PT Stop Time 1230    PT Time Calculation (min) 42 min    Equipment Utilized During Treatment Gait belt    Activity Tolerance Patient tolerated treatment well;No increased pain    Behavior During Therapy WFL for tasks assessed/performed               Past Medical History:  Diagnosis Date   Arthritis    Complication of anesthesia    nausea   Dyspnea    with exertion   Family history of adverse reaction to anesthesia    PONV- MOM   Hypertension    Hypothyroidism    PONV (postoperative nausea and vomiting)    Sleep apnea    uses CPAP nightly   Stress incontinence    Valvular insufficiency, mitral    Past Surgical History:  Procedure Laterality Date   ANKLE ARTHROSCOPY WITH ARTHRODESIS Left 12/05/2021   Procedure: REPAIR OF TIBIAL NONUNION WITH AUTOGRAFT, TIBIOTALAR ARTHRODESIS;  Surgeon: Erle Crocker, MD;  Location: [place] [facility];  Service: Orthopedics;  Laterality: Left;   APPLICATION OF WOUND VAC Right 01/04/2021   Procedure: APPLICATION OF WOUND VAC;  Surgeon: Altamese Willernie, MD;  Location: [place];  Service: Orthopedics;  Laterality: Right;   BREAST CYST ASPIRATION Bilateral    neg   BREAST EXCISIONAL BIOPSY Right    1970's   CATARACT EXTRACTION W/ INTRAOCULAR LENS  IMPLANT, BILATERAL Bilateral    COLONOSCOPY     DILATION AND CURETTAGE OF UTERUS     HARDWARE REMOVAL Left 12/05/2021   Procedure: LEFT ANKLE DEEP HARDWARE REMOVAL, MEDIAL AND LATERAL;  Surgeon: Erle Crocker, MD;  Location: [place];  Service: Orthopedics;  Laterality: Left;  LENGTH OF SURGERY: 150 MINUTES    I & D EXTREMITY Left 01/04/2021   Procedure: IRRIGATION AND DEBRIDEMENT LEFT ANKLE;  Surgeon: Altamese Harrell, MD;  Location: [place];  Service: Orthopedics;  Laterality: Left;   I & D EXTREMITY Left 01/07/2021   Procedure: IRRIGATION AND DEBRIDEMENT EXTREMITY, Left ankle;  Surgeon: Altamese Ferguson, MD;  Location: [place];  Service: Orthopedics;  Laterality: Left;   KNEE ARTHROPLASTY Left 11/24/2016   Procedure: COMPUTER ASSISTED TOTAL KNEE ARTHROPLASTY;  Surgeon: Dereck Leep, MD;  Location: [facility];  Service: Orthopedics;  Laterality: Left;   KNEE ARTHROPLASTY Right 12/31/2020   Procedure: COMPUTER ASSISTED TOTAL KNEE ARTHROPLASTY;  Surgeon: Dereck Leep, MD;  Location: [facility];  Service: Orthopedics;  Laterality: Right;   KNEE ARTHROSCOPY Left    KNEE ARTHROSCOPY Left 07/18/2015   Procedure: ARTHROSCOPY KNEE WITH MEDIAL COMPARTMENTAL MENICUS CHONDROPLASTY;  Surgeon: Dereck Leep, MD;  Location: [facility];  Service: Orthopedics;  Laterality: Left;   KNEE ARTHROSCOPY WITH LATERAL MENISECTOMY Left 07/18/2015   Procedure: LEFT KNEE ARTHROSCOPY WITH PARTIAL LATERAL MENISECTOMY GRADE 4 CHONDROMYLASIA LATERAL TIBIAL PLATEAU;  Surgeon: Dereck Leep, MD;  Location: [facility];  Service: Orthopedics;  Laterality: Left;   ORIF ANKLE FRACTURE Left 01/04/2021   Procedure: OPEN REDUCTION INTERNAL FIXATION (ORIF) ANKLE FRACTURE;  Surgeon: Marcelino Scot,  Casimiro Needle, MD;  Location: [facility];  Service: Orthopedics;  Laterality: Left;   ORIF ANKLE FRACTURE Left 01/07/2021   Procedure: SCREW REMOVAL ANKLE REMOVAL OF WOUND VAC LEFT LEG;  Surgeon: Myrene Galas, MD;  Location: [facility];  Service: Orthopedics;  Laterality: Left;   Patient Active Problem List   Diagnosis Date Noted   Arthritis of left ankle 12/05/2021   Sleep disturbance    Acute blood loss anemia 01/18/2021   Hypokalemia 01/18/2021   Constipation 01/18/2021   Trauma 01/08/2021   Open left ankle fracture, sequela 01/03/2021   Essential hypertension 01/03/2021    Hypothyroidism 01/03/2021   Total knee replacement status 12/31/2020   Family history of heart disease 04/12/2018   History of total knee arthroplasty, left 11/24/2016   Adult idiopathic generalized osteoporosis 03/01/2015   OSA on CPAP 01/30/2014    PCP: Bethann Punches, MD  REFERRING PROVIDER: Terance Hart, MD  REFERRING DIAG: Left tibiotalar arthrodesis with repair of tibial nonunion and hardware removal, 12/06/21  THERAPY DIAG:  Pain in left ankle and joints of left foot  Abnormality of gait and mobility  Stiffness of left ankle, not elsewhere classified  Muscle weakness (generalized)  Difficulty in walking, not elsewhere classified  Pain  Rationale for Evaluation and Treatment: Rehabilitation  ONSET DATE: 12/06/21   SUBJECTIVE:   SUBJECTIVE STATEMENT:  Pt reports that they are still having complications with their heating unit and the house is 55 deg currently.  Pt is having a hard time finding someone that will work on a hot water baseboard heating unit.     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 PMH includes HTN, valvular insufficiency, SUI.    PAIN:  Are you having pain? Yes: NPRS scale: 2/10 Pain location: more in the ankle Pain description: soreness Aggravating factors: Being on the knee scooter is more problematic on the surgically fixed knee.  PRECAUTIONS: Fall  WEIGHT BEARING RESTRICTIONS: Other: Now Weight bearing as tolerated - current using a walking boot  FALLS:  Has patient fallen in last 6 months? No  LIVING ENVIRONMENT: Lives with: lives with their spouse Lives in: House/apartment Stairs: Yes: Internal: 15 steps; on left going up and External: 4 steps; bilateral but cannot reach both Has following equipment at home: Single point cane, Walker - 2  wheeled, Environmental consultant - 4 wheeled, Crutches, Tour manager, bed side commode, and Knee Scooter  OCCUPATION: Retired  PLOF: Independent  PATIENT GOALS: Pt wants to be able to go up/down the flight of steps.  NEXT MD VISIT:   OBJECTIVE:   DIAGNOSTIC FINDINGS:   CLINICAL DATA:  Fluoroscopic assistance for revision of internal fixation in left ankle   EXAM: LEFT ANKLE COMPLETE - 3+ VIEW   COMPARISON:  CT done on 09/24/2021   FINDINGS: There is interval placement of a new longer screw transfixing the medial malleolus. There is a new metallic plate along the anterior margin of distal tibia anchored to tibia and talus. Fluoroscopic time 1 minute and 11 seconds. Radiation dose 1.46 mGy.   IMPRESSION: Fluoroscopic assistance was provided for revision of internal fixation in left ankle.  PATIENT SURVEYS:  FOTO 40/50  COGNITION: Overall cognitive status: Within functional limits for tasks assessed     SENSATION: WFL  PALPATION: No tenderness surrounding the ankle  LOWER EXTREMITY ROM:  Active ROM Right  eval Left eval  Ankle dorsiflexion  2 deg  Ankle plantarflexion  13 deg  Ankle inversion  7 deg  Ankle eversion  3 deg   (Blank rows = not tested)   LOWER EXTREMITY SPECIAL TESTS:   Ankle special tests: Not performed at this time   FUNCTIONAL TESTS:   5 times sit to stand: 19.41 sec Timed up and go (TUG): 18.25 sec 10 meter walk test: 18.54 sec Dynamic Gait Index: Not performed due to weightbearing restrictions.   GAIT: Distance walked: Not performed due to weightbearing restrictions.    TODAY'S TREATMENT:   DATE: 03/24/22   There.Ex:  Seated gentle DF/PF with use of the singular pro-stretch, 2x20 reps Seated gentle IV/EV while on towel for improved functional ROM, 2x20 each direction Seated arch lift, 2x20 reps Standing lunges at stairs with UE support, 3 sec holds, 2x15 each LE Lateral weight shifts on tilt board, 2x15 Forward weight shifts on tilt  board, 2x15 Static stance on airex pad, 30 sec bouts x3 Weight shifts on airex pad, 2x15 each side Marches on airex pad, 2x15 each LE   Ambulation around the gym with use of the walker and no ankle brace, x2 laps with no rest break Ambulation around the gym with use of SPC and no ankle brace, x1 lap with no rest break    PATIENT EDUCATION:  Education details: Pt educated on role of PT and services provided during current POC, along with prognosis and information about the clinic.  Person educated: Patient and Spouse Education method: Explanation Education comprehension: verbalized understanding  HOME EXERCISE PROGRAM:  Access Code: [email or web address] URL: [email or web address] Date: 01/27/2022 Prepared by: Tomasa Hose  Exercises - Towel Scrunches  - 1 x daily - 7 x weekly - 3 sets - 10 reps - Seated Heel Raise  - 1 x daily - 7 x weekly - 3 sets - 10 reps - Seated Long Arc Quad  - 1 x daily - 7 x weekly - 3 sets - 10 reps - Ankle Inversion Eversion Towel Slide  - 1 x daily - 7 x weekly - 3 sets - 10 reps - Arch Lifting  - 1 x daily - 7 x weekly - 3 sets - 10 reps   ASSESSMENT:  CLINICAL IMPRESSION:  Pt able to tolerate ambulation with use of the [facility] and noted minimal increase in pain.  Pt also able to tolerate the exercises with a reduction in her pain of the anterior tibialis.  Pt did note some increased stretch of the anterior tibialis with plantarflexion of the L LE while on the half foam roller.  Pt advised to continue performing her HEP at home and improve her tolerance to ambulation for improved results.   Pt will continue to benefit from skilled therapy to address remaining deficits in order to improve overall QoL and return to PLOF.     Pt continues to perform well with tasks given and is making some improvements with the contraction of her intrinsic foot musculature.  Pt continues to perform HEP at home and is doing well with it and is consistent.  Pt did question  ambulating without the use of the walker, however due to the nature of the surgery, pt advised to contact MD before attempting to performing that as they may not want her to do so.  Pt otherwise will continue with current POC going forward.   Pt will continue to benefit from skilled therapy to address remaining deficits in order to improve  overall QoL and return to PLOF.          OBJECTIVE IMPAIRMENTS: Abnormal gait, decreased balance, decreased mobility, difficulty walking, decreased ROM, decreased strength, hypomobility, impaired flexibility, and pain.   ACTIVITY LIMITATIONS: standing, squatting, stairs, transfers, bed mobility, bathing, dressing, and locomotion level  PARTICIPATION LIMITATIONS: meal prep, cleaning, laundry, driving, shopping, community activity, yard work, and church  PERSONAL FACTORS: Age, Past/current experiences, Time since onset of injury/illness/exacerbation, and 3+ comorbidities: arthritis, HTN, generalized osteoporosis  are also affecting patient's functional outcome.   REHAB POTENTIAL: Good  CLINICAL DECISION MAKING: Stable/uncomplicated  EVALUATION COMPLEXITY: Moderate   GOALS: Goals reviewed with patient? Yes  SHORT TERM GOALS: Target date: 02/24/2022    Pt will be independent with HEP in order to demonstrate increased ability to perform tasks related to occupation/hobbies. Baseline: Pt given HEP at initial evaluation; 03/05/2022- Patient verbalized compliance with HEP and able to return demonstration of ankle ROM and LE strenghthening. Review general LE strengthening with patient and husband. Goal status: Progressing   LONG TERM GOALS: Target date: 04/21/2022  1.  Patient (> 82 years old) will complete five times sit to stand test in < 15 seconds indicating an increased LE strength and improved balance. Baseline: Not performed due to weightbearing restrictions. 03/05/2022- Deferred - waiting to discuss any restrictions with MD Goal status: INITIAL  2.   Patient will increase FOTO score to equal to or greater than  50   to demonstrate statistically significant improvement in mobility and quality of life.  Baseline: FOTO: 40 Goal status: INITIAL   3.  Patient will increase Berg Balance score by > 6 points to demonstrate decreased fall risk during functional activities. Baseline: Not performed due to time constraints. Goal status: INITIAL   4.  Patient will reduce timed up and go to <11 seconds to reduce fall risk and demonstrate improved transfer/gait ability. Baseline: 11.39 sec Goal status: INITIAL  5.  Patient will increase 10 meter walk test to >1.50m/s as to improve gait speed for better community ambulation and to reduce fall risk. Baseline: 18.59 sec - 0.54 m/s Goal status: INITIAL  6.  Patient will increase six minute walk test distance to >1000 for progression to community ambulator and improve gait ability Baseline: Not performed at this time and will perform at later date. Goal status: INITIAL     PLAN:  PT FREQUENCY: 2x/week  PT DURATION: 12 weeks  PLANNED INTERVENTIONS: Therapeutic exercises, Therapeutic activity, Neuromuscular re-education, Balance training, Gait training, Patient/Family education, Self Care, Joint mobilization, Stair training, DME instructions, Aquatic Therapy, Dry Needling, Electrical stimulation, Cryotherapy, Moist heat, scar mobilization, Ultrasound, Parrafin, Fluidotherapy, and Manual therapy  PLAN FOR NEXT SESSION:   Continue with weightbearing if tolerated. Continue with manual therapy for ROM and pain relief. Add progressive LE strengthening as appropriate.    Gwenlyn Saran, PT, DPT Physical Therapist- [facility]  03/24/22, 2:19 PM

## 2022-03-26 ENCOUNTER — Ambulatory Visit: Payer: Medicare Other

## 2022-03-26 DIAGNOSIS — M25572 Pain in left ankle and joints of left foot: Secondary | ICD-10-CM

## 2022-03-26 DIAGNOSIS — M25672 Stiffness of left ankle, not elsewhere classified: Secondary | ICD-10-CM

## 2022-03-26 DIAGNOSIS — M6281 Muscle weakness (generalized): Secondary | ICD-10-CM

## 2022-03-26 DIAGNOSIS — R52 Pain, unspecified: Secondary | ICD-10-CM

## 2022-03-26 DIAGNOSIS — R262 Difficulty in walking, not elsewhere classified: Secondary | ICD-10-CM

## 2022-03-26 DIAGNOSIS — R269 Unspecified abnormalities of gait and mobility: Secondary | ICD-10-CM

## 2022-03-26 NOTE — Therapy (Signed)
OUTPATIENT PHYSICAL THERAPY LOWER EXTREMITY TREATMENT   Patient Name: Hannah Tucker MRN: [id number] DOB:[birth year], 82 y.o., female Today's Date: 03/26/2022  END OF SESSION:   PT End of Session - 03/26/22 1159     Visit Number 16    Number of Visits 24    Date for PT Re-Evaluation 04/21/22    Authorization Type Medicare A&B Primary    Progress Note Due on Visit 20    PT Start Time 1157    PT Stop Time 1230    PT Time Calculation (min) 33 min    Equipment Utilized During Treatment Gait belt    Activity Tolerance Patient tolerated treatment well;No increased pain    Behavior During Therapy WFL for tasks assessed/performed              Past Medical History:  Diagnosis Date   Arthritis    Complication of anesthesia    nausea   Dyspnea    with exertion   Family history of adverse reaction to anesthesia    PONV- MOM   Hypertension    Hypothyroidism    PONV (postoperative nausea and vomiting)    Sleep apnea    uses CPAP nightly   Stress incontinence    Valvular insufficiency, mitral    Past Surgical History:  Procedure Laterality Date   ANKLE ARTHROSCOPY WITH ARTHRODESIS Left 12/05/2021   Procedure: REPAIR OF TIBIAL NONUNION WITH AUTOGRAFT, TIBIOTALAR ARTHRODESIS;  Surgeon: Erle Crocker, MD;  Location: [place];  Service: Orthopedics;  Laterality: Left;   APPLICATION OF WOUND VAC Right 01/04/2021   Procedure: APPLICATION OF WOUND VAC;  Surgeon: Altamese Kwethluk, MD;  Location: [place];  Service: Orthopedics;  Laterality: Right;   BREAST CYST ASPIRATION Bilateral    neg   BREAST EXCISIONAL BIOPSY Right    1970's   CATARACT EXTRACTION W/ INTRAOCULAR LENS  IMPLANT, BILATERAL Bilateral    COLONOSCOPY     DILATION AND CURETTAGE OF UTERUS     HARDWARE REMOVAL Left 12/05/2021   Procedure: LEFT ANKLE DEEP HARDWARE REMOVAL, MEDIAL AND LATERAL;  Surgeon: Erle Crocker, MD;  Location: [place];  Service: Orthopedics;  Laterality: Left;  LENGTH OF SURGERY: 150 MINUTES    I & D EXTREMITY Left 01/04/2021   Procedure: IRRIGATION AND DEBRIDEMENT LEFT ANKLE;  Surgeon: Altamese Dundee, MD;  Location: [place];  Service: Orthopedics;  Laterality: Left;   I & D EXTREMITY Left 01/07/2021   Procedure: IRRIGATION AND DEBRIDEMENT EXTREMITY, Left ankle;  Surgeon: Altamese [place], MD;  Location: [place];  Service: Orthopedics;  Laterality: Left;   KNEE ARTHROPLASTY Left 11/24/2016   Procedure: COMPUTER ASSISTED TOTAL KNEE ARTHROPLASTY;  Surgeon: Dereck Leep, MD;  Location: [facility];  Service: Orthopedics;  Laterality: Left;   KNEE ARTHROPLASTY Right 12/31/2020   Procedure: COMPUTER ASSISTED TOTAL KNEE ARTHROPLASTY;  Surgeon: Dereck Leep, MD;  Location: [facility];  Service: Orthopedics;  Laterality: Right;   KNEE ARTHROSCOPY Left    KNEE ARTHROSCOPY Left 07/18/2015   Procedure: ARTHROSCOPY KNEE WITH MEDIAL COMPARTMENTAL MENICUS CHONDROPLASTY;  Surgeon: Dereck Leep, MD;  Location: [facility];  Service: Orthopedics;  Laterality: Left;   KNEE ARTHROSCOPY WITH LATERAL MENISECTOMY Left 07/18/2015   Procedure: LEFT KNEE ARTHROSCOPY WITH PARTIAL LATERAL MENISECTOMY GRADE 4 CHONDROMYLASIA LATERAL TIBIAL PLATEAU;  Surgeon: Dereck Leep, MD;  Location: [facility];  Service: Orthopedics;  Laterality: Left;   ORIF ANKLE FRACTURE Left 01/04/2021   Procedure: OPEN REDUCTION INTERNAL FIXATION (ORIF) ANKLE FRACTURE;  Surgeon: Marcelino Scot,  Casimiro Needle, MD;  Location: [facility];  Service: Orthopedics;  Laterality: Left;   ORIF ANKLE FRACTURE Left 01/07/2021   Procedure: SCREW REMOVAL ANKLE REMOVAL OF WOUND VAC LEFT LEG;  Surgeon: Myrene Galas, MD;  Location: [facility];  Service: Orthopedics;  Laterality: Left;   Patient Active Problem List   Diagnosis Date Noted   Arthritis of left ankle 12/05/2021   Sleep disturbance    Acute blood loss anemia 01/18/2021   Hypokalemia 01/18/2021   Constipation 01/18/2021   Trauma 01/08/2021   Open left ankle fracture, sequela 01/03/2021   Essential hypertension 01/03/2021    Hypothyroidism 01/03/2021   Total knee replacement status 12/31/2020   Family history of heart disease 04/12/2018   History of total knee arthroplasty, left 11/24/2016   Adult idiopathic generalized osteoporosis 03/01/2015   OSA on CPAP 01/30/2014    PCP: Bethann Punches, MD  REFERRING PROVIDER: Terance Hart, MD  REFERRING DIAG: Left tibiotalar arthrodesis with repair of tibial nonunion and hardware removal, 12/06/21  THERAPY DIAG:  Pain in left ankle and joints of left foot  Abnormality of gait and mobility  Stiffness of left ankle, not elsewhere classified  Muscle weakness (generalized)  Difficulty in walking, not elsewhere classified  Pain  Rationale for Evaluation and Treatment: Rehabilitation  ONSET DATE: 12/06/21   SUBJECTIVE:   SUBJECTIVE STATEMENT:  Pt reports she is not getting enough sleep due to the heat still being down in their house.   Pt states that the part for their heat is going to cost $700 for the part and then another $400 to install it and the earliest it would be, would be Friday.   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 PMH includes HTN, valvular insufficiency, SUI.    PAIN:  Are you having pain? Yes: NPRS scale: 2/10 Pain location: more in the ankle Pain description: soreness Aggravating factors: Being on the knee scooter is more problematic on the surgically fixed knee.  PRECAUTIONS: Fall  WEIGHT BEARING RESTRICTIONS: Other: Now Weight bearing as tolerated - current using a walking boot  FALLS:  Has patient fallen in last 6 months? No  LIVING ENVIRONMENT: Lives with: lives with their spouse Lives in: House/apartment Stairs: Yes: Internal: 15 steps; on left going up and External: 4 steps; bilateral but cannot reach both Has following equipment  at home: Single point cane, Walker - 2 wheeled, Environmental consultant - 4 wheeled, Crutches, Tour manager, bed side commode, and Knee Scooter  OCCUPATION: Retired  PLOF: Independent  PATIENT GOALS: Pt wants to be able to go up/down the flight of steps.  NEXT MD VISIT:   OBJECTIVE:   DIAGNOSTIC FINDINGS:   CLINICAL DATA:  Fluoroscopic assistance for revision of internal fixation in left ankle   EXAM: LEFT ANKLE COMPLETE - 3+ VIEW   COMPARISON:  CT done on 09/24/2021   FINDINGS: There is interval placement of a new longer screw transfixing the medial malleolus. There is a new metallic plate along the anterior margin of distal tibia anchored to tibia and talus. Fluoroscopic time 1 minute and 11 seconds. Radiation dose 1.46 mGy.   IMPRESSION: Fluoroscopic assistance was provided for revision of internal fixation in left ankle.  PATIENT SURVEYS:  FOTO 40/50  COGNITION: Overall cognitive status: Within functional limits for tasks assessed     SENSATION: WFL  PALPATION: No  tenderness surrounding the ankle  LOWER EXTREMITY ROM:  Active ROM Right eval Left eval  Ankle dorsiflexion  2 deg  Ankle plantarflexion  13 deg  Ankle inversion  7 deg  Ankle eversion  3 deg   (Blank rows = not tested)   LOWER EXTREMITY SPECIAL TESTS:   Ankle special tests: Not performed at this time   FUNCTIONAL TESTS:   5 times sit to stand: 19.41 sec Timed up and go (TUG): 18.25 sec 10 meter walk test: 18.54 sec Dynamic Gait Index: Not performed due to weightbearing restrictions.   GAIT: Distance walked: Not performed due to weightbearing restrictions.    TODAY'S TREATMENT:   DATE: 03/26/22   There.Ex:  Seated gentle DF/PF with use of the singular pro-stretch, 2x20 reps Seated lateral ankle weight shifts on dynadisc, 2x15 Seated forward weight shifts on dynadisc, 2x15 Seated cw/ccw ankle mobility on dynadisc, 2x15 Seated resisted   Ambulation around the gym with use of SPC and no  ankle brace, x3 laps with no rest break     PATIENT EDUCATION:  Education details: Pt educated on role of PT and services provided during current POC, along with prognosis and information about the clinic.  Person educated: Patient and Spouse Education method: Explanation Education comprehension: verbalized understanding  HOME EXERCISE PROGRAM:  Access Code: [email or web address] URL: [email or web address] Date: 01/27/2022 Prepared by: Brule  - 1 x daily - 7 x weekly - 3 sets - 10 reps - Seated Heel Raise  - 1 x daily - 7 x weekly - 3 sets - 10 reps - Seated Long Arc Quad  - 1 x daily - 7 x weekly - 3 sets - 10 reps - Ankle Inversion Eversion Towel Slide  - 1 x daily - 7 x weekly - 3 sets - 10 reps - Arch Lifting  - 1 x daily - 7 x weekly - 3 sets - 10 reps   ASSESSMENT:  CLINICAL IMPRESSION:  Pt continues to perform well with the tasks given during therapy session.  Pt session limited due to pt arriving late due to traffic.  Pt to continue with ambulation attempts and improving overall ROM with each therapy visit.  Pt advised that she could likely ambulate around the house with use of the cane as long as pain is not present at this time.  Pt still limited due to the ROM she is lacking and will continue to gently improve as part of current POC.   Pt will continue to benefit from skilled therapy to address remaining deficits in order to improve overall QoL and return to PLOF.        OBJECTIVE IMPAIRMENTS: Abnormal gait, decreased balance, decreased mobility, difficulty walking, decreased ROM, decreased strength, hypomobility, impaired flexibility, and pain.   ACTIVITY LIMITATIONS: standing, squatting, stairs, transfers, bed mobility, bathing, dressing, and locomotion level  PARTICIPATION LIMITATIONS: meal prep, cleaning, laundry, driving, shopping, community activity, yard work, and church  PERSONAL FACTORS: Age, Past/current experiences,  Time since onset of injury/illness/exacerbation, and 3+ comorbidities: arthritis, HTN, generalized osteoporosis  are also affecting patient's functional outcome.   REHAB POTENTIAL: Good  CLINICAL DECISION MAKING: Stable/uncomplicated  EVALUATION COMPLEXITY: Moderate   GOALS: Goals reviewed with patient? Yes  SHORT TERM GOALS: Target date: 02/24/2022    Pt will be independent with HEP in order to demonstrate increased ability to perform tasks related to occupation/hobbies. Baseline: Pt given HEP at initial evaluation; 03/05/2022- Patient verbalized compliance with HEP and able to return  demonstration of ankle ROM and LE strenghthening. Review general LE strengthening with patient and husband. Goal status: Progressing   LONG TERM GOALS: Target date: 04/21/2022  1.  Patient (> 9 years old) will complete five times sit to stand test in < 15 seconds indicating an increased LE strength and improved balance. Baseline: Not performed due to weightbearing restrictions. 03/05/2022- Deferred - waiting to discuss any restrictions with MD Goal status: INITIAL  2.  Patient will increase FOTO score to equal to or greater than  50   to demonstrate statistically significant improvement in mobility and quality of life.  Baseline: FOTO: 40 Goal status: INITIAL   3.  Patient will increase Berg Balance score by > 6 points to demonstrate decreased fall risk during functional activities. Baseline: Not performed due to time constraints. Goal status: INITIAL   4.  Patient will reduce timed up and go to <11 seconds to reduce fall risk and demonstrate improved transfer/gait ability. Baseline: 11.39 sec Goal status: INITIAL  5.  Patient will increase 10 meter walk test to >1.74m/s as to improve gait speed for better community ambulation and to reduce fall risk. Baseline: 18.59 sec - 0.54 m/s Goal status: INITIAL  6.  Patient will increase six minute walk test distance to >1000 for progression to community  ambulator and improve gait ability Baseline: Not performed at this time and will perform at later date. Goal status: INITIAL     PLAN:  PT FREQUENCY: 2x/week  PT DURATION: 12 weeks  PLANNED INTERVENTIONS: Therapeutic exercises, Therapeutic activity, Neuromuscular re-education, Balance training, Gait training, Patient/Family education, Self Care, Joint mobilization, Stair training, DME instructions, Aquatic Therapy, Dry Needling, Electrical stimulation, Cryotherapy, Moist heat, scar mobilization, Ultrasound, Parrafin, Fluidotherapy, and Manual therapy  PLAN FOR NEXT SESSION:   Continue with weightbearing if tolerated. Continue with manual therapy for ROM and pain relief. Add progressive LE strengthening as appropriate.    Nolon Bussing, PT, DPT Physical Therapist- [facility]  03/26/22, 5:58 PM

## 2022-03-31 ENCOUNTER — Ambulatory Visit: Payer: Medicare Other

## 2022-04-02 ENCOUNTER — Ambulatory Visit: Payer: Medicare Other

## 2022-04-02 DIAGNOSIS — M6281 Muscle weakness (generalized): Secondary | ICD-10-CM

## 2022-04-02 DIAGNOSIS — M25672 Stiffness of left ankle, not elsewhere classified: Secondary | ICD-10-CM

## 2022-04-02 DIAGNOSIS — M25572 Pain in left ankle and joints of left foot: Secondary | ICD-10-CM | POA: Diagnosis not present

## 2022-04-02 DIAGNOSIS — R52 Pain, unspecified: Secondary | ICD-10-CM

## 2022-04-02 DIAGNOSIS — R269 Unspecified abnormalities of gait and mobility: Secondary | ICD-10-CM

## 2022-04-02 DIAGNOSIS — R262 Difficulty in walking, not elsewhere classified: Secondary | ICD-10-CM

## 2022-04-02 NOTE — Therapy (Signed)
OUTPATIENT PHYSICAL THERAPY LOWER EXTREMITY TREATMENT   Patient Name: Hannah Tucker MRN: [id number] DOB:August 06, 1940, 82 y.o., female Today's Date: 04/02/2022  END OF SESSION:   PT End of Session - 04/02/22 1307     Visit Number 17    Number of Visits 24    Date for PT Re-Evaluation 04/21/22    Authorization Type Medicare A&B Primary    Progress Note Due on Visit 20    PT Start Time 1303    PT Stop Time 1345    PT Time Calculation (min) 42 min    Equipment Utilized During Treatment Gait belt    Activity Tolerance Patient tolerated treatment well;No increased pain    Behavior During Therapy WFL for tasks assessed/performed               Past Medical History:  Diagnosis Date   Arthritis    Complication of anesthesia    nausea   Dyspnea    with exertion   Family history of adverse reaction to anesthesia    PONV- MOM   Hypertension    Hypothyroidism    PONV (postoperative nausea and vomiting)    Sleep apnea    uses CPAP nightly   Stress incontinence    Valvular insufficiency, mitral    Past Surgical History:  Procedure Laterality Date   ANKLE ARTHROSCOPY WITH ARTHRODESIS Left 12/05/2021   Procedure: REPAIR OF TIBIAL NONUNION WITH AUTOGRAFT, TIBIOTALAR ARTHRODESIS;  Surgeon: Erle Crocker, MD;  Location: [place];  Service: Orthopedics;  Laterality: Left;   APPLICATION OF WOUND VAC Right 01/04/2021   Procedure: APPLICATION OF WOUND VAC;  Surgeon: Altamese Cygnet, MD;  Location: [place];  Service: Orthopedics;  Laterality: Right;   BREAST CYST ASPIRATION Bilateral    neg   BREAST EXCISIONAL BIOPSY Right    1970's   CATARACT EXTRACTION W/ INTRAOCULAR LENS  IMPLANT, BILATERAL Bilateral    COLONOSCOPY     DILATION AND CURETTAGE OF UTERUS     HARDWARE REMOVAL Left 12/05/2021   Procedure: LEFT ANKLE DEEP HARDWARE REMOVAL, MEDIAL AND LATERAL;  Surgeon: Erle Crocker, MD;  Location: [place];  Service: Orthopedics;  Laterality: Left;  LENGTH OF SURGERY: 150 MINUTES    I & D EXTREMITY Left 01/04/2021   Procedure: IRRIGATION AND DEBRIDEMENT LEFT ANKLE;  Surgeon: Altamese Payson, MD;  Location: [place];  Service: Orthopedics;  Laterality: Left;   I & D EXTREMITY Left 01/07/2021   Procedure: IRRIGATION AND DEBRIDEMENT EXTREMITY, Left ankle;  Surgeon: Altamese Woodway, MD;  Location: [place];  Service: Orthopedics;  Laterality: Left;   KNEE ARTHROPLASTY Left 11/24/2016   Procedure: COMPUTER ASSISTED TOTAL KNEE ARTHROPLASTY;  Surgeon: Dereck Leep, MD;  Location: [facility];  Service: Orthopedics;  Laterality: Left;   KNEE ARTHROPLASTY Right 12/31/2020   Procedure: COMPUTER ASSISTED TOTAL KNEE ARTHROPLASTY;  Surgeon: Dereck Leep, MD;  Location: [facility];  Service: Orthopedics;  Laterality: Right;   KNEE ARTHROSCOPY Left    KNEE ARTHROSCOPY Left 07/18/2015   Procedure: ARTHROSCOPY KNEE WITH MEDIAL COMPARTMENTAL MENICUS CHONDROPLASTY;  Surgeon: Dereck Leep, MD;  Location: [facility];  Service: Orthopedics;  Laterality: Left;   KNEE ARTHROSCOPY WITH LATERAL MENISECTOMY Left 07/18/2015   Procedure: LEFT KNEE ARTHROSCOPY WITH PARTIAL LATERAL MENISECTOMY GRADE 4 CHONDROMYLASIA LATERAL TIBIAL PLATEAU;  Surgeon: Dereck Leep, MD;  Location: [facility];  Service: Orthopedics;  Laterality: Left;   ORIF ANKLE FRACTURE Left 01/04/2021   Procedure: OPEN REDUCTION INTERNAL FIXATION (ORIF) ANKLE FRACTURE;  Surgeon:  Myrene Galas, MD;  Location: [facility];  Service: Orthopedics;  Laterality: Left;   ORIF ANKLE FRACTURE Left 01/07/2021   Procedure: SCREW REMOVAL ANKLE REMOVAL OF WOUND VAC LEFT LEG;  Surgeon: Myrene Galas, MD;  Location: [facility];  Service: Orthopedics;  Laterality: Left;   Patient Active Problem List   Diagnosis Date Noted   Arthritis of left ankle 12/05/2021   Sleep disturbance    Acute blood loss anemia 01/18/2021   Hypokalemia 01/18/2021   Constipation 01/18/2021   Trauma 01/08/2021   Open left ankle fracture, sequela 01/03/2021   Essential hypertension  01/03/2021   Hypothyroidism 01/03/2021   Total knee replacement status 12/31/2020   Family history of heart disease 04/12/2018   History of total knee arthroplasty, left 11/24/2016   Adult idiopathic generalized osteoporosis 03/01/2015   OSA on CPAP 01/30/2014    PCP: Bethann Punches, MD  REFERRING PROVIDER: Terance Hart, MD  REFERRING DIAG: Left tibiotalar arthrodesis with repair of tibial nonunion and hardware removal, 12/06/21  THERAPY DIAG:  Pain in left ankle and joints of left foot  Abnormality of gait and mobility  Stiffness of left ankle, not elsewhere classified  Muscle weakness (generalized)  Difficulty in walking, not elsewhere classified  Pain  Rationale for Evaluation and Treatment: Rehabilitation  ONSET DATE: 12/06/21   SUBJECTIVE:   SUBJECTIVE STATEMENT:  Pt reports that she was not able to come on Monday due to getting their heat fixed.  Pt states that they now have heat and are very appreciative of having it.  Pt states that she has been walking with the cane a little bit, with the brace.      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 PMH includes HTN, valvular insufficiency, SUI.    PAIN:  Are you having pain? Yes: NPRS scale: 2/10 Pain location: more in the ankle Pain description: soreness Aggravating factors: Being on the knee scooter is more problematic on the surgically fixed knee.  PRECAUTIONS: Fall  WEIGHT BEARING RESTRICTIONS: Other: Now Weight bearing as tolerated - current using a walking boot  FALLS:  Has patient fallen in last 6 months? No  LIVING ENVIRONMENT: Lives with: lives with their spouse Lives in: House/apartment Stairs: Yes: Internal: 15 steps; on left going up and External: 4 steps; bilateral but cannot reach both Has following  equipment at home: Single point cane, Walker - 2 wheeled, Environmental consultant - 4 wheeled, Crutches, Tour manager, bed side commode, and Knee Scooter  OCCUPATION: Retired  PLOF: Independent  PATIENT GOALS: Pt wants to be able to go up/down the flight of steps.  NEXT MD VISIT:   OBJECTIVE:   DIAGNOSTIC FINDINGS:   CLINICAL DATA:  Fluoroscopic assistance for revision of internal fixation in left ankle   EXAM: LEFT ANKLE COMPLETE - 3+ VIEW   COMPARISON:  CT done on 09/24/2021   FINDINGS: There is interval placement of a new longer screw transfixing the medial malleolus. There is a new metallic plate along the anterior margin of distal tibia anchored to tibia and talus. Fluoroscopic time 1 minute and 11 seconds. Radiation dose 1.46 mGy.   IMPRESSION: Fluoroscopic assistance was provided for revision of internal fixation in left ankle.  PATIENT SURVEYS:  FOTO 40/50  COGNITION: Overall cognitive status: Within functional limits for tasks assessed     SENSATION: WFL  PALPATION:  No tenderness surrounding the ankle  LOWER EXTREMITY ROM:  Active ROM Right eval Left eval  Ankle dorsiflexion  2 deg  Ankle plantarflexion  13 deg  Ankle inversion  7 deg  Ankle eversion  3 deg   (Blank rows = not tested)   LOWER EXTREMITY SPECIAL TESTS:   Ankle special tests: Not performed at this time   FUNCTIONAL TESTS:   5 times sit to stand: 19.41 sec Timed up and go (TUG): 18.25 sec 10 meter walk test: 18.54 sec Dynamic Gait Index: Not performed due to weightbearing restrictions.   GAIT: Distance walked: Not performed due to weightbearing restrictions.    TODAY'S TREATMENT: DATE: 04/02/22   There.Ex:   Ambulation around the gym with use of SPC and no ankle brace, x3 laps with no rest break  Lunges on steps with each LE leading, 30 sec holds Forward step ups at stairs, x2 sets each LE leading Lateral step ups at stairs, x2 sets each LE leading Static stance on airex pad,  30 sec bouts NBOS on airex pad, 30 sec bouts Staggered stance on airex pads, 30 sec bouts with each LE leading NBOS on airex pad with eyes closed, 30 sec bouts  Ambulation around the gym with use of SPC and no ankle brace, x1 lap with no rest break    PATIENT EDUCATION:  Education details: Pt educated on role of PT and services provided during current POC, along with prognosis and information about the clinic.  Person educated: Patient and Spouse Education method: Explanation Education comprehension: verbalized understanding  HOME EXERCISE PROGRAM:  Access Code: [email or web address] URL: [email or web address] Date: 01/27/2022 Prepared by: [place]  - 1 x daily - 7 x weekly - 3 sets - 10 reps - Seated Heel Raise  - 1 x daily - 7 x weekly - 3 sets - 10 reps - Seated Long Arc Quad  - 1 x daily - 7 x weekly - 3 sets - 10 reps - Ankle Inversion Eversion Towel Slide  - 1 x daily - 7 x weekly - 3 sets - 10 reps - Arch Lifting  - 1 x daily - 7 x weekly - 3 sets - 10 reps   ASSESSMENT:  CLINICAL IMPRESSION:  Pt performed well with the tasks above, noting some discomfort at the conclusion of the session.  Pt states pain to be no more than 3/10 however.  Pt reported that her husband and herself would be going grocery shopping after therapy, and was advised that maybe utilizing the motorized chairs would be best for the ankle to rest after being upright on it during therapy.  Pt is making good progress with stability on unstable surfaces and will continue to progress with ROM and strengthening program as part of current POC.   Pt will continue to benefit from skilled therapy to address remaining deficits in order to improve overall QoL and return to PLOF.         OBJECTIVE IMPAIRMENTS: Abnormal gait, decreased balance, decreased mobility, difficulty walking, decreased ROM, decreased strength, hypomobility, impaired flexibility, and pain.   ACTIVITY  LIMITATIONS: standing, squatting, stairs, transfers, bed mobility, bathing, dressing, and locomotion level  PARTICIPATION LIMITATIONS: meal prep, cleaning, laundry, driving, shopping, community activity, yard work, and church  PERSONAL FACTORS: Age, Past/current experiences, Time since onset of injury/illness/exacerbation, and 3+ comorbidities: arthritis, HTN, generalized osteoporosis  are also affecting patient's functional outcome.   REHAB POTENTIAL: Good  CLINICAL DECISION MAKING: Stable/uncomplicated  EVALUATION  COMPLEXITY: Moderate   GOALS: Goals reviewed with patient? Yes  SHORT TERM GOALS: Target date: 02/24/2022    Pt will be independent with HEP in order to demonstrate increased ability to perform tasks related to occupation/hobbies. Baseline: Pt given HEP at initial evaluation; 03/05/2022- Patient verbalized compliance with HEP and able to return demonstration of ankle ROM and LE strenghthening. Review general LE strengthening with patient and husband. Goal status: Progressing   LONG TERM GOALS: Target date: 04/21/2022  1.  Patient (> 81 years old) will complete five times sit to stand test in < 15 seconds indicating an increased LE strength and improved balance. Baseline: Not performed due to weightbearing restrictions. 03/05/2022- Deferred - waiting to discuss any restrictions with MD Goal status: INITIAL  2.  Patient will increase FOTO score to equal to or greater than  50   to demonstrate statistically significant improvement in mobility and quality of life.  Baseline: FOTO: 40 Goal status: INITIAL   3.  Patient will increase Berg Balance score by > 6 points to demonstrate decreased fall risk during functional activities. Baseline: Not performed due to time constraints. Goal status: INITIAL   4.  Patient will reduce timed up and go to <11 seconds to reduce fall risk and demonstrate improved transfer/gait ability. Baseline: 11.39 sec Goal status: INITIAL  5.  Patient  will increase 10 meter walk test to >1.19m/s as to improve gait speed for better community ambulation and to reduce fall risk. Baseline: 18.59 sec - 0.54 m/s Goal status: INITIAL  6.  Patient will increase six minute walk test distance to >1000 for progression to community ambulator and improve gait ability Baseline: Not performed at this time and will perform at later date. Goal status: INITIAL     PLAN:  PT FREQUENCY: 2x/week  PT DURATION: 12 weeks  PLANNED INTERVENTIONS: Therapeutic exercises, Therapeutic activity, Neuromuscular re-education, Balance training, Gait training, Patient/Family education, Self Care, Joint mobilization, Stair training, DME instructions, Aquatic Therapy, Dry Needling, Electrical stimulation, Cryotherapy, Moist heat, scar mobilization, Ultrasound, Parrafin, Fluidotherapy, and Manual therapy  PLAN FOR NEXT SESSION:   Continue with weightbearing if tolerated. Continue with manual therapy for ROM and pain relief. Add progressive LE strengthening as appropriate.    Gwenlyn Saran, PT, DPT Physical Therapist- [facility]  04/02/22, 3:10 PM

## 2022-04-04 ENCOUNTER — Ambulatory Visit: Payer: Medicare Other | Attending: Orthopaedic Surgery | Admitting: Physical Therapy

## 2022-04-04 ENCOUNTER — Encounter: Payer: Self-pay | Admitting: Physical Therapy

## 2022-04-04 DIAGNOSIS — R52 Pain, unspecified: Secondary | ICD-10-CM | POA: Diagnosis present

## 2022-04-04 DIAGNOSIS — M25672 Stiffness of left ankle, not elsewhere classified: Secondary | ICD-10-CM

## 2022-04-04 DIAGNOSIS — M6281 Muscle weakness (generalized): Secondary | ICD-10-CM

## 2022-04-04 DIAGNOSIS — R269 Unspecified abnormalities of gait and mobility: Secondary | ICD-10-CM | POA: Diagnosis present

## 2022-04-04 DIAGNOSIS — M25572 Pain in left ankle and joints of left foot: Secondary | ICD-10-CM | POA: Diagnosis present

## 2022-04-04 DIAGNOSIS — R262 Difficulty in walking, not elsewhere classified: Secondary | ICD-10-CM

## 2022-04-04 NOTE — Therapy (Signed)
OUTPATIENT PHYSICAL THERAPY LOWER EXTREMITY TREATMENT   Patient Name: Hannah Tucker MRN: [id number] DOB:1940/12/17, 82 y.o., female Today's Date: 04/04/2022  END OF SESSION:   PT End of Session - 04/04/22 1028     Visit Number 18    Number of Visits 24    Date for PT Re-Evaluation 04/21/22    Authorization Type Medicare A&B Primary    Progress Note Due on Visit 20    PT Start Time 1027    PT Stop Time 1110    PT Time Calculation (min) 43 min    Equipment Utilized During Treatment Gait belt    Activity Tolerance Patient tolerated treatment well;No increased pain    Behavior During Therapy WFL for tasks assessed/performed                Past Medical History:  Diagnosis Date   Arthritis    Complication of anesthesia    nausea   Dyspnea    with exertion   Family history of adverse reaction to anesthesia    PONV- MOM   Hypertension    Hypothyroidism    PONV (postoperative nausea and vomiting)    Sleep apnea    uses CPAP nightly   Stress incontinence    Valvular insufficiency, mitral    Past Surgical History:  Procedure Laterality Date   ANKLE ARTHROSCOPY WITH ARTHRODESIS Left 12/05/2021   Procedure: REPAIR OF TIBIAL NONUNION WITH AUTOGRAFT, TIBIOTALAR ARTHRODESIS;  Surgeon: Erle Crocker, MD;  Location: [place];  Service: Orthopedics;  Laterality: Left;   APPLICATION OF WOUND VAC Right 01/04/2021   Procedure: APPLICATION OF WOUND VAC;  Surgeon: Altamese Frederick, MD;  Location: [place];  Service: Orthopedics;  Laterality: Right;   BREAST CYST ASPIRATION Bilateral    neg   BREAST EXCISIONAL BIOPSY Right    1970's   CATARACT EXTRACTION W/ INTRAOCULAR LENS  IMPLANT, BILATERAL Bilateral    COLONOSCOPY     DILATION AND CURETTAGE OF UTERUS     HARDWARE REMOVAL Left 12/05/2021   Procedure: LEFT ANKLE DEEP HARDWARE REMOVAL, MEDIAL AND LATERAL;  Surgeon: Erle Crocker, MD;  Location: [place];  Service: Orthopedics;  Laterality: Left;  LENGTH OF SURGERY: 150 MINUTES    I & D EXTREMITY Left 01/04/2021   Procedure: IRRIGATION AND DEBRIDEMENT LEFT ANKLE;  Surgeon: Altamese Lansdale, MD;  Location: [place];  Service: Orthopedics;  Laterality: Left;   I & D EXTREMITY Left 01/07/2021   Procedure: IRRIGATION AND DEBRIDEMENT EXTREMITY, Left ankle;  Surgeon: Altamese Arcola, MD;  Location: [place];  Service: Orthopedics;  Laterality: Left;   KNEE ARTHROPLASTY Left 11/24/2016   Procedure: COMPUTER ASSISTED TOTAL KNEE ARTHROPLASTY;  Surgeon: Dereck Leep, MD;  Location: [facility];  Service: Orthopedics;  Laterality: Left;   KNEE ARTHROPLASTY Right 12/31/2020   Procedure: COMPUTER ASSISTED TOTAL KNEE ARTHROPLASTY;  Surgeon: Dereck Leep, MD;  Location: [facility];  Service: Orthopedics;  Laterality: Right;   KNEE ARTHROSCOPY Left    KNEE ARTHROSCOPY Left 07/18/2015   Procedure: ARTHROSCOPY KNEE WITH MEDIAL COMPARTMENTAL MENICUS CHONDROPLASTY;  Surgeon: Dereck Leep, MD;  Location: [facility];  Service: Orthopedics;  Laterality: Left;   KNEE ARTHROSCOPY WITH LATERAL MENISECTOMY Left 07/18/2015   Procedure: LEFT KNEE ARTHROSCOPY WITH PARTIAL LATERAL MENISECTOMY GRADE 4 CHONDROMYLASIA LATERAL TIBIAL PLATEAU;  Surgeon: Dereck Leep, MD;  Location: [facility];  Service: Orthopedics;  Laterality: Left;   ORIF ANKLE FRACTURE Left 01/04/2021   Procedure: OPEN REDUCTION INTERNAL FIXATION (ORIF) ANKLE FRACTURE;  Surgeon: Myrene Galas, MD;  Location: [facility];  Service: Orthopedics;  Laterality: Left;   ORIF ANKLE FRACTURE Left 01/07/2021   Procedure: SCREW REMOVAL ANKLE REMOVAL OF WOUND VAC LEFT LEG;  Surgeon: Myrene Galas, MD;  Location: [facility];  Service: Orthopedics;  Laterality: Left;   Patient Active Problem List   Diagnosis Date Noted   Arthritis of left ankle 12/05/2021   Sleep disturbance    Acute blood loss anemia 01/18/2021   Hypokalemia 01/18/2021   Constipation 01/18/2021   Trauma 01/08/2021   Open left ankle fracture, sequela 01/03/2021   Essential hypertension  01/03/2021   Hypothyroidism 01/03/2021   Total knee replacement status 12/31/2020   Family history of heart disease 04/12/2018   History of total knee arthroplasty, left 11/24/2016   Adult idiopathic generalized osteoporosis 03/01/2015   OSA on CPAP 01/30/2014    PCP: Bethann Punches, MD  REFERRING PROVIDER: Terance Hart, MD  REFERRING DIAG: Left tibiotalar arthrodesis with repair of tibial nonunion and hardware removal, 12/06/21  THERAPY DIAG:  Pain in left ankle and joints of left foot  Abnormality of gait and mobility  Stiffness of left ankle, not elsewhere classified  Muscle weakness (generalized)  Difficulty in walking, not elsewhere classified  Pain  Rationale for Evaluation and Treatment: Rehabilitation  ONSET DATE: 12/06/21   SUBJECTIVE:   SUBJECTIVE STATEMENT:  Pt reports her husband had the time wrong which made them a few minutes late. Also reports heel pain since last session.     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 PMH includes HTN, valvular insufficiency, SUI.    PAIN:  Are you having pain? Yes: NPRS scale: 2/10 Pain location: more in the ankle Pain description: soreness Aggravating factors: Being on the knee scooter is more problematic on the surgically fixed knee.  PRECAUTIONS: Fall  WEIGHT BEARING RESTRICTIONS: Other: Now Weight bearing as tolerated - current using a walking boot  FALLS:  Has patient fallen in last 6 months? No  LIVING ENVIRONMENT: Lives with: lives with their spouse Lives in: House/apartment Stairs: Yes: Internal: 15 steps; on left going up and External: 4 steps; bilateral but cannot reach both Has following equipment at home: Single point cane, Walker - 2 wheeled, Environmental consultant - 4 wheeled, Crutches, Tour manager, bed side commode, and  Knee Scooter  OCCUPATION: Retired  PLOF: Independent  PATIENT GOALS: Pt wants to be able to go up/down the flight of steps.  NEXT MD VISIT:   OBJECTIVE:   DIAGNOSTIC FINDINGS:   CLINICAL DATA:  Fluoroscopic assistance for revision of internal fixation in left ankle   EXAM: LEFT ANKLE COMPLETE - 3+ VIEW   COMPARISON:  CT done on 09/24/2021   FINDINGS: There is interval placement of a new longer screw transfixing the medial malleolus. There is a new metallic plate along the anterior margin of distal tibia anchored to tibia and talus. Fluoroscopic time 1 minute and 11 seconds. Radiation dose 1.46 mGy.   IMPRESSION: Fluoroscopic assistance was provided for revision of internal fixation in left ankle.  PATIENT SURVEYS:  FOTO 40/50  COGNITION: Overall cognitive status: Within functional limits for tasks assessed     SENSATION: WFL  PALPATION: No tenderness surrounding the ankle  LOWER EXTREMITY ROM:  Active ROM Right eval Left eval  Ankle dorsiflexion  2 deg  Ankle plantarflexion  13  deg  Ankle inversion  7 deg  Ankle eversion  3 deg   (Blank rows = not tested)   LOWER EXTREMITY SPECIAL TESTS:   Ankle special tests: Not performed at this time   FUNCTIONAL TESTS:   5 times sit to stand: 19.41 sec Timed up and go (TUG): 18.25 sec 10 meter walk test: 18.54 sec Dynamic Gait Index: Not performed due to weightbearing restrictions.   GAIT: Distance walked: Not performed due to weightbearing restrictions.    TODAY'S TREATMENT: DATE: 04/04/22   There.Ex:  Pt doffs ASO brace to start session  Ambulation around the gym with use of SPC and no ankle brace, x3 laps with no rest break, no increase in pain noted  Gentle manual stretch with great to extension and DF for stretch x 2 min, no increase or decrease in heel pain.   Step taps to 6 inch step no UE support  Forward step ups at stairs, x2 sets each LE leading Lateral step ups at stairs, x2 sets  each LE leading NBOS on airex pad, 30 sec bouts Staggered stance on airex pads, 3 x 30 sec bouts with LLE posterior  NBOS on airex pad with ball toss 2 x 10 reps with pt husband tossing ball.  Pt donned ASO brace and has minimal pain in lateral ankle that was not present prior.     PATIENT EDUCATION:  Education details: Pt educated on role of PT and services provided during current POC, along with prognosis and information about the clinic.  Person educated: Patient and Spouse Education method: Explanation Education comprehension: verbalized understanding  HOME EXERCISE PROGRAM:  Access Code: [email or web address] URL: [email or web address] Date: 01/27/2022 Prepared by: Banks  - 1 x daily - 7 x weekly - 3 sets - 10 reps - Seated Heel Raise  - 1 x daily - 7 x weekly - 3 sets - 10 reps - Seated Long Arc Quad  - 1 x daily - 7 x weekly - 3 sets - 10 reps - Ankle Inversion Eversion Towel Slide  - 1 x daily - 7 x weekly - 3 sets - 10 reps - Arch Lifting  - 1 x daily - 7 x weekly - 3 sets - 10 reps   ASSESSMENT:  CLINICAL IMPRESSION:  Continued with current plan of care as laid out in evaluation and recent prior sessions. Pt remains motivated to advance progress toward goals in order to maximize independence and safety at home.Pt reports similar pain levels at end of session compared to prior to session. Pt progresses with balance interventions to re educate ankle righting reaction strategies without ASO ankle brace with good results. Pt will continue to benefit from skilled physical therapy intervention to address impairments, improve QOL, and attain therapy goals.          OBJECTIVE IMPAIRMENTS: Abnormal gait, decreased balance, decreased mobility, difficulty walking, decreased ROM, decreased strength, hypomobility, impaired flexibility, and pain.   ACTIVITY LIMITATIONS: standing, squatting, stairs, transfers, bed mobility, bathing,  dressing, and locomotion level  PARTICIPATION LIMITATIONS: meal prep, cleaning, laundry, driving, shopping, community activity, yard work, and church  PERSONAL FACTORS: Age, Past/current experiences, Time since onset of injury/illness/exacerbation, and 3+ comorbidities: arthritis, HTN, generalized osteoporosis  are also affecting patient's functional outcome.   REHAB POTENTIAL: Good  CLINICAL DECISION MAKING: Stable/uncomplicated  EVALUATION COMPLEXITY: Moderate   GOALS: Goals reviewed with patient? Yes  SHORT TERM GOALS: Target date: 02/24/2022    Pt will be independent with  HEP in order to demonstrate increased ability to perform tasks related to occupation/hobbies. Baseline: Pt given HEP at initial evaluation; 03/05/2022- Patient verbalized compliance with HEP and able to return demonstration of ankle ROM and LE strenghthening. Review general LE strengthening with patient and husband. Goal status: Progressing   LONG TERM GOALS: Target date: 04/21/2022  1.  Patient (> 87 years old) will complete five times sit to stand test in < 15 seconds indicating an increased LE strength and improved balance. Baseline: Not performed due to weightbearing restrictions. 03/05/2022- Deferred - waiting to discuss any restrictions with MD Goal status: INITIAL  2.  Patient will increase FOTO score to equal to or greater than  50   to demonstrate statistically significant improvement in mobility and quality of life.  Baseline: FOTO: 40 Goal status: INITIAL   3.  Patient will increase Berg Balance score by > 6 points to demonstrate decreased fall risk during functional activities. Baseline: Not performed due to time constraints. Goal status: INITIAL   4.  Patient will reduce timed up and go to <11 seconds to reduce fall risk and demonstrate improved transfer/gait ability. Baseline: 11.39 sec Goal status: INITIAL  5.  Patient will increase 10 meter walk test to >1.79m/s as to improve gait speed for  better community ambulation and to reduce fall risk. Baseline: 18.59 sec - 0.54 m/s Goal status: INITIAL  6.  Patient will increase six minute walk test distance to >1000 for progression to community ambulator and improve gait ability Baseline: Not performed at this time and will perform at later date. Goal status: INITIAL     PLAN:  PT FREQUENCY: 2x/week  PT DURATION: 12 weeks  PLANNED INTERVENTIONS: Therapeutic exercises, Therapeutic activity, Neuromuscular re-education, Balance training, Gait training, Patient/Family education, Self Care, Joint mobilization, Stair training, DME instructions, Aquatic Therapy, Dry Needling, Electrical stimulation, Cryotherapy, Moist heat, scar mobilization, Ultrasound, Parrafin, Fluidotherapy, and Manual therapy  PLAN FOR NEXT SESSION:   Continue with weightbearing if tolerated. Continue with manual therapy for ROM and pain relief. Add progressive LE strengthening as appropriate.    Particia Lather PT ,DPT Physical Therapist- [facility]   04/04/22, 11:31 AM

## 2022-04-07 ENCOUNTER — Ambulatory Visit: Payer: Medicare Other

## 2022-04-07 DIAGNOSIS — M25672 Stiffness of left ankle, not elsewhere classified: Secondary | ICD-10-CM

## 2022-04-07 DIAGNOSIS — M25572 Pain in left ankle and joints of left foot: Secondary | ICD-10-CM | POA: Diagnosis not present

## 2022-04-07 DIAGNOSIS — M6281 Muscle weakness (generalized): Secondary | ICD-10-CM

## 2022-04-07 DIAGNOSIS — R52 Pain, unspecified: Secondary | ICD-10-CM

## 2022-04-07 DIAGNOSIS — R262 Difficulty in walking, not elsewhere classified: Secondary | ICD-10-CM

## 2022-04-07 DIAGNOSIS — R269 Unspecified abnormalities of gait and mobility: Secondary | ICD-10-CM

## 2022-04-07 NOTE — Therapy (Signed)
OUTPATIENT PHYSICAL THERAPY LOWER EXTREMITY TREATMENT   Patient Name: Hannah Tucker MRN: [id number] DOB:November 28, 1940, 82 y.o., female Today's Date: 04/07/2022  END OF SESSION:   PT End of Session - 04/07/22 1106     Visit Number 19    Number of Visits 24    Date for PT Re-Evaluation 04/21/22    Authorization Type Medicare A&B Primary    Progress Note Due on Visit 20    PT Start Time 1103    PT Stop Time 1145    PT Time Calculation (min) 42 min    Equipment Utilized During Treatment Gait belt    Activity Tolerance Patient tolerated treatment well;No increased pain    Behavior During Therapy WFL for tasks assessed/performed                 Past Medical History:  Diagnosis Date   Arthritis    Complication of anesthesia    nausea   Dyspnea    with exertion   Family history of adverse reaction to anesthesia    PONV- MOM   Hypertension    Hypothyroidism    PONV (postoperative nausea and vomiting)    Sleep apnea    uses CPAP nightly   Stress incontinence    Valvular insufficiency, mitral    Past Surgical History:  Procedure Laterality Date   ANKLE ARTHROSCOPY WITH ARTHRODESIS Left 12/05/2021   Procedure: REPAIR OF TIBIAL NONUNION WITH AUTOGRAFT, TIBIOTALAR ARTHRODESIS;  Surgeon: Terance Hart, MD;  Location: [facility];  Service: Orthopedics;  Laterality: Left;   APPLICATION OF WOUND VAC Right 01/04/2021   Procedure: APPLICATION OF WOUND VAC;  Surgeon: Myrene Galas, MD;  Location: [facility];  Service: Orthopedics;  Laterality: Right;   BREAST CYST ASPIRATION Bilateral    neg   BREAST EXCISIONAL BIOPSY Right    1970's   CATARACT EXTRACTION W/ INTRAOCULAR LENS  IMPLANT, BILATERAL Bilateral    COLONOSCOPY     DILATION AND CURETTAGE OF UTERUS     HARDWARE REMOVAL Left 12/05/2021   Procedure: LEFT ANKLE DEEP HARDWARE REMOVAL, MEDIAL AND LATERAL;  Surgeon: Terance Hart, MD;  Location: [facility];  Service: Orthopedics;  Laterality: Left;  LENGTH OF SURGERY: 150  MINUTES   I & D EXTREMITY Left 01/04/2021   Procedure: IRRIGATION AND DEBRIDEMENT LEFT ANKLE;  Surgeon: Myrene Galas, MD;  Location: [facility];  Service: Orthopedics;  Laterality: Left;   I & D EXTREMITY Left 01/07/2021   Procedure: IRRIGATION AND DEBRIDEMENT EXTREMITY, Left ankle;  Surgeon: Myrene Galas, MD;  Location: [facility] OR;  Service: Orthopedics;  Laterality: Left;   KNEE ARTHROPLASTY Left 11/24/2016   Procedure: COMPUTER ASSISTED TOTAL KNEE ARTHROPLASTY;  Surgeon: Donato Heinz, MD;  Location: [facility];  Service: Orthopedics;  Laterality: Left;   KNEE ARTHROPLASTY Right 12/31/2020   Procedure: COMPUTER ASSISTED TOTAL KNEE ARTHROPLASTY;  Surgeon: Donato Heinz, MD;  Location: [facility];  Service: Orthopedics;  Laterality: Right;   KNEE ARTHROSCOPY Left    KNEE ARTHROSCOPY Left 07/18/2015   Procedure: ARTHROSCOPY KNEE WITH MEDIAL COMPARTMENTAL MENICUS CHONDROPLASTY;  Surgeon: Donato Heinz, MD;  Location: [facility];  Service: Orthopedics;  Laterality: Left;   KNEE ARTHROSCOPY WITH LATERAL MENISECTOMY Left 07/18/2015   Procedure: LEFT KNEE ARTHROSCOPY WITH PARTIAL LATERAL MENISECTOMY GRADE 4 CHONDROMYLASIA LATERAL TIBIAL PLATEAU;  Surgeon: Donato Heinz, MD;  Location: [facility];  Service: Orthopedics;  Laterality: Left;   ORIF ANKLE FRACTURE Left 01/04/2021   Procedure: OPEN REDUCTION INTERNAL FIXATION (ORIF) ANKLE FRACTURE;  Surgeon: Altamese Declo, MD;  Location: [place];  Service: Orthopedics;  Laterality: Left;   ORIF ANKLE FRACTURE Left 01/07/2021   Procedure: SCREW REMOVAL ANKLE REMOVAL OF WOUND VAC LEFT LEG;  Surgeon: Altamese Higgston, MD;  Location: [place];  Service: Orthopedics;  Laterality: Left;   Patient Active Problem List   Diagnosis Date Noted   Arthritis of left ankle 12/05/2021   Sleep disturbance    Acute blood loss anemia 01/18/2021   Hypokalemia 01/18/2021   Constipation 01/18/2021   Trauma 01/08/2021   Open left ankle fracture, sequela 01/03/2021   Essential hypertension  01/03/2021   Hypothyroidism 01/03/2021   Total knee replacement status 12/31/2020   Family history of heart disease 04/12/2018   History of total knee arthroplasty, left 11/24/2016   Adult idiopathic generalized osteoporosis 03/01/2015   OSA on CPAP 01/30/2014    PCP: Emily Filbert, MD  REFERRING PROVIDER: Erle Crocker, MD  REFERRING DIAG: Left tibiotalar arthrodesis with repair of tibial nonunion and hardware removal, 12/06/21  THERAPY DIAG:  Pain in left ankle and joints of left foot  Abnormality of gait and mobility  Stiffness of left ankle, not elsewhere classified  Muscle weakness (generalized)  Difficulty in walking, not elsewhere classified  Pain  Rationale for Evaluation and Treatment: Rehabilitation  ONSET DATE: 12/06/21   SUBJECTIVE:   SUBJECTIVE STATEMENT:  Pt reports that she may have to attempt to drive for the first time today in a while due to [place] having an eye appointment.  Pt states otherwise she is doing well.      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 PMH includes HTN, valvular insufficiency, SUI.    PAIN:  Are you having pain? Yes: NPRS scale: 2/10 Pain location: more in the ankle Pain description: soreness Aggravating factors: Being on the knee scooter is more problematic on the surgically fixed knee.  PRECAUTIONS: Fall  WEIGHT BEARING RESTRICTIONS: Other: Now Weight bearing as tolerated - current using a walking boot  FALLS:  Has patient fallen in last 6 months? No  LIVING ENVIRONMENT: Lives with: lives with their spouse Lives in: House/apartment Stairs: Yes: Internal: 15 steps; on left going up and External: 4 steps; bilateral but cannot reach both Has following equipment at home: Single point cane, Walker - 2 wheeled, Environmental consultant - 4 wheeled,  Crutches, Electronics engineer, bed side commode, and Knee Scooter  OCCUPATION: Retired  PLOF: Independent  PATIENT GOALS: Pt wants to be able to go up/down the flight of steps.  NEXT MD VISIT:   OBJECTIVE:   DIAGNOSTIC FINDINGS:   CLINICAL DATA:  Fluoroscopic assistance for revision of internal fixation in left ankle   EXAM: LEFT ANKLE COMPLETE - 3+ VIEW   COMPARISON:  CT done on 09/24/2021   FINDINGS: There is interval placement of a new longer screw transfixing the medial malleolus. There is a new metallic plate along the anterior margin of distal tibia anchored to tibia and talus. Fluoroscopic time 1 minute and 11 seconds. Radiation dose 1.46 mGy.   IMPRESSION: Fluoroscopic assistance was provided for revision of internal fixation in left ankle.  PATIENT SURVEYS:  FOTO 40/50  COGNITION: Overall cognitive status: Within functional limits for tasks assessed     SENSATION: WFL  PALPATION: No tenderness surrounding the ankle  LOWER EXTREMITY ROM:  Active ROM Right eval Left  eval  Ankle dorsiflexion  2 deg  Ankle plantarflexion  13 deg  Ankle inversion  7 deg  Ankle eversion  3 deg   (Blank rows = not tested)   LOWER EXTREMITY SPECIAL TESTS:   Ankle special tests: Not performed at this time   FUNCTIONAL TESTS:   5 times sit to stand: 19.41 sec Timed up and go (TUG): 18.25 sec 10 meter walk test: 18.54 sec Dynamic Gait Index: Not performed due to weightbearing restrictions.   GAIT: Distance walked: Not performed due to weightbearing restrictions.    TODAY'S TREATMENT: DATE: 04/07/22   There.Ex:  Ambulation around the gym, down EVS hallway, past cafeteria, and back to gym with use of SPC and no ankle brace, x1 lap with no rest break, no increase in pain noted.  Gentle manual stretch with great toe extension and DF for stretch x 2 min, no increase or decrease in heel pain.   Step taps to 6 inch step no UE support  Forward step ups at stairs, x2  sets each LE leading Lateral step ups at stairs, x2 sets each LE leading NBOS on airex pad, 30 sec bouts Blazepod activity to simulate switching pedals, while seated and foot placed on incline board, 30 hits total Standing blazepod random taps (3 pods) placed on 6' step with use of B LE for tapping.        PATIENT EDUCATION:  Education details: Pt educated on role of PT and services provided during current POC, along with prognosis and information about the clinic.  Person educated: Patient and Spouse Education method: Explanation Education comprehension: verbalized understanding  HOME EXERCISE PROGRAM:  Access Code: [email or web address] URL: [email or web address] Date: 01/27/2022 Prepared by: [place]  - 1 x daily - 7 x weekly - 3 sets - 10 reps - Seated Heel Raise  - 1 x daily - 7 x weekly - 3 sets - 10 reps - Seated Long Arc Quad  - 1 x daily - 7 x weekly - 3 sets - 10 reps - Ankle Inversion Eversion Towel Slide  - 1 x daily - 7 x weekly - 3 sets - 10 reps - Arch Lifting  - 1 x daily - 7 x weekly - 3 sets - 10 reps   ASSESSMENT:  CLINICAL IMPRESSION:  Pt is performing well with current plan and is making good progress with tolerance when on feet without the brace and using AD's that enable greater degrees of freedom when ambulating.  Pt continues to describe being more mobile outside of the clinic and being able to do more as expected.  Pt encouraged to progress as we have discussed prior, utilizing the brace and cane together when at home.   Pt will continue to benefit from skilled therapy to address remaining deficits in order to improve overall QoL and return to PLOF.      OBJECTIVE IMPAIRMENTS: Abnormal gait, decreased balance, decreased mobility, difficulty walking, decreased ROM, decreased strength, hypomobility, impaired flexibility, and pain.   ACTIVITY LIMITATIONS: standing, squatting, stairs, transfers, bed mobility, bathing,  dressing, and locomotion level  PARTICIPATION LIMITATIONS: meal prep, cleaning, laundry, driving, shopping, community activity, yard work, and church  PERSONAL FACTORS: Age, Past/current experiences, Time since onset of injury/illness/exacerbation, and 3+ comorbidities: arthritis, HTN, generalized osteoporosis  are also affecting patient's functional outcome.   REHAB POTENTIAL: Good  CLINICAL DECISION MAKING: Stable/uncomplicated  EVALUATION COMPLEXITY: Moderate   GOALS: Goals reviewed with patient? Yes  SHORT TERM GOALS: Target  date: 02/24/2022    Pt will be independent with HEP in order to demonstrate increased ability to perform tasks related to occupation/hobbies. Baseline: Pt given HEP at initial evaluation; 03/05/2022- Patient verbalized compliance with HEP and able to return demonstration of ankle ROM and LE strenghthening. Review general LE strengthening with patient and husband. Goal status: Progressing   LONG TERM GOALS: Target date: 04/21/2022  1.  Patient (> 81 years old) will complete five times sit to stand test in < 15 seconds indicating an increased LE strength and improved balance. Baseline: Not performed due to weightbearing restrictions. 03/05/2022- Deferred - waiting to discuss any restrictions with MD Goal status: INITIAL  2.  Patient will increase FOTO score to equal to or greater than  50   to demonstrate statistically significant improvement in mobility and quality of life.  Baseline: FOTO: 40 Goal status: INITIAL   3.  Patient will increase Berg Balance score by > 6 points to demonstrate decreased fall risk during functional activities. Baseline: Not performed due to time constraints. Goal status: INITIAL   4.  Patient will reduce timed up and go to <11 seconds to reduce fall risk and demonstrate improved transfer/gait ability. Baseline: 11.39 sec Goal status: INITIAL  5.  Patient will increase 10 meter walk test to >1.60m/s as to improve gait speed for  better community ambulation and to reduce fall risk. Baseline: 18.59 sec - 0.54 m/s Goal status: INITIAL  6.  Patient will increase six minute walk test distance to >1000 for progression to community ambulator and improve gait ability Baseline: Not performed at this time and will perform at later date. Goal status: INITIAL     PLAN:  PT FREQUENCY: 2x/week  PT DURATION: 12 weeks  PLANNED INTERVENTIONS: Therapeutic exercises, Therapeutic activity, Neuromuscular re-education, Balance training, Gait training, Patient/Family education, Self Care, Joint mobilization, Stair training, DME instructions, Aquatic Therapy, Dry Needling, Electrical stimulation, Cryotherapy, Moist heat, scar mobilization, Ultrasound, Parrafin, Fluidotherapy, and Manual therapy  PLAN FOR NEXT SESSION:   Continue with weightbearing if tolerated. Continue with manual therapy for ROM and pain relief. Add progressive LE strengthening as appropriate.    Gwenlyn Saran, PT, DPT Physical Therapist - [facility]  04/07/22, 11:07 AM

## 2022-04-10 ENCOUNTER — Ambulatory Visit: Payer: Medicare Other

## 2022-04-10 DIAGNOSIS — R269 Unspecified abnormalities of gait and mobility: Secondary | ICD-10-CM

## 2022-04-10 DIAGNOSIS — M25672 Stiffness of left ankle, not elsewhere classified: Secondary | ICD-10-CM

## 2022-04-10 DIAGNOSIS — M25572 Pain in left ankle and joints of left foot: Secondary | ICD-10-CM | POA: Diagnosis not present

## 2022-04-10 DIAGNOSIS — R52 Pain, unspecified: Secondary | ICD-10-CM

## 2022-04-10 DIAGNOSIS — R262 Difficulty in walking, not elsewhere classified: Secondary | ICD-10-CM

## 2022-04-10 DIAGNOSIS — M6281 Muscle weakness (generalized): Secondary | ICD-10-CM

## 2022-04-10 NOTE — Therapy (Addendum)
OUTPATIENT PHYSICAL THERAPY LOWER EXTREMITY TREATMENT   Patient Name: Hannah Tucker MRN: [id number] DOB:Aug 19, 1940, 82 y.o., female Today's Date: 04/10/2022  END OF SESSION:   PT End of Session - 04/10/22 1653     Visit Number 20    Number of Visits 24    Date for PT Re-Evaluation 04/21/22    Authorization Type Medicare A&B Primary    Progress Note Due on Visit 20    PT Start Time 1436    PT Stop Time 1520    PT Time Calculation (min) 44 min    Equipment Utilized During Treatment Gait belt    Activity Tolerance Patient tolerated treatment well;No increased pain    Behavior During Therapy WFL for tasks assessed/performed                 Past Medical History:  Diagnosis Date   Arthritis    Complication of anesthesia    nausea   Dyspnea    with exertion   Family history of adverse reaction to anesthesia    PONV- MOM   Hypertension    Hypothyroidism    PONV (postoperative nausea and vomiting)    Sleep apnea    uses CPAP nightly   Stress incontinence    Valvular insufficiency, mitral    Past Surgical History:  Procedure Laterality Date   ANKLE ARTHROSCOPY WITH ARTHRODESIS Left 12/05/2021   Procedure: REPAIR OF TIBIAL NONUNION WITH AUTOGRAFT, TIBIOTALAR ARTHRODESIS;  Surgeon: Erle Crocker, MD;  Location: [place];  Service: Orthopedics;  Laterality: Left;   APPLICATION OF WOUND VAC Right 01/04/2021   Procedure: APPLICATION OF WOUND VAC;  Surgeon: Altamese [place], MD;  Location: [place];  Service: Orthopedics;  Laterality: Right;   BREAST CYST ASPIRATION Bilateral    neg   BREAST EXCISIONAL BIOPSY Right    1970's   CATARACT EXTRACTION W/ INTRAOCULAR LENS  IMPLANT, BILATERAL Bilateral    COLONOSCOPY     DILATION AND CURETTAGE OF UTERUS     HARDWARE REMOVAL Left 12/05/2021   Procedure: LEFT ANKLE DEEP HARDWARE REMOVAL, MEDIAL AND LATERAL;  Surgeon: Erle Crocker, MD;  Location: [place];  Service: Orthopedics;  Laterality: Left;  LENGTH OF SURGERY: 150  MINUTES   I & D EXTREMITY Left 01/04/2021   Procedure: IRRIGATION AND DEBRIDEMENT LEFT ANKLE;  Surgeon: Altamese [place], MD;  Location: [place];  Service: Orthopedics;  Laterality: Left;   I & D EXTREMITY Left 01/07/2021   Procedure: IRRIGATION AND DEBRIDEMENT EXTREMITY, Left ankle;  Surgeon: Altamese [place], MD;  Location: [place];  Service: Orthopedics;  Laterality: Left;   KNEE ARTHROPLASTY Left 11/24/2016   Procedure: COMPUTER ASSISTED TOTAL KNEE ARTHROPLASTY;  Surgeon: Dereck Leep, MD;  Location: [facility];  Service: Orthopedics;  Laterality: Left;   KNEE ARTHROPLASTY Right 12/31/2020   Procedure: COMPUTER ASSISTED TOTAL KNEE ARTHROPLASTY;  Surgeon: Dereck Leep, MD;  Location: [facility];  Service: Orthopedics;  Laterality: Right;   KNEE ARTHROSCOPY Left    KNEE ARTHROSCOPY Left 07/18/2015   Procedure: ARTHROSCOPY KNEE WITH MEDIAL COMPARTMENTAL MENICUS CHONDROPLASTY;  Surgeon: Dereck Leep, MD;  Location: [facility];  Service: Orthopedics;  Laterality: Left;   KNEE ARTHROSCOPY WITH LATERAL MENISECTOMY Left 07/18/2015   Procedure: LEFT KNEE ARTHROSCOPY WITH PARTIAL LATERAL MENISECTOMY GRADE 4 CHONDROMYLASIA LATERAL TIBIAL PLATEAU;  Surgeon: Dereck Leep, MD;  Location: [facility];  Service: Orthopedics;  Laterality: Left;   ORIF ANKLE FRACTURE Left 01/04/2021   Procedure: OPEN REDUCTION INTERNAL FIXATION (ORIF) ANKLE FRACTURE;  Surgeon: Altamese [place], MD;  Location: [place];  Service: Orthopedics;  Laterality: Left;   ORIF ANKLE FRACTURE Left 01/07/2021   Procedure: SCREW REMOVAL ANKLE REMOVAL OF WOUND VAC LEFT LEG;  Surgeon: Altamese [place], MD;  Location: [place];  Service: Orthopedics;  Laterality: Left;   Patient Active Problem List   Diagnosis Date Noted   Arthritis of left ankle 12/05/2021   Sleep disturbance    Acute blood loss anemia 01/18/2021   Hypokalemia 01/18/2021   Constipation 01/18/2021   Trauma 01/08/2021   Open left ankle fracture, sequela 01/03/2021   Essential hypertension  01/03/2021   Hypothyroidism 01/03/2021   Total knee replacement status 12/31/2020   Family history of heart disease 04/12/2018   History of total knee arthroplasty, left 11/24/2016   Adult idiopathic generalized osteoporosis 03/01/2015   OSA on CPAP 01/30/2014    PCP: Emily Filbert, MD  REFERRING PROVIDER: Erle Crocker, MD  REFERRING DIAG: Left tibiotalar arthrodesis with repair of tibial nonunion and hardware removal, 12/06/21  THERAPY DIAG:  Pain in left ankle and joints of left foot  Abnormality of gait and mobility  Stiffness of left ankle, not elsewhere classified  Muscle weakness (generalized)  Difficulty in walking, not elsewhere classified  Pain  Rationale for Evaluation and Treatment: Rehabilitation  ONSET DATE: 12/06/21   SUBJECTIVE:   SUBJECTIVE STATEMENT:  Pt reports that she had a good visit with her MD yesterday, noting that they were happy with the progress that she has made with therapy.  Pt states that there is one specific site of tenderness that they may eventually remove a screw from if it continues to bother her.     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 PMH includes HTN, valvular insufficiency, SUI.    PAIN:  Are you having pain? Yes: NPRS scale: 2/10 Pain location: more in the ankle Pain description: soreness Aggravating factors: Being on the knee scooter is more problematic on the surgically fixed knee.  PRECAUTIONS: Fall  WEIGHT BEARING RESTRICTIONS: Other: Now Weight bearing as tolerated - current using a walking boot  FALLS:  Has patient fallen in last 6 months? No  LIVING ENVIRONMENT: Lives with: lives with their spouse Lives in: House/apartment Stairs: Yes: Internal: 15 steps; on left going up and External: 4 steps; bilateral but cannot  reach both Has following equipment at home: Single point cane, Walker - 2 wheeled, Environmental consultant - 4 wheeled, Crutches, Electronics engineer, bed side commode, and Knee Scooter  OCCUPATION: Retired  PLOF: Independent  PATIENT GOALS: Pt wants to be able to go up/down the flight of steps.  NEXT MD VISIT:   OBJECTIVE:   DIAGNOSTIC FINDINGS:   CLINICAL DATA:  Fluoroscopic assistance for revision of internal fixation in left ankle   EXAM: LEFT ANKLE COMPLETE - 3+ VIEW   COMPARISON:  CT done on 09/24/2021   FINDINGS: There is interval placement of a new longer screw transfixing the medial malleolus. There is a new metallic plate along the anterior margin of distal tibia anchored to tibia and talus. Fluoroscopic time 1 minute and 11 seconds. Radiation dose 1.46 mGy.   IMPRESSION: Fluoroscopic assistance was provided for revision of internal fixation in left ankle.  PATIENT SURVEYS:  FOTO 40/50  COGNITION: Overall cognitive status: Within functional limits for tasks assessed     SENSATION: [facility]  PALPATION: No tenderness surrounding the ankle  LOWER EXTREMITY ROM:  Active ROM Right eval Left eval  Ankle dorsiflexion  2 deg  Ankle plantarflexion  13 deg  Ankle inversion  7 deg  Ankle eversion  3 deg   (Blank rows = not tested)   LOWER EXTREMITY SPECIAL TESTS:   Ankle special tests: Not performed at this time   FUNCTIONAL TESTS:   5 times sit to stand: 19.41 sec Timed up and go (TUG): 18.25 sec 10 meter walk test: 18.54 sec Dynamic Gait Index: Not performed due to weightbearing restrictions.   GAIT: Distance walked: Not performed due to weightbearing restrictions.    TODAY'S TREATMENT: DATE: 04/10/22   There.Ex:  Ambulation around the gym, down EVS hallway, past cafeteria, and back to gym with use of SPC and no ankle brace, x1 lap with no rest break, no increase in pain noted.   Manual:  STM applied to LE with focus placed on anterior tibialis,  gastroc/soleus complex, and plantar fascia.  Pt with noticeable TP's noted in all musculature and in the plantar fascia Long sitting distal tibifibular AP mobilizations, Grades II-III, for pain modulation and improved ankle ROM Long sitting metatarsal fan mobilization, Grades III, to improve ROM of the metatarsals Long sitting subtalar medial/lateral glides, Grades II-III, for pain modulation and improved ankle ROM  Discussed potential dry needling of the calves if pt continues to have pain persistent in the heel, likely due to inability to stretch the calves effectively.    PATIENT EDUCATION:  Education details: Pt educated on role of PT and services provided during current POC, along with prognosis and information about the clinic.  Person educated: Patient and Spouse Education method: Explanation Education comprehension: verbalized understanding  HOME EXERCISE PROGRAM:  Access Code: [email or web address] URL: [email or web address] Date: 01/27/2022 Prepared by: Lantana  - 1 x daily - 7 x weekly - 3 sets - 10 reps - Seated Heel Raise  - 1 x daily - 7 x weekly - 3 sets - 10 reps - Seated Long Arc Quad  - 1 x daily - 7 x weekly - 3 sets - 10 reps - Ankle Inversion Eversion Towel Slide  - 1 x daily - 7 x weekly - 3 sets - 10 reps - Arch Lifting  - 1 x daily - 7 x weekly - 3 sets - 10 reps   ASSESSMENT:  CLINICAL IMPRESSION:  Pt is moving well and has good strength in the ROM that she is able to achieve.  Pt responded well to the manual therapy approach and was able to increase her ROM following the treatment session as well.  Pt advised to continue with her HEP and to ambulate with the use of the cane going forward and wean from the brace.  Pt agreeable to this change of plan and discussed with therapist on potential d/c options in the coming weeks.   Pt will continue to benefit from skilled therapy to address remaining deficits in order to improve  overall QoL and return to PLOF.    Will perform goal assessment at next visit as part of progress note.  Pt focus placed on performing TP release to the region as pt still having some discomfort in the anterior tib region.      OBJECTIVE IMPAIRMENTS: Abnormal gait, decreased balance, decreased mobility, difficulty walking, decreased ROM, decreased strength, hypomobility, impaired flexibility, and pain.   ACTIVITY LIMITATIONS: standing, squatting, stairs, transfers, bed mobility, bathing, dressing, and  locomotion level  PARTICIPATION LIMITATIONS: meal prep, cleaning, laundry, driving, shopping, community activity, yard work, and church  PERSONAL FACTORS: Age, Past/current experiences, Time since onset of injury/illness/exacerbation, and 3+ comorbidities: arthritis, HTN, generalized osteoporosis  are also affecting patient's functional outcome.   REHAB POTENTIAL: Good  CLINICAL DECISION MAKING: Stable/uncomplicated  EVALUATION COMPLEXITY: Moderate   GOALS: Goals reviewed with patient? Yes  SHORT TERM GOALS: Target date: 02/24/2022    Pt will be independent with HEP in order to demonstrate increased ability to perform tasks related to occupation/hobbies. Baseline: Pt given HEP at initial evaluation; 03/05/2022- Patient verbalized compliance with HEP and able to return demonstration of ankle ROM and LE strenghthening. Review general LE strengthening with patient and husband. Goal status: Progressing   LONG TERM GOALS: Target date: 04/21/2022  1.  Patient (> 32 years old) will complete five times sit to stand test in < 15 seconds indicating an increased LE strength and improved balance. Baseline: Not performed due to weightbearing restrictions. 03/05/2022- Deferred - waiting to discuss any restrictions with MD Goal status: INITIAL  2.  Patient will increase FOTO score to equal to or greater than  50   to demonstrate statistically significant improvement in mobility and quality of life.   Baseline: FOTO: 40 Goal status: INITIAL   3.  Patient will increase Berg Balance score by > 6 points to demonstrate decreased fall risk during functional activities. Baseline: Not performed due to time constraints. Goal status: INITIAL   4.  Patient will reduce timed up and go to <11 seconds to reduce fall risk and demonstrate improved transfer/gait ability. Baseline: 11.39 sec with walker Goal status: INITIAL  5.  Patient will increase 10 meter walk test to >1.75ms as to improve gait speed for better community ambulation and to reduce fall risk. Baseline: 18.59 sec - 0.54 m/s with walker Goal status: INITIAL  6.  Patient will increase six minute walk test distance to >1000 for progression to community ambulator and improve gait ability Baseline: Not performed at this time and will perform at later date. Goal status: INITIAL     PLAN:  PT FREQUENCY: 2x/week  PT DURATION: 12 weeks  PLANNED INTERVENTIONS: Therapeutic exercises, Therapeutic activity, Neuromuscular re-education, Balance training, Gait training, Patient/Family education, Self Care, Joint mobilization, Stair training, DME instructions, Aquatic Therapy, Dry Needling, Electrical stimulation, Cryotherapy, Moist heat, scar mobilization, Ultrasound, Parrafin, Fluidotherapy, and Manual therapy  PLAN FOR NEXT SESSION:   Assess goals as part of progress note. Continue with weightbearing if tolerated. Continue with manual therapy for ROM and pain relief. Add progressive LE strengthening as appropriate.    JGwenlyn Saran PT, DPT Physical Therapist - [facility] 04/10/22, 4:54 PM

## 2022-04-14 ENCOUNTER — Ambulatory Visit: Payer: Medicare Other | Admitting: Physical Therapy

## 2022-04-14 DIAGNOSIS — M25572 Pain in left ankle and joints of left foot: Secondary | ICD-10-CM

## 2022-04-14 DIAGNOSIS — R269 Unspecified abnormalities of gait and mobility: Secondary | ICD-10-CM

## 2022-04-14 DIAGNOSIS — R262 Difficulty in walking, not elsewhere classified: Secondary | ICD-10-CM

## 2022-04-14 DIAGNOSIS — M6281 Muscle weakness (generalized): Secondary | ICD-10-CM

## 2022-04-14 DIAGNOSIS — R52 Pain, unspecified: Secondary | ICD-10-CM

## 2022-04-14 DIAGNOSIS — M25672 Stiffness of left ankle, not elsewhere classified: Secondary | ICD-10-CM

## 2022-04-14 NOTE — Therapy (Signed)
OUTPATIENT PHYSICAL THERAPY LOWER EXTREMITY TREATMENT   Patient Name: Hannah Tucker MRN: [id number] DOB:Sep 21, 1940, 82 y.o., female Today's Date: 04/14/2022  END OF SESSION:   PT End of Session - 04/14/22 1413     Visit Number 21    Number of Visits 24    Date for PT Re-Evaluation 04/21/22    Authorization Type Medicare A&B Primary    Progress Note Due on Visit 29    PT Start Time 0104    PT Stop Time 0147    PT Time Calculation (min) 43 min    Equipment Utilized During Treatment Gait belt    Activity Tolerance Patient tolerated treatment well;No increased pain    Behavior During Therapy WFL for tasks assessed/performed                  Past Medical History:  Diagnosis Date   Arthritis    Complication of anesthesia    nausea   Dyspnea    with exertion   Family history of adverse reaction to anesthesia    PONV- MOM   Hypertension    Hypothyroidism    PONV (postoperative nausea and vomiting)    Sleep apnea    uses CPAP nightly   Stress incontinence    Valvular insufficiency, mitral    Past Surgical History:  Procedure Laterality Date   ANKLE ARTHROSCOPY WITH ARTHRODESIS Left 12/05/2021   Procedure: REPAIR OF TIBIAL NONUNION WITH AUTOGRAFT, TIBIOTALAR ARTHRODESIS;  Surgeon: Erle Crocker, MD;  Location: [place];  Service: Orthopedics;  Laterality: Left;   APPLICATION OF WOUND VAC Right 01/04/2021   Procedure: APPLICATION OF WOUND VAC;  Surgeon: Altamese Manchester, MD;  Location: [place];  Service: Orthopedics;  Laterality: Right;   BREAST CYST ASPIRATION Bilateral    neg   BREAST EXCISIONAL BIOPSY Right    1970's   CATARACT EXTRACTION W/ INTRAOCULAR LENS  IMPLANT, BILATERAL Bilateral    COLONOSCOPY     DILATION AND CURETTAGE OF UTERUS     HARDWARE REMOVAL Left 12/05/2021   Procedure: LEFT ANKLE DEEP HARDWARE REMOVAL, MEDIAL AND LATERAL;  Surgeon: Erle Crocker, MD;  Location: [place];  Service: Orthopedics;  Laterality: Left;  LENGTH OF SURGERY: 150  MINUTES   I & D EXTREMITY Left 01/04/2021   Procedure: IRRIGATION AND DEBRIDEMENT LEFT ANKLE;  Surgeon: Altamese Delta, MD;  Location: [place];  Service: Orthopedics;  Laterality: Left;   I & D EXTREMITY Left 01/07/2021   Procedure: IRRIGATION AND DEBRIDEMENT EXTREMITY, Left ankle;  Surgeon: Altamese Long Barn, MD;  Location: [place];  Service: Orthopedics;  Laterality: Left;   KNEE ARTHROPLASTY Left 11/24/2016   Procedure: COMPUTER ASSISTED TOTAL KNEE ARTHROPLASTY;  Surgeon: Dereck Leep, MD;  Location: [facility];  Service: Orthopedics;  Laterality: Left;   KNEE ARTHROPLASTY Right 12/31/2020   Procedure: COMPUTER ASSISTED TOTAL KNEE ARTHROPLASTY;  Surgeon: Dereck Leep, MD;  Location: [facility];  Service: Orthopedics;  Laterality: Right;   KNEE ARTHROSCOPY Left    KNEE ARTHROSCOPY Left 07/18/2015   Procedure: ARTHROSCOPY KNEE WITH MEDIAL COMPARTMENTAL MENICUS CHONDROPLASTY;  Surgeon: Dereck Leep, MD;  Location: [facility];  Service: Orthopedics;  Laterality: Left;   KNEE ARTHROSCOPY WITH LATERAL MENISECTOMY Left 07/18/2015   Procedure: LEFT KNEE ARTHROSCOPY WITH PARTIAL LATERAL MENISECTOMY GRADE 4 CHONDROMYLASIA LATERAL TIBIAL PLATEAU;  Surgeon: Dereck Leep, MD;  Location: [facility];  Service: Orthopedics;  Laterality: Left;   ORIF ANKLE FRACTURE Left 01/04/2021   Procedure: OPEN REDUCTION INTERNAL FIXATION (ORIF) ANKLE  FRACTURE;  Surgeon: Altamese Delta, MD;  Location: [place];  Service: Orthopedics;  Laterality: Left;   ORIF ANKLE FRACTURE Left 01/07/2021   Procedure: SCREW REMOVAL ANKLE REMOVAL OF WOUND VAC LEFT LEG;  Surgeon: Altamese Miner, MD;  Location: [place];  Service: Orthopedics;  Laterality: Left;   Patient Active Problem List   Diagnosis Date Noted   Arthritis of left ankle 12/05/2021   Sleep disturbance    Acute blood loss anemia 01/18/2021   Hypokalemia 01/18/2021   Constipation 01/18/2021   Trauma 01/08/2021   Open left ankle fracture, sequela 01/03/2021   Essential hypertension  01/03/2021   Hypothyroidism 01/03/2021   Total knee replacement status 12/31/2020   Family history of heart disease 04/12/2018   History of total knee arthroplasty, left 11/24/2016   Adult idiopathic generalized osteoporosis 03/01/2015   OSA on CPAP 01/30/2014    PCP: Emily Filbert, MD  REFERRING PROVIDER: Erle Crocker, MD  REFERRING DIAG: Left tibiotalar arthrodesis with repair of tibial nonunion and hardware removal, 12/06/21  THERAPY DIAG:  No diagnosis found.  Rationale for Evaluation and Treatment: Rehabilitation  ONSET DATE: 12/06/21   SUBJECTIVE:   SUBJECTIVE STATEMENT:  Pt reports she is dong well and did not wear brace.this date. She is sad she missed her last appointment.      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 PMH includes HTN, valvular insufficiency, SUI.    PAIN:  Are you having pain? Yes: NPRS scale: 2/10 Pain location: more in the ankle Pain description: soreness Aggravating factors: Being on the knee scooter is more problematic on the surgically fixed knee.  PRECAUTIONS: Fall  WEIGHT BEARING RESTRICTIONS: Other: Now Weight bearing as tolerated - current using a walking boot  FALLS:  Has patient fallen in last 6 months? No  LIVING ENVIRONMENT: Lives with: lives with their spouse Lives in: House/apartment Stairs: Yes: Internal: 15 steps; on left going up and External: 4 steps; bilateral but cannot reach both Has following equipment at home: Single point cane, Walker - 2 wheeled, Environmental consultant - 4 wheeled, Crutches, Electronics engineer, bed side commode, and Knee Scooter  OCCUPATION: Retired  PLOF: Independent  PATIENT GOALS: Pt wants to be able to go up/down the flight of steps.  NEXT MD VISIT:   OBJECTIVE:   DIAGNOSTIC FINDINGS:   CLINICAL DATA:  Fluoroscopic  assistance for revision of internal fixation in left ankle   EXAM: LEFT ANKLE COMPLETE - 3+ VIEW   COMPARISON:  CT done on 09/24/2021   FINDINGS: There is interval placement of a new longer screw transfixing the medial malleolus. There is a new metallic plate along the anterior margin of distal tibia anchored to tibia and talus. Fluoroscopic time 1 minute and 11 seconds. Radiation dose 1.46 mGy.   IMPRESSION: Fluoroscopic assistance was provided for revision of internal fixation in left ankle.  PATIENT SURVEYS:  FOTO 40/50  COGNITION: Overall cognitive status: Within functional limits for tasks assessed     SENSATION: WFL  PALPATION: No tenderness surrounding the ankle  LOWER EXTREMITY ROM:  Active ROM Right eval Left eval  Ankle dorsiflexion  2 deg  Ankle plantarflexion  13 deg  Ankle inversion  7 deg  Ankle eversion  3 deg   (Blank rows = not tested)   LOWER EXTREMITY SPECIAL TESTS:   Ankle special tests: Not  performed at this time   FUNCTIONAL TESTS:   5 times sit to stand: 19.41 sec Timed up and go (TUG): 18.25 sec 10 meter walk test: 18.54 sec Dynamic Gait Index: Not performed due to weightbearing restrictions.   GAIT: Distance walked: Not performed due to weightbearing restrictions.    TODAY'S TREATMENT: DATE: 04/14/22   Physical therapy treatment session today consisted of completing assessment of goals and administration of testing as demonstrated and documented in flow sheet, treatment, and goals section of this note. Addition treatments may be found below.    [facility] PT Assessment - 04/14/22 0001       Standardized Balance Assessment   Standardized Balance Assessment Berg Balance Test      Berg Balance Test   Sit to Stand Able to stand without using hands and stabilize independently    Standing Unsupported Able to stand safely 2 minutes    Sitting with Back Unsupported but Feet Supported on Floor or Stool Able to sit safely and  securely 2 minutes    Stand to Sit Sits safely with minimal use of hands    Transfers Able to transfer safely, minor use of hands    Standing Unsupported with Eyes Closed Able to stand 10 seconds safely    Standing Unsupported with Feet Together Able to place feet together independently and stand 1 minute safely    From Standing, Reach Forward with Outstretched Arm Can reach confidently >25 cm (10")    From Standing Position, Pick up Object from Floor Able to pick up shoe safely and easily    From Standing Position, Turn to Look Behind Over each Shoulder Looks behind from both sides and weight shifts well    Turn 360 Degrees Able to turn 360 degrees safely but slowly    Standing Unsupported, Alternately Place Feet on Step/Stool Able to stand independently and complete 8 steps >20 seconds    Standing Unsupported, One Foot in Front Able to take small step independently and hold 30 seconds    Standing on One Leg Tries to lift leg/unable to hold 3 seconds but remains standing independently    Total Score 48              PATIENT EDUCATION:  Education details: Pt educated on role of PT and services provided during current POC, along with prognosis and information about the clinic.  Person educated: Patient and Spouse Education method: Explanation Education comprehension: verbalized understanding  HOME EXERCISE PROGRAM:  Access Code: [email or web address] URL: [email or web address] Date: 01/27/2022 Prepared by: [place]  - 1 x daily - 7 x weekly - 3 sets - 10 reps - Seated Heel Raise  - 1 x daily - 7 x weekly - 3 sets - 10 reps - Seated Long Arc Quad  - 1 x daily - 7 x weekly - 3 sets - 10 reps - Ankle Inversion Eversion Towel Slide  - 1 x daily - 7 x weekly - 3 sets - 10 reps - Arch Lifting  - 1 x daily - 7 x weekly - 3 sets - 10 reps   ASSESSMENT:  CLINICAL IMPRESSION:  Pt presents for goal assessment this date. Pt shows great progress with  goals. Pt able to complete all walking goals with SPC and without ankle brace indicating improved independence from assistive devices and braces with her mobility. Pt time and distance with ambulatory tasks did regress but this was secondary to not using any brace as well as  utilizing [place] compared to walker. Patient's condition has the potential to improve in response to therapy. Maximum improvement is yet to be obtained. The anticipated improvement is attainable and reasonable in a generally predictable time.  Pt will continue to benefit from skilled physical therapy intervention to address impairments, improve QOL, and attain therapy goals.        OBJECTIVE IMPAIRMENTS: Abnormal gait, decreased balance, decreased mobility, difficulty walking, decreased ROM, decreased strength, hypomobility, impaired flexibility, and pain.   ACTIVITY LIMITATIONS: standing, squatting, stairs, transfers, bed mobility, bathing, dressing, and locomotion level  PARTICIPATION LIMITATIONS: meal prep, cleaning, laundry, driving, shopping, community activity, yard work, and church  PERSONAL FACTORS: Age, Past/current experiences, Time since onset of injury/illness/exacerbation, and 3+ comorbidities: arthritis, HTN, generalized osteoporosis  are also affecting patient's functional outcome.   REHAB POTENTIAL: Good  CLINICAL DECISION MAKING: Stable/uncomplicated  EVALUATION COMPLEXITY: Moderate   GOALS: Goals reviewed with patient? Yes  SHORT TERM GOALS: Target date: 02/24/2022    Pt will be independent with HEP in order to demonstrate increased ability to perform tasks related to occupation/hobbies. Baseline: Pt given HEP at initial evaluation; 03/05/2022- Patient verbalized compliance with HEP and able to return demonstration of ankle ROM and LE strenghthening. Review general LE strengthening with patient and husband. Goal status: Progressing   LONG TERM GOALS: Target date: 04/21/2022  1.  Patient (> 55 years  old) will complete five times sit to stand test in < 15 seconds indicating an increased LE strength and improved balance. Baseline: Not performed due to weightbearing restrictions. 03/05/2022- Deferred - waiting to discuss any restrictions with MD 2/12: 16.2 sec Goal status: Ongoing   2.  Patient will increase FOTO score to equal to or greater than  50   to demonstrate statistically significant improvement in mobility and quality of life.  Baseline: FOTO: 40 2/12: 47 Goal status: Ongoing    3.  Patient will increase Berg Balance score by > 6 points to demonstrate decreased fall risk during functional activities. Baseline: Not performed due to time constraints. 2/12: 48/56 Goal status: Ongoing    4.  Patient will reduce timed up and go to <11 seconds to reduce fall risk and demonstrate improved transfer/gait ability. Baseline: 11.39 sec with walker 2/12:16.1 sec with SPC  Goal status: INITIAL  5.  Patient will increase 10 meter walk test to >1.29ms as to improve gait speed for better community ambulation and to reduce fall risk. Baseline: 18.59 sec - 0.54 m/s with walker 2/12: .52 m/s with SPC  Goal status: INITIAL  6.  Patient will increase six minute walk test distance to >1000 for progression to community ambulator and improve gait ability Baseline: Not performed at this time and will perform at later date. 2/12: 500 feet with no brace and with SPC some soreness in the  ankle Goal status: INITIAL      PLAN:  PT FREQUENCY: 2x/week  PT DURATION: 12 weeks  PLANNED INTERVENTIONS: Therapeutic exercises, Therapeutic activity, Neuromuscular re-education, Balance training, Gait training, Patient/Family education, Self Care, Joint mobilization, Stair training, DME instructions, Aquatic Therapy, Dry Needling, Electrical stimulation, Cryotherapy, Moist heat, scar mobilization, Ultrasound, Parrafin, Fluidotherapy, and Manual therapy  PLAN FOR NEXT SESSION:   Assess goals as part of progress  note. Continue with weightbearing if tolerated. Continue with manual therapy for ROM and pain relief. Add progressive LE strengthening as appropriate.    JGwenlyn Saran PT, DPT Physical Therapist - [facility] 04/14/22, 2:14 PM

## 2022-04-17 ENCOUNTER — Ambulatory Visit: Payer: Medicare Other

## 2022-04-17 DIAGNOSIS — M6281 Muscle weakness (generalized): Secondary | ICD-10-CM

## 2022-04-17 DIAGNOSIS — R52 Pain, unspecified: Secondary | ICD-10-CM

## 2022-04-17 DIAGNOSIS — M25572 Pain in left ankle and joints of left foot: Secondary | ICD-10-CM | POA: Diagnosis not present

## 2022-04-17 DIAGNOSIS — M25672 Stiffness of left ankle, not elsewhere classified: Secondary | ICD-10-CM

## 2022-04-17 DIAGNOSIS — R269 Unspecified abnormalities of gait and mobility: Secondary | ICD-10-CM

## 2022-04-17 DIAGNOSIS — R262 Difficulty in walking, not elsewhere classified: Secondary | ICD-10-CM

## 2022-04-17 NOTE — Therapy (Signed)
OUTPATIENT PHYSICAL THERAPY LOWER EXTREMITY TREATMENT   Patient Name: Hannah Tucker MRN: [id number] DOB:1940-10-13, 82 y.o., female Today's Date: 04/17/2022  END OF SESSION:   PT End of Session - 04/17/22 1520     Visit Number 22    Number of Visits 24    Date for PT Re-Evaluation 04/21/22    Authorization Type Medicare A&B Primary    Progress Note Due on Visit 20    PT Start Time 1520    PT Stop Time 1600    PT Time Calculation (min) 40 min    Equipment Utilized During Treatment Gait belt    Activity Tolerance Patient tolerated treatment well;No increased pain    Behavior During Therapy WFL for tasks assessed/performed                  Past Medical History:  Diagnosis Date   Arthritis    Complication of anesthesia    nausea   Dyspnea    with exertion   Family history of adverse reaction to anesthesia    PONV- MOM   Hypertension    Hypothyroidism    PONV (postoperative nausea and vomiting)    Sleep apnea    uses CPAP nightly   Stress incontinence    Valvular insufficiency, mitral    Past Surgical History:  Procedure Laterality Date   ANKLE ARTHROSCOPY WITH ARTHRODESIS Left 12/05/2021   Procedure: REPAIR OF TIBIAL NONUNION WITH AUTOGRAFT, TIBIOTALAR ARTHRODESIS;  Surgeon: Erle Crocker, MD;  Location: [place] [facility];  Service: Orthopedics;  Laterality: Left;   APPLICATION OF WOUND VAC Right 01/04/2021   Procedure: APPLICATION OF WOUND VAC;  Surgeon: Altamese [place], MD;  Location: [place];  Service: Orthopedics;  Laterality: Right;   BREAST CYST ASPIRATION Bilateral    neg   BREAST EXCISIONAL BIOPSY Right    1970's   CATARACT EXTRACTION W/ INTRAOCULAR LENS  IMPLANT, BILATERAL Bilateral    COLONOSCOPY     DILATION AND CURETTAGE OF UTERUS     HARDWARE REMOVAL Left 12/05/2021   Procedure: LEFT ANKLE DEEP HARDWARE REMOVAL, MEDIAL AND LATERAL;  Surgeon: Erle Crocker, MD;  Location: [place];  Service: Orthopedics;  Laterality: Left;  LENGTH OF SURGERY: 150  MINUTES   I & D EXTREMITY Left 01/04/2021   Procedure: IRRIGATION AND DEBRIDEMENT LEFT ANKLE;  Surgeon: Altamese [place], MD;  Location: [place];  Service: Orthopedics;  Laterality: Left;   I & D EXTREMITY Left 01/07/2021   Procedure: IRRIGATION AND DEBRIDEMENT EXTREMITY, Left ankle;  Surgeon: Altamese [place]row, MD;  Location: [place];  Service: Orthopedics;  Laterality: Left;   KNEE ARTHROPLASTY Left 11/24/2016   Procedure: COMPUTER ASSISTED TOTAL KNEE ARTHROPLASTY;  Surgeon: Dereck Leep, MD;  Location: [facility];  Service: Orthopedics;  Laterality: Left;   KNEE ARTHROPLASTY Right 12/31/2020   Procedure: COMPUTER ASSISTED TOTAL KNEE ARTHROPLASTY;  Surgeon: Dereck Leep, MD;  Location: [facility];  Service: Orthopedics;  Laterality: Right;   KNEE ARTHROSCOPY Left    KNEE ARTHROSCOPY Left 07/18/2015   Procedure: ARTHROSCOPY KNEE WITH MEDIAL COMPARTMENTAL MENICUS CHONDROPLASTY;  Surgeon: Dereck Leep, MD;  Location: [facility];  Service: Orthopedics;  Laterality: Left;   KNEE ARTHROSCOPY WITH LATERAL MENISECTOMY Left 07/18/2015   Procedure: LEFT KNEE ARTHROSCOPY WITH PARTIAL LATERAL MENISECTOMY GRADE 4 CHONDROMYLASIA LATERAL TIBIAL PLATEAU;  Surgeon: Dereck Leep, MD;  Location: [facility];  Service: Orthopedics;  Laterality: Left;   ORIF ANKLE FRACTURE Left 01/04/2021   Procedure: OPEN REDUCTION INTERNAL FIXATION (ORIF) ANKLE  FRACTURE;  Surgeon: Altamese [place], MD;  Location: [place];  Service: Orthopedics;  Laterality: Left;   ORIF ANKLE FRACTURE Left 01/07/2021   Procedure: SCREW REMOVAL ANKLE REMOVAL OF WOUND VAC LEFT LEG;  Surgeon: Altamese [place], MD;  Location: [place];  Service: Orthopedics;  Laterality: Left;   Patient Active Problem List   Diagnosis Date Noted   Arthritis of left ankle 12/05/2021   Sleep disturbance    Acute blood loss anemia 01/18/2021   Hypokalemia 01/18/2021   Constipation 01/18/2021   Trauma 01/08/2021   Open left ankle fracture, sequela 01/03/2021   Essential hypertension  01/03/2021   Hypothyroidism 01/03/2021   Total knee replacement status 12/31/2020   Family history of heart disease 04/12/2018   History of total knee arthroplasty, left 11/24/2016   Adult idiopathic generalized osteoporosis 03/01/2015   OSA on CPAP 01/30/2014    PCP: Emily Filbert, MD  REFERRING PROVIDER: Erle Crocker, MD  REFERRING DIAG: Left tibiotalar arthrodesis with repair of tibial nonunion and hardware removal, 12/06/21  THERAPY DIAG:  Pain in left ankle and joints of left foot  Abnormality of gait and mobility  Stiffness of left ankle, not elsewhere classified  Muscle weakness (generalized)  Difficulty in walking, not elsewhere classified  Pain  Rationale for Evaluation and Treatment: Rehabilitation  ONSET DATE: 12/06/21   SUBJECTIVE:   SUBJECTIVE STATEMENT:  Pt reports she was able to walk around her house this morning around 10 laps with the use of her personal cane.  The new cane that she ordered is coming on Saturday with the three prongs at the base.    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 PMH includes HTN, valvular insufficiency, SUI.    PAIN:  Are you having pain? Yes: NPRS scale: 2/10 Pain location: more in the ankle Pain description: soreness Aggravating factors: Being on the knee scooter is more problematic on the surgically fixed knee.  PRECAUTIONS: Fall  WEIGHT BEARING RESTRICTIONS: Other: Now Weight bearing as tolerated - current using a walking boot  FALLS:  Has patient fallen in last 6 months? No  LIVING ENVIRONMENT: Lives with: lives with their spouse Lives in: House/apartment Stairs: Yes: Internal: 15 steps; on left going up and External: 4 steps; bilateral but cannot reach both Has following equipment at home: Single point cane, Walker - 2  wheeled, Environmental consultant - 4 wheeled, Crutches, Electronics engineer, bed side commode, and Knee Scooter  OCCUPATION: Retired  PLOF: Independent  PATIENT GOALS: Pt wants to be able to go up/down the flight of steps.  NEXT MD VISIT:   OBJECTIVE:   DIAGNOSTIC FINDINGS:   CLINICAL DATA:  Fluoroscopic assistance for revision of internal fixation in left ankle   EXAM: LEFT ANKLE COMPLETE - 3+ VIEW   COMPARISON:  CT done on 09/24/2021   FINDINGS: There is interval placement of a new longer screw transfixing the medial malleolus. There is a new metallic plate along the anterior margin of distal tibia anchored to tibia and talus. Fluoroscopic time 1 minute and 11 seconds. Radiation dose 1.46 mGy.   IMPRESSION: Fluoroscopic assistance was provided for revision of internal fixation in left ankle.  PATIENT SURVEYS:  FOTO 40/50  COGNITION: Overall cognitive status: Within functional limits for tasks assessed     SENSATION: WFL  PALPATION: No tenderness surrounding the ankle  LOWER EXTREMITY  ROM:  Active ROM Right eval Left eval  Ankle dorsiflexion  2 deg  Ankle plantarflexion  13 deg  Ankle inversion  7 deg  Ankle eversion  3 deg   (Blank rows = not tested)   LOWER EXTREMITY SPECIAL TESTS:   Ankle special tests: Not performed at this time   FUNCTIONAL TESTS:   5 times sit to stand: 19.41 sec Timed up and go (TUG): 18.25 sec 10 meter walk test: 18.54 sec Dynamic Gait Index: Not performed due to weightbearing restrictions.   GAIT: Distance walked: Not performed due to weightbearing restrictions.    TODAY'S TREATMENT: DATE: 04/17/22   There.Ex:   Forward lunge at steps with leading foot on 2nd step, for increased ROM of the ankle, 30 sec bouts x2 each LE leading Static stance with leading foot placed on incline board and trailing foot placed on airex pad, 30 sec bouts x2 each LE leading Static stance on incline board at lowest setting, 30 sec bouts x2 Heel raises on  incline board, x10   Gait:  Ambulation around the gym, down EVS hallway, past cafeteria, and back to gym with use of SPC and no ankle brace, x1 lap with no rest break, no increase in pain noted.  Ambulation around the gym 148' with no AD or ankle brace, x1 lap with no rest break and no increase in pain noted.   PATIENT EDUCATION:  Education details: Pt educated on role of PT and services provided during current POC, along with prognosis and information about the clinic.  Person educated: Patient and Spouse Education method: Explanation Education comprehension: verbalized understanding  HOME EXERCISE PROGRAM:  Access Code: [email or web address] URL: [email or web address] Date: 01/27/2022 Prepared by: [place]  - 1 x daily - 7 x weekly - 3 sets - 10 reps - Seated Heel Raise  - 1 x daily - 7 x weekly - 3 sets - 10 reps - Seated Long Arc Quad  - 1 x daily - 7 x weekly - 3 sets - 10 reps - Ankle Inversion Eversion Towel Slide  - 1 x daily - 7 x weekly - 3 sets - 10 reps - Arch Lifting  - 1 x daily - 7 x weekly - 3 sets - 10 reps   ASSESSMENT:  CLINICAL IMPRESSION:  Pt is making significant progress towards goals and is able to ambulate today without the use of the walker or cane.  Pt encouraged to continue to ambulate around the home without the use of the cane and when going for longer distances such as shopping at [employer], utilize the cane for support.  Pt will continue to benefit from ankle strengthening exercises as pt still displays weakness when the L LE is placed on an unstable surface (airex pad).   Pt will continue to benefit from skilled therapy to address remaining deficits in order to improve overall QoL and return to PLOF.         OBJECTIVE IMPAIRMENTS: Abnormal gait, decreased balance, decreased mobility, difficulty walking, decreased ROM, decreased strength, hypomobility, impaired flexibility, and pain.   ACTIVITY LIMITATIONS:  standing, squatting, stairs, transfers, bed mobility, bathing, dressing, and locomotion level  PARTICIPATION LIMITATIONS: meal prep, cleaning, laundry, driving, shopping, community activity, yard work, and church  PERSONAL FACTORS: Age, Past/current experiences, Time since onset of injury/illness/exacerbation, and 3+ comorbidities: arthritis, HTN, generalized osteoporosis  are also affecting patient's functional outcome.   REHAB POTENTIAL: Good  CLINICAL DECISION MAKING: Stable/uncomplicated  EVALUATION COMPLEXITY: Moderate  GOALS: Goals reviewed with patient? Yes  SHORT TERM GOALS: Target date: 02/24/2022    Pt will be independent with HEP in order to demonstrate increased ability to perform tasks related to occupation/hobbies. Baseline: Pt given HEP at initial evaluation; 03/05/2022- Patient verbalized compliance with HEP and able to return demonstration of ankle ROM and LE strenghthening. Review general LE strengthening with patient and husband. Goal status: Progressing   LONG TERM GOALS: Target date: 04/21/2022  1.  Patient (> 12 years old) will complete five times sit to stand test in < 15 seconds indicating an increased LE strength and improved balance. Baseline: Not performed due to weightbearing restrictions. 03/05/2022- Deferred - waiting to discuss any restrictions with MD 2/12: 16.2 sec Goal status: Ongoing   2.  Patient will increase FOTO score to equal to or greater than  50   to demonstrate statistically significant improvement in mobility and quality of life.  Baseline: FOTO: 40 2/12: 47 Goal status: Ongoing    3.  Patient will increase Berg Balance score by > 6 points to demonstrate decreased fall risk during functional activities. Baseline: Not performed due to time constraints. 2/12: 48/56 Goal status: Ongoing    4.  Patient will reduce timed up and go to <11 seconds to reduce fall risk and demonstrate improved transfer/gait ability. Baseline: 11.39 sec with  walker 2/12:16.1 sec with SPC  Goal status: INITIAL  5.  Patient will increase 10 meter walk test to >1.24ms as to improve gait speed for better community ambulation and to reduce fall risk. Baseline: 18.59 sec - 0.54 m/s with walker 2/12: .52 m/s with SPC  Goal status: INITIAL  6.  Patient will increase six minute walk test distance to >1000 for progression to community ambulator and improve gait ability Baseline: Not performed at this time and will perform at later date. 2/12: 500 feet with no brace and with SPC some soreness in the  ankle Goal status: INITIAL      PLAN:  PT FREQUENCY: 2x/week  PT DURATION: 12 weeks  PLANNED INTERVENTIONS: Therapeutic exercises, Therapeutic activity, Neuromuscular re-education, Balance training, Gait training, Patient/Family education, Self Care, Joint mobilization, Stair training, DME instructions, Aquatic Therapy, Dry Needling, Electrical stimulation, Cryotherapy, Moist heat, scar mobilization, Ultrasound, Parrafin, Fluidotherapy, and Manual therapy  PLAN FOR NEXT SESSION:   Assess goals as part of progress note. Continue with weightbearing if tolerated. Continue with manual therapy for ROM and pain relief. Add progressive LE strengthening as appropriate.    JGwenlyn Saran PT, DPT Physical Therapist - [facility] 04/17/22, 5:44 PM

## 2022-04-21 ENCOUNTER — Ambulatory Visit: Payer: Medicare Other

## 2022-04-21 DIAGNOSIS — R52 Pain, unspecified: Secondary | ICD-10-CM

## 2022-04-21 DIAGNOSIS — R262 Difficulty in walking, not elsewhere classified: Secondary | ICD-10-CM

## 2022-04-21 DIAGNOSIS — M6281 Muscle weakness (generalized): Secondary | ICD-10-CM

## 2022-04-21 DIAGNOSIS — M25572 Pain in left ankle and joints of left foot: Secondary | ICD-10-CM

## 2022-04-21 DIAGNOSIS — M25672 Stiffness of left ankle, not elsewhere classified: Secondary | ICD-10-CM

## 2022-04-21 DIAGNOSIS — R269 Unspecified abnormalities of gait and mobility: Secondary | ICD-10-CM

## 2022-04-21 NOTE — Therapy (Signed)
OUTPATIENT PHYSICAL THERAPY LOWER EXTREMITY TREATMENT/RECERTIFICATION   Patient Name: Hannah Tucker MRN: [id number] DOB:August 29, 1940, 82 y.o., female Today's Date: 04/22/2022  END OF SESSION:   PT End of Session - 04/21/22 1106     Visit Number 23    Number of Visits 84    Date for PT Re-Evaluation 07/14/22    Authorization Type Medicare A&B Primary    Progress Note Due on Visit 35    PT Start Time 1102    PT Stop Time 1144    PT Time Calculation (min) 42 min    Equipment Utilized During Treatment Gait belt    Activity Tolerance Patient tolerated treatment well;No increased pain    Behavior During Therapy WFL for tasks assessed/performed                  Past Medical History:  Diagnosis Date   Arthritis    Complication of anesthesia    nausea   Dyspnea    with exertion   Family history of adverse reaction to anesthesia    PONV- MOM   Hypertension    Hypothyroidism    PONV (postoperative nausea and vomiting)    Sleep apnea    uses CPAP nightly   Stress incontinence    Valvular insufficiency, mitral    Past Surgical History:  Procedure Laterality Date   ANKLE ARTHROSCOPY WITH ARTHRODESIS Left 12/05/2021   Procedure: REPAIR OF TIBIAL NONUNION WITH AUTOGRAFT, TIBIOTALAR ARTHRODESIS;  Surgeon: Erle Crocker, MD;  Location: [place];  Service: Orthopedics;  Laterality: Left;   APPLICATION OF WOUND VAC Right 01/04/2021   Procedure: APPLICATION OF WOUND VAC;  Surgeon: Altamese [place], MD;  Location: [place];  Service: Orthopedics;  Laterality: Right;   BREAST CYST ASPIRATION Bilateral    neg   BREAST EXCISIONAL BIOPSY Right    1970's   CATARACT EXTRACTION W/ INTRAOCULAR LENS  IMPLANT, BILATERAL Bilateral    COLONOSCOPY     DILATION AND CURETTAGE OF UTERUS     HARDWARE REMOVAL Left 12/05/2021   Procedure: LEFT ANKLE DEEP HARDWARE REMOVAL, MEDIAL AND LATERAL;  Surgeon: Erle Crocker, MD;  Location: [place];  Service: Orthopedics;  Laterality: Left;  LENGTH OF  SURGERY: 150 MINUTES   I & D EXTREMITY Left 01/04/2021   Procedure: IRRIGATION AND DEBRIDEMENT LEFT ANKLE;  Surgeon: Altamese [place], MD;  Location: [place];  Service: Orthopedics;  Laterality: Left;   I & D EXTREMITY Left 01/07/2021   Procedure: IRRIGATION AND DEBRIDEMENT EXTREMITY, Left ankle;  Surgeon: Altamese [place], MD;  Location: [place];  Service: Orthopedics;  Laterality: Left;   KNEE ARTHROPLASTY Left 11/24/2016   Procedure: COMPUTER ASSISTED TOTAL KNEE ARTHROPLASTY;  Surgeon: Dereck Leep, MD;  Location: [facility];  Service: Orthopedics;  Laterality: Left;   KNEE ARTHROPLASTY Right 12/31/2020   Procedure: COMPUTER ASSISTED TOTAL KNEE ARTHROPLASTY;  Surgeon: Dereck Leep, MD;  Location: [facility];  Service: Orthopedics;  Laterality: Right;   KNEE ARTHROSCOPY Left    KNEE ARTHROSCOPY Left 07/18/2015   Procedure: ARTHROSCOPY KNEE WITH MEDIAL COMPARTMENTAL MENICUS CHONDROPLASTY;  Surgeon: Dereck Leep, MD;  Location: [facility];  Service: Orthopedics;  Laterality: Left;   KNEE ARTHROSCOPY WITH LATERAL MENISECTOMY Left 07/18/2015   Procedure: LEFT KNEE ARTHROSCOPY WITH PARTIAL LATERAL MENISECTOMY GRADE 4 CHONDROMYLASIA LATERAL TIBIAL PLATEAU;  Surgeon: Dereck Leep, MD;  Location: [facility];  Service: Orthopedics;  Laterality: Left;   ORIF ANKLE FRACTURE Left 01/04/2021   Procedure: OPEN REDUCTION INTERNAL FIXATION (ORIF) ANKLE  FRACTURE;  Surgeon: Altamese [place], MD;  Location: [place];  Service: Orthopedics;  Laterality: Left;   ORIF ANKLE FRACTURE Left 01/07/2021   Procedure: SCREW REMOVAL ANKLE REMOVAL OF WOUND VAC LEFT LEG;  Surgeon: Altamese [place], MD;  Location: [place];  Service: Orthopedics;  Laterality: Left;   Patient Active Problem List   Diagnosis Date Noted   Arthritis of left ankle 12/05/2021   Sleep disturbance    Acute blood loss anemia 01/18/2021   Hypokalemia 01/18/2021   Constipation 01/18/2021   Trauma 01/08/2021   Open left ankle fracture, sequela 01/03/2021   Essential  hypertension 01/03/2021   Hypothyroidism 01/03/2021   Total knee replacement status 12/31/2020   Family history of heart disease 04/12/2018   History of total knee arthroplasty, left 11/24/2016   Adult idiopathic generalized osteoporosis 03/01/2015   OSA on CPAP 01/30/2014    PCP: Emily Filbert, MD  REFERRING PROVIDER: Erle Crocker, MD  REFERRING DIAG: Left tibiotalar arthrodesis with repair of tibial nonunion and hardware removal, 12/06/21  THERAPY DIAG:  Pain in left ankle and joints of left foot  Abnormality of gait and mobility  Stiffness of left ankle, not elsewhere classified  Muscle weakness (generalized)  Difficulty in walking, not elsewhere classified  Pain  Rationale for Evaluation and Treatment: Rehabilitation  ONSET DATE: 12/06/21   SUBJECTIVE:   SUBJECTIVE STATEMENT:  Pt reports she did receive her new cane. States mostly using cane for mobility and trying to walk some without a device as advised. Reports ongoing pain (top of left ankle- in joint) - reports 2/10 left ankle pain.     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 PMH includes HTN, valvular insufficiency, SUI.    PAIN:  Are you having pain? Yes: NPRS scale: 2/10 Pain location: more in the ankle Pain description: soreness Aggravating factors: Being on the knee scooter is more problematic on the surgically fixed knee.  PRECAUTIONS: Fall  WEIGHT BEARING RESTRICTIONS: Other: Now Weight bearing as tolerated - current using a walking boot  FALLS:  Has patient fallen in last 6 months? No  LIVING ENVIRONMENT: Lives with: lives with their spouse Lives in: House/apartment Stairs: Yes: Internal: 15 steps; on left going up and External: 4 steps; bilateral but cannot reach both Has following equipment at home:  Single point cane, Walker - 2 wheeled, Environmental consultant - 4 wheeled, Crutches, Electronics engineer, bed side commode, and Knee Scooter  OCCUPATION: Retired  PLOF: Independent  PATIENT GOALS: Pt wants to be able to go up/down the flight of steps.  NEXT MD VISIT:   OBJECTIVE:   DIAGNOSTIC FINDINGS:   CLINICAL DATA:  Fluoroscopic assistance for revision of internal fixation in left ankle   EXAM: LEFT ANKLE COMPLETE - 3+ VIEW   COMPARISON:  CT done on 09/24/2021   FINDINGS: There is interval placement of a new longer screw transfixing the medial malleolus. There is a new metallic plate along the anterior margin of distal tibia anchored to tibia and talus. Fluoroscopic time 1 minute and 11 seconds. Radiation dose 1.46 mGy.   IMPRESSION: Fluoroscopic assistance was provided for revision of internal fixation in left ankle.  PATIENT SURVEYS:  FOTO 40/50  COGNITION: Overall cognitive status: Within functional limits for tasks assessed     SENSATION: WFL  PALPATION: No tenderness surrounding the ankle  LOWER EXTREMITY  ROM:  Active ROM Right eval Left eval  Ankle dorsiflexion  2 deg  Ankle plantarflexion  13 deg  Ankle inversion  7 deg  Ankle eversion  3 deg   (Blank rows = not tested)   LOWER EXTREMITY SPECIAL TESTS:   Ankle special tests: Not performed at this time   FUNCTIONAL TESTS:   5 times sit to stand: 19.41 sec Timed up and go (TUG): 18.25 sec 10 meter walk test: 18.54 sec Dynamic Gait Index: Not performed due to weightbearing restrictions.   GAIT: Distance walked: Not performed due to weightbearing restrictions.    TODAY'S TREATMENT: DATE: 04/22/22  Reassessed goals today for PT Recertification visit- See goal section for all details.     PATIENT EDUCATION:  Education details: Pt educated on role of PT and services provided during current POC, along with prognosis and information about the clinic.  Person educated: Patient and Spouse Education  method: Explanation Education comprehension: verbalized understanding  HOME EXERCISE PROGRAM:  Access Code: [email or web address] URL: [email or web address] Date: 01/27/2022 Prepared by: [place]  - 1 x daily - 7 x weekly - 3 sets - 10 reps - Seated Heel Raise  - 1 x daily - 7 x weekly - 3 sets - 10 reps - Seated Long Arc Quad  - 1 x daily - 7 x weekly - 3 sets - 10 reps - Ankle Inversion Eversion Towel Slide  - 1 x daily - 7 x weekly - 3 sets - 10 reps - Arch Lifting  - 1 x daily - 7 x weekly - 3 sets - 10 reps   ASSESSMENT:  CLINICAL IMPRESSION:  Patient presents with good motivation for today's assessment visit. She was reassessed and demonstrating good progress including: improved FOTO score indicating improved self perceived function; improved Timed up and go and gait speed with decreased risk of falling; improved functional endurance and gait efficiency as seen by 6 min walk. She actually met her LE strength goal and reporting less overall pain in ankle= 2/10. Patient's condition has the potential to improve in response to therapy. Maximum improvement is yet to be obtained. The anticipated improvement is attainable and reasonable in a generally predictable time.  Patient reports    Pt will continue to benefit from skilled therapy to address remaining deficits in order to improve overall QoL and return to PLOF.         OBJECTIVE IMPAIRMENTS: Abnormal gait, decreased balance, decreased mobility, difficulty walking, decreased ROM, decreased strength, hypomobility, impaired flexibility, and pain.   ACTIVITY LIMITATIONS: standing, squatting, stairs, transfers, bed mobility, bathing, dressing, and locomotion level  PARTICIPATION LIMITATIONS: meal prep, cleaning, laundry, driving, shopping, community activity, yard work, and church  PERSONAL FACTORS: Age, Past/current experiences, Time since onset of injury/illness/exacerbation, and 3+ comorbidities:  arthritis, HTN, generalized osteoporosis  are also affecting patient's functional outcome.   REHAB POTENTIAL: Good  CLINICAL DECISION MAKING: Stable/uncomplicated  EVALUATION COMPLEXITY: Moderate   GOALS: Goals reviewed with patient? Yes  SHORT TERM GOALS: Target date: 02/24/2022    Pt will be independent with HEP in order to demonstrate increased ability to perform tasks related to occupation/hobbies. Baseline: Pt given HEP at initial evaluation; 03/05/2022- Patient verbalized compliance with HEP and able to return demonstration of ankle ROM and LE strenghthening. Review general LE strengthening with patient and husband. Goal status: MET   LONG TERM GOALS: Target date: 07/14/2022  1.  Patient (> 91 years old) will complete five times sit to stand  test in < 15 seconds indicating an increased LE strength and improved balance. Baseline: Not performed due to weightbearing restrictions. 03/05/2022- Deferred - waiting to discuss any restrictions with MD 2/12: 16.2 sec; 04/21/2022= 11 sec Goal status: MET  2.  Patient will increase FOTO score to equal to or greater than  50   to demonstrate statistically significant improvement in mobility and quality of life.  Baseline: FOTO: 40 2/12: 47; 04/21/2022= 50 Goal status: PROGRESSING   3.  Patient will increase Berg Balance score by > 6 points to demonstrate decreased fall risk during functional activities. Baseline: Not performed due to time constraints. 2/12: 48/56; did not assess due to time constraints Goal status: ONGOING   4.  Patient will reduce timed up and go to <11 seconds to reduce fall risk and demonstrate improved transfer/gait ability. Baseline: 11.39 sec with walker 2/12:16.1 sec with SPC; 04/21/2022= 11.69 sec with use of SPC  Goal status: Progressing  5.  Patient will increase 10 meter walk test to >1.74ms as to improve gait speed for better community ambulation and to reduce fall risk. Baseline: 18.59 sec - 0.54 m/s with walker  2/12: .52 m/s with SPC; 04/21/2022= 0.58 m/s with increased VC to take longer steps.  Goal status: Progressing   6.  Patient will increase six minute walk test distance to >1000 for progression to community ambulator and improve gait ability Baseline: Not performed at this time and will perform at later date. 2/12: 500 feet with no brace and with SPC some soreness in the  ankle; 04/21/2022= 535 feet with use of SPC Goal status: Progressing.    7. Patient will report ability to return to performing more functional activities including cleaning tub/toilet and vacuuming without LOB or significant difficulty.  Baseline: 04/21/2022-Patient reports she has significant difficulty with cleaning toilet and tub including vacuuming.  Goal status: INITIAL   PLAN:  PT FREQUENCY: 1-2x/week  PT DURATION: 12 weeks  PLANNED INTERVENTIONS: Therapeutic exercises, Therapeutic activity, Neuromuscular re-education, Balance training, Gait training, Patient/Family education, Self Care, Joint mobilization, Stair training, DME instructions, Aquatic Therapy, Dry Needling, Electrical stimulation, Cryotherapy, Moist heat, scar mobilization, Ultrasound, Parrafin, Fluidotherapy, and Manual therapy  PLAN FOR NEXT SESSION:   Continue with progressive gait sessions with/without cane as appropriate.  Continue with weightbearing if tolerated. Continue with manual therapy for ROM and pain relief. Add progressive LE strengthening as appropriate.   JOllen Bowl PT Physical Therapist - [facility] 04/22/22, 10:35 AM

## 2022-04-28 ENCOUNTER — Ambulatory Visit: Payer: Medicare Other

## 2022-04-28 DIAGNOSIS — R262 Difficulty in walking, not elsewhere classified: Secondary | ICD-10-CM

## 2022-04-28 DIAGNOSIS — M25572 Pain in left ankle and joints of left foot: Secondary | ICD-10-CM

## 2022-04-28 DIAGNOSIS — M25672 Stiffness of left ankle, not elsewhere classified: Secondary | ICD-10-CM

## 2022-04-28 DIAGNOSIS — R52 Pain, unspecified: Secondary | ICD-10-CM

## 2022-04-28 DIAGNOSIS — R269 Unspecified abnormalities of gait and mobility: Secondary | ICD-10-CM

## 2022-04-28 DIAGNOSIS — M6281 Muscle weakness (generalized): Secondary | ICD-10-CM

## 2022-04-28 NOTE — Therapy (Signed)
OUTPATIENT PHYSICAL THERAPY LOWER EXTREMITY TREATMENT   Patient Name: Hannah Tucker MRN: [id number] DOB:1940/07/24, 82 y.o., female Today's Date: 04/28/2022  END OF SESSION:   PT End of Session - 04/28/22 1007     Visit Number 24    Number of Visits 17    Date for PT Re-Evaluation 07/14/22    Authorization Type Medicare A&B Primary    Progress Note Due on Visit 45    PT Start Time 1010    PT Stop Time 1052    PT Time Calculation (min) 42 min    Equipment Utilized During Treatment Gait belt    Activity Tolerance Patient tolerated treatment well;No increased pain    Behavior During Therapy WFL for tasks assessed/performed                   Past Medical History:  Diagnosis Date   Arthritis    Complication of anesthesia    nausea   Dyspnea    with exertion   Family history of adverse reaction to anesthesia    PONV- MOM   Hypertension    Hypothyroidism    PONV (postoperative nausea and vomiting)    Sleep apnea    uses CPAP nightly   Stress incontinence    Valvular insufficiency, mitral    Past Surgical History:  Procedure Laterality Date   ANKLE ARTHROSCOPY WITH ARTHRODESIS Left 12/05/2021   Procedure: REPAIR OF TIBIAL NONUNION WITH AUTOGRAFT, TIBIOTALAR ARTHRODESIS;  Surgeon: Erle Crocker, MD;  Location: [place];  Service: Orthopedics;  Laterality: Left;   APPLICATION OF WOUND VAC Right 01/04/2021   Procedure: APPLICATION OF WOUND VAC;  Surgeon: Altamese [place], MD;  Location: [place];  Service: Orthopedics;  Laterality: Right;   BREAST CYST ASPIRATION Bilateral    neg   BREAST EXCISIONAL BIOPSY Right    1970's   CATARACT EXTRACTION W/ INTRAOCULAR LENS  IMPLANT, BILATERAL Bilateral    COLONOSCOPY     DILATION AND CURETTAGE OF UTERUS     HARDWARE REMOVAL Left 12/05/2021   Procedure: LEFT ANKLE DEEP HARDWARE REMOVAL, MEDIAL AND LATERAL;  Surgeon: Erle Crocker, MD;  Location: [place];  Service: Orthopedics;  Laterality: Left;  LENGTH OF SURGERY: 150  MINUTES   I & D EXTREMITY Left 01/04/2021   Procedure: IRRIGATION AND DEBRIDEMENT LEFT ANKLE;  Surgeon: Altamese [place], MD;  Location: [place];  Service: Orthopedics;  Laterality: Left;   I & D EXTREMITY Left 01/07/2021   Procedure: IRRIGATION AND DEBRIDEMENT EXTREMITY, Left ankle;  Surgeon: Altamese [place], MD;  Location: [facility];  Service: Orthopedics;  Laterality: Left;   KNEE ARTHROPLASTY Left 11/24/2016   Procedure: COMPUTER ASSISTED TOTAL KNEE ARTHROPLASTY;  Surgeon: Dereck Leep, MD;  Location: [facility];  Service: Orthopedics;  Laterality: Left;   KNEE ARTHROPLASTY Right 12/31/2020   Procedure: COMPUTER ASSISTED TOTAL KNEE ARTHROPLASTY;  Surgeon: Dereck Leep, MD;  Location: [facility];  Service: Orthopedics;  Laterality: Right;   KNEE ARTHROSCOPY Left    KNEE ARTHROSCOPY Left 07/18/2015   Procedure: ARTHROSCOPY KNEE WITH MEDIAL COMPARTMENTAL MENICUS CHONDROPLASTY;  Surgeon: Dereck Leep, MD;  Location: [facility];  Service: Orthopedics;  Laterality: Left;   KNEE ARTHROSCOPY WITH LATERAL MENISECTOMY Left 07/18/2015   Procedure: LEFT KNEE ARTHROSCOPY WITH PARTIAL LATERAL MENISECTOMY GRADE 4 CHONDROMYLASIA LATERAL TIBIAL PLATEAU;  Surgeon: Dereck Leep, MD;  Location: [facility];  Service: Orthopedics;  Laterality: Left;   ORIF ANKLE FRACTURE Left 01/04/2021   Procedure: OPEN REDUCTION INTERNAL FIXATION (ORIF)  ANKLE FRACTURE;  Surgeon: Altamese [place], MD;  Location: [place];  Service: Orthopedics;  Laterality: Left;   ORIF ANKLE FRACTURE Left 01/07/2021   Procedure: SCREW REMOVAL ANKLE REMOVAL OF WOUND VAC LEFT LEG;  Surgeon: Altamese [place], MD;  Location: [place];  Service: Orthopedics;  Laterality: Left;   Patient Active Problem List   Diagnosis Date Noted   Arthritis of left ankle 12/05/2021   Sleep disturbance    Acute blood loss anemia 01/18/2021   Hypokalemia 01/18/2021   Constipation 01/18/2021   Trauma 01/08/2021   Open left ankle fracture, sequela 01/03/2021   Essential hypertension  01/03/2021   Hypothyroidism 01/03/2021   Total knee replacement status 12/31/2020   Family history of heart disease 04/12/2018   History of total knee arthroplasty, left 11/24/2016   Adult idiopathic generalized osteoporosis 03/01/2015   OSA on CPAP 01/30/2014    PCP: Emily Filbert, MD  REFERRING PROVIDER: Erle Crocker, MD  REFERRING DIAG: Left tibiotalar arthrodesis with repair of tibial nonunion and hardware removal, 12/06/21  THERAPY DIAG:  Pain in left ankle and joints of left foot  Abnormality of gait and mobility  Stiffness of left ankle, not elsewhere classified  Muscle weakness (generalized)  Difficulty in walking, not elsewhere classified  Pain  Rationale for Evaluation and Treatment: Rehabilitation  ONSET DATE: 12/06/21   SUBJECTIVE:   SUBJECTIVE STATEMENT:  Pt reports that they are both hungry due to expecting to go to the cafeteria, but it closed at 9:30 and wouldn't be open until 11:00.  Pt notes that she was in flats yesterday for most of the day, and states that she walked better with them and they were more comfortable than her sneaker.  She states she wore some compression stockings with them, and while she felt better, she experienced more swelling than she does normally with the sneakers.    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 PMH includes HTN, valvular insufficiency, SUI.    PAIN:  Are you having pain? Yes: NPRS scale: 2/10 Pain location: more in the ankle Pain description: soreness Aggravating factors: Being on the knee scooter is more problematic on the surgically fixed knee.  PRECAUTIONS: Fall  WEIGHT BEARING RESTRICTIONS: Other: Now Weight bearing as tolerated - current using a walking boot  FALLS:  Has patient fallen in last 6 months? No  LIVING  ENVIRONMENT: Lives with: lives with their spouse Lives in: House/apartment Stairs: Yes: Internal: 15 steps; on left going up and External: 4 steps; bilateral but cannot reach both Has following equipment at home: Single point cane, Walker - 2 wheeled, Environmental consultant - 4 wheeled, Crutches, Electronics engineer, bed side commode, and Knee Scooter  OCCUPATION: Retired  PLOF: Independent  PATIENT GOALS: Pt wants to be able to go up/down the flight of steps.  NEXT MD VISIT:   OBJECTIVE:   DIAGNOSTIC FINDINGS:   CLINICAL DATA:  Fluoroscopic assistance for revision of internal fixation in left ankle   EXAM: LEFT ANKLE COMPLETE - 3+ VIEW   COMPARISON:  CT done on 09/24/2021   FINDINGS: There is interval placement of a new longer screw transfixing the medial malleolus. There is a new metallic plate along the anterior margin of distal tibia anchored to tibia and talus. Fluoroscopic time 1 minute and 11 seconds. Radiation dose 1.46 mGy.   IMPRESSION: Fluoroscopic assistance  was provided for revision of internal fixation in left ankle.  PATIENT SURVEYS:  FOTO 40/50  COGNITION: Overall cognitive status: Within functional limits for tasks assessed     SENSATION: WFL  PALPATION: No tenderness surrounding the ankle  LOWER EXTREMITY ROM:  Active ROM Right eval Left eval  Ankle dorsiflexion  2 deg  Ankle plantarflexion  13 deg  Ankle inversion  7 deg  Ankle eversion  3 deg   (Blank rows = not tested)   LOWER EXTREMITY SPECIAL TESTS:   Ankle special tests: Not performed at this time   FUNCTIONAL TESTS:   5 times sit to stand: 19.41 sec Timed up and go (TUG): 18.25 sec 10 meter walk test: 18.54 sec Dynamic Gait Index: Not performed due to weightbearing restrictions.   GAIT: Distance walked: Not performed due to weightbearing restrictions.    TODAY'S TREATMENT: DATE: 04/28/22   Neuro:  Static stance on airex pad, 30 sec bouts x2 Static stance on airex pad with head  nods, 30 sec bouts x2 Static stance on airex pad with head turns, 30 sec bouts x2 Static stance on airex pad with eyes closed, 30 sec bouts x2 Static NBOS stance on airex pad, 30 sec bouts x2 Static NBOS stance on airex pad with head nods, 30 sec bouts x2 Static NBOS stance on airex pad with head turns, 30 sec bouts x2 Static NBOS stance on airex pad with eyes closed, 30 sec bouts x2 Static staggered stance on airex pad, 60 sec bouts x2 each LE leading Static staggered stance on airex pad with head nods, 60 sec bouts x2 each LE leading Static staggered stance on airex pad with head turns, 60 sec bouts x2 each LE leading Static staggered stance on airex pad with eyes closed, 30 sec bouts x2      Gait:  Ambulation around the gym 148' with cane and no ankle brace, x2 laps with no rest break and no increase in pain noted.  Educated provided on footwear and cause of hip discomfort due to lateral weight shift when ambulating.    PATIENT EDUCATION:  Education details: Pt educated on role of PT and services provided during current POC, along with prognosis and information about the clinic.  Person educated: Patient and Spouse Education method: Explanation Education comprehension: verbalized understanding  HOME EXERCISE PROGRAM:  Access Code: [email or web address] URL: [email or web address] Date: 01/27/2022 Prepared by: [place]  - 1 x daily - 7 x weekly - 3 sets - 10 reps - Seated Heel Raise  - 1 x daily - 7 x weekly - 3 sets - 10 reps - Seated Long Arc Quad  - 1 x daily - 7 x weekly - 3 sets - 10 reps - Ankle Inversion Eversion Towel Slide  - 1 x daily - 7 x weekly - 3 sets - 10 reps - Arch Lifting  - 1 x daily - 7 x weekly - 3 sets - 10 reps   ASSESSMENT:  CLINICAL IMPRESSION:  Pt performed well with the neuromuscular re-education portion of the treatment session today.  Pt does still have some weakness in the intrinsic musculature of the  LE's as noted by the shaking in the foot when standing on unstable surface.  Pt to continue with practicing on unstable surfaces and consistent evaluation of gait pattern to prevent further pain in the R hip.  Pt may benefit from increased hip strengthening in order to address the instability when walking as well.  Pt provided education regarding footwear and likely reasoning for swelling/relief she felt with having no support across the anterior portion of the foot.   Pt will continue to benefit from skilled therapy to address remaining deficits in order to improve overall QoL and return to PLOF.          OBJECTIVE IMPAIRMENTS: Abnormal gait, decreased balance, decreased mobility, difficulty walking, decreased ROM, decreased strength, hypomobility, impaired flexibility, and pain.   ACTIVITY LIMITATIONS: standing, squatting, stairs, transfers, bed mobility, bathing, dressing, and locomotion level  PARTICIPATION LIMITATIONS: meal prep, cleaning, laundry, driving, shopping, community activity, yard work, and church  PERSONAL FACTORS: Age, Past/current experiences, Time since onset of injury/illness/exacerbation, and 3+ comorbidities: arthritis, HTN, generalized osteoporosis  are also affecting patient's functional outcome.   REHAB POTENTIAL: Good  CLINICAL DECISION MAKING: Stable/uncomplicated  EVALUATION COMPLEXITY: Moderate   GOALS: Goals reviewed with patient? Yes  SHORT TERM GOALS: Target date: 02/24/2022    Pt will be independent with HEP in order to demonstrate increased ability to perform tasks related to occupation/hobbies. Baseline: Pt given HEP at initial evaluation; 03/05/2022- Patient verbalized compliance with HEP and able to return demonstration of ankle ROM and LE strenghthening. Review general LE strengthening with patient and husband. Goal status: MET   LONG TERM GOALS: Target date: 07/14/2022  1.  Patient (> 11 years old) will complete five times sit to stand test in <  15 seconds indicating an increased LE strength and improved balance. Baseline: Not performed due to weightbearing restrictions. 03/05/2022- Deferred - waiting to discuss any restrictions with MD 2/12: 16.2 sec; 04/21/2022= 11 sec Goal status: MET  2.  Patient will increase FOTO score to equal to or greater than  50   to demonstrate statistically significant improvement in mobility and quality of life.  Baseline: FOTO: 40 2/12: 47; 04/21/2022= 50 Goal status: PROGRESSING   3.  Patient will increase Berg Balance score by > 6 points to demonstrate decreased fall risk during functional activities. Baseline: Not performed due to time constraints. 2/12: 48/56; did not assess due to time constraints Goal status: ONGOING   4.  Patient will reduce timed up and go to <11 seconds to reduce fall risk and demonstrate improved transfer/gait ability. Baseline: 11.39 sec with walker 2/12:16.1 sec with SPC; 04/21/2022= 11.69 sec with use of SPC  Goal status: Progressing  5.  Patient will increase 10 meter walk test to >1.62ms as to improve gait speed for better community ambulation and to reduce fall risk. Baseline: 18.59 sec - 0.54 m/s with walker 2/12: .52 m/s with SPC; 04/21/2022= 0.58 m/s with increased VC to take longer steps.  Goal status: Progressing   6.  Patient will increase six minute walk test distance to >1000 for progression to community ambulator and improve gait ability Baseline: Not performed at this time and will perform at later date. 2/12: 500 feet with no brace and with SPC some soreness in the  ankle; 04/21/2022= 535 feet with use of SPC Goal status: Progressing.    7. Patient will report ability to return to performing more functional activities including cleaning tub/toilet and vacuuming without LOB or significant difficulty.  Baseline: 04/21/2022-Patient reports she has significant difficulty with cleaning toilet and tub including vacuuming.  Goal status: INITIAL   PLAN:  PT FREQUENCY:  1-2x/week  PT DURATION: 12 weeks  PLANNED INTERVENTIONS: Therapeutic exercises, Therapeutic activity, Neuromuscular re-education, Balance training, Gait training, Patient/Family education, Self Care, Joint mobilization, Stair training, DME instructions, Aquatic Therapy, Dry Needling, Electrical  stimulation, Cryotherapy, Moist heat, scar mobilization, Ultrasound, Parrafin, Fluidotherapy, and Manual therapy  PLAN FOR NEXT SESSION:   Address hip weakness as potential problem with gait at this time. Continue with progressive gait sessions with/without cane as appropriate.  Continue with manual therapy for ROM and pain relief. Add progressive LE strengthening as appropriate.    Gwenlyn Saran, PT, DPT Physical Therapist - [facility]  04/28/22, 11:42 AM

## 2022-04-30 ENCOUNTER — Ambulatory Visit: Payer: Medicare Other

## 2022-05-08 ENCOUNTER — Ambulatory Visit: Payer: Medicare Other | Attending: Orthopaedic Surgery | Admitting: Physical Therapy

## 2022-05-08 DIAGNOSIS — R262 Difficulty in walking, not elsewhere classified: Secondary | ICD-10-CM | POA: Diagnosis present

## 2022-05-08 DIAGNOSIS — R269 Unspecified abnormalities of gait and mobility: Secondary | ICD-10-CM | POA: Insufficient documentation

## 2022-05-08 DIAGNOSIS — M25572 Pain in left ankle and joints of left foot: Secondary | ICD-10-CM | POA: Insufficient documentation

## 2022-05-08 DIAGNOSIS — R52 Pain, unspecified: Secondary | ICD-10-CM | POA: Insufficient documentation

## 2022-05-08 DIAGNOSIS — M6281 Muscle weakness (generalized): Secondary | ICD-10-CM | POA: Diagnosis present

## 2022-05-08 DIAGNOSIS — M25672 Stiffness of left ankle, not elsewhere classified: Secondary | ICD-10-CM | POA: Insufficient documentation

## 2022-05-08 NOTE — Therapy (Signed)
OUTPATIENT PHYSICAL THERAPY LOWER EXTREMITY TREATMENT   Patient Name: Hannah Tucker MRN: [id number] DOB:January 25, 1941, 82 y.o., female Today's Date: 05/08/2022  END OF SESSION:   PT End of Session - 05/08/22 1629     Visit Number 25    Number of Visits 32    Date for PT Re-Evaluation 07/14/22    Authorization Type Medicare A&B Primary    Progress Note Due on Visit 48    PT Start Time 1155    PT Stop Time 1230    PT Time Calculation (min) 35 min    Equipment Utilized During Treatment Gait belt    Activity Tolerance Patient tolerated treatment well;No increased pain    Behavior During Therapy WFL for tasks assessed/performed                    Past Medical History:  Diagnosis Date   Arthritis    Complication of anesthesia    nausea   Dyspnea    with exertion   Family history of adverse reaction to anesthesia    PONV- MOM   Hypertension    Hypothyroidism    PONV (postoperative nausea and vomiting)    Sleep apnea    uses CPAP nightly   Stress incontinence    Valvular insufficiency, mitral    Past Surgical History:  Procedure Laterality Date   ANKLE ARTHROSCOPY WITH ARTHRODESIS Left 12/05/2021   Procedure: REPAIR OF TIBIAL NONUNION WITH AUTOGRAFT, TIBIOTALAR ARTHRODESIS;  Surgeon: Erle Crocker, MD;  Location: [place];  Service: Orthopedics;  Laterality: Left;   APPLICATION OF WOUND VAC Right 01/04/2021   Procedure: APPLICATION OF WOUND VAC;  Surgeon: Altamese [place], MD;  Location: [place];  Service: Orthopedics;  Laterality: Right;   BREAST CYST ASPIRATION Bilateral    neg   BREAST EXCISIONAL BIOPSY Right    1970's   CATARACT EXTRACTION W/ INTRAOCULAR LENS  IMPLANT, BILATERAL Bilateral    COLONOSCOPY     DILATION AND CURETTAGE OF UTERUS     HARDWARE REMOVAL Left 12/05/2021   Procedure: LEFT ANKLE DEEP HARDWARE REMOVAL, MEDIAL AND LATERAL;  Surgeon: Erle Crocker, MD;  Location: [place];  Service: Orthopedics;  Laterality: Left;  LENGTH OF SURGERY: 150  MINUTES   I & D EXTREMITY Left 01/04/2021   Procedure: IRRIGATION AND DEBRIDEMENT LEFT ANKLE;  Surgeon: Altamese [place], MD;  Location: [place];  Service: Orthopedics;  Laterality: Left;   I & D EXTREMITY Left 01/07/2021   Procedure: IRRIGATION AND DEBRIDEMENT EXTREMITY, Left ankle;  Surgeon: Altamese [place], MD;  Location: [place];  Service: Orthopedics;  Laterality: Left;   KNEE ARTHROPLASTY Left 11/24/2016   Procedure: COMPUTER ASSISTED TOTAL KNEE ARTHROPLASTY;  Surgeon: Dereck Leep, MD;  Location: [facility];  Service: Orthopedics;  Laterality: Left;   KNEE ARTHROPLASTY Right 12/31/2020   Procedure: COMPUTER ASSISTED TOTAL KNEE ARTHROPLASTY;  Surgeon: Dereck Leep, MD;  Location: [facility];  Service: Orthopedics;  Laterality: Right;   KNEE ARTHROSCOPY Left    KNEE ARTHROSCOPY Left 07/18/2015   Procedure: ARTHROSCOPY KNEE WITH MEDIAL COMPARTMENTAL MENICUS CHONDROPLASTY;  Surgeon: Dereck Leep, MD;  Location: [facility];  Service: Orthopedics;  Laterality: Left;   KNEE ARTHROSCOPY WITH LATERAL MENISECTOMY Left 07/18/2015   Procedure: LEFT KNEE ARTHROSCOPY WITH PARTIAL LATERAL MENISECTOMY GRADE 4 CHONDROMYLASIA LATERAL TIBIAL PLATEAU;  Surgeon: Dereck Leep, MD;  Location: [facility];  Service: Orthopedics;  Laterality: Left;   ORIF ANKLE FRACTURE Left 01/04/2021   Procedure: OPEN REDUCTION INTERNAL FIXATION (  ORIF) ANKLE FRACTURE;  Surgeon: Altamese [place], MD;  Location: [place];  Service: Orthopedics;  Laterality: Left;   ORIF ANKLE FRACTURE Left 01/07/2021   Procedure: SCREW REMOVAL ANKLE REMOVAL OF WOUND VAC LEFT LEG;  Surgeon: Altamese [place], MD;  [facility] [place];  Service: Orthopedics;  Laterality: Left;   Patient Active Problem List   Diagnosis Date Noted   Arthritis of left ankle 12/05/2021   Sleep disturbance    Acute blood loss anemia 01/18/2021   Hypokalemia 01/18/2021   Constipation 01/18/2021   Trauma 01/08/2021   Open left ankle fracture, sequela 01/03/2021   Essential hypertension  01/03/2021   Hypothyroidism 01/03/2021   Total knee replacement status 12/31/2020   Family history of heart disease 04/12/2018   History of total knee arthroplasty, left 11/24/2016   Adult idiopathic generalized osteoporosis 03/01/2015   OSA on CPAP 01/30/2014    PCP: Emily Filbert, MD  REFERRING PROVIDER: Erle Crocker, MD  REFERRING DIAG: Left tibiotalar arthrodesis with repair of tibial nonunion and hardware removal, 12/06/21  THERAPY DIAG:  Pain in left ankle and joints of left foot  Abnormality of gait and mobility  Stiffness of left ankle, not elsewhere classified  Muscle weakness (generalized)  Difficulty in walking, not elsewhere classified  Pain  Rationale for Evaluation and Treatment: Rehabilitation  ONSET DATE: 12/06/21   SUBJECTIVE:   SUBJECTIVE STATEMENT:  Pt reports mild pain 1/10 in the anterior and lateral L ankle. She is apologetic for being late to PT treatment.     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 PMH includes HTN, valvular insufficiency, SUI.    PAIN:  Are you having pain? Yes: NPRS scale: 2/10 Pain location: more in the ankle Pain description: soreness Aggravating factors: Being on the knee scooter is more problematic on the surgically fixed knee.  PRECAUTIONS: Fall  WEIGHT BEARING RESTRICTIONS: Other: Now Weight bearing as tolerated  no Boot or brace requeired  FALLS:  Has patient fallen in last 6 months? No  LIVING ENVIRONMENT: Lives with: lives with their spouse Lives in: House/apartment Stairs: Yes: Internal: 15 steps; on left going up and External: 4 steps; bilateral but cannot reach both Has following equipment at home: Single point cane, Walker - 2 wheeled, Environmental consultant - 4 wheeled, Crutches, Electronics engineer, bed side commode, and Knee  Scooter  OCCUPATION: Retired  PLOF: Independent  PATIENT GOALS: Pt wants to be able to go up/down the flight of steps.  NEXT MD VISIT:   OBJECTIVE:   DIAGNOSTIC FINDINGS:   CLINICAL DATA:  Fluoroscopic assistance for revision of internal fixation in left ankle   EXAM: LEFT ANKLE COMPLETE - 3+ VIEW   COMPARISON:  CT done on 09/24/2021   FINDINGS: There is interval placement of a new longer screw transfixing the medial malleolus. There is a new metallic plate along the anterior margin of distal tibia anchored to tibia and talus. Fluoroscopic time 1 minute and 11 seconds. Radiation dose 1.46 mGy.   IMPRESSION: Fluoroscopic assistance was provided for revision of internal fixation in left ankle.  PATIENT SURVEYS:  FOTO 40/50  COGNITION: Overall cognitive status: Within functional limits for tasks assessed     SENSATION: WFL  PALPATION: No tenderness surrounding the ankle  LOWER EXTREMITY ROM:  Active ROM Right eval Left eval  Ankle dorsiflexion  2 deg  Ankle  plantarflexion  13 deg  Ankle inversion  7 deg  Ankle eversion  3 deg   (Blank rows = not tested)   LOWER EXTREMITY SPECIAL TESTS: at eavl  Ankle special tests: Not performed at this time   FUNCTIONAL TESTS:   5 times sit to stand: 19.41 sec Timed up and go (TUG): 18.25 sec 10 meter walk test: 18.54 sec Dynamic Gait Index: Not performed due to weightbearing restrictions.   GAIT: At EVAL: Distance walked: Not performed due to weightbearing restrictions.    TODAY'S TREATMENT: DATE: 05/08/22   Neuro:  Standing on airex beam:  static without UE support 2x 30sec.  AP rock 2x 30 sec.  Side stepping R and L 2 sets 27f x 3 bil.  Tandem stance 2 x 30 sec bil with    Foot tap on Blazepods  Random setting : 4 pods; 1 min x 3 bouts.  Performed by tapping R 2 pods  with RLE and L 2 pods with LLE. Performed last bout on 5" step. Min assist from PT for safety with min cues for posture and  encouraged increased ankle movement on the L side when stepping on pods.    Gait:  Ambulation around the gym x 300' with cane and no ankle brace, no rest break and no increase in pain noted. Instruction for symmetry of step length Bil.      PATIENT EDUCATION:  Education details: positioning in elevated position for edema management over night.  Pt educated on role of PT and services provided during current POC, along with prognosis and information about the clinic.  Person educated: Patient and Spouse Education method: Explanation Education comprehension: verbalized understanding  HOME EXERCISE PROGRAM:  Access Code: [email or web address]: [email or web address] Date: 01/27/2022 Prepared by: JKoppel - 1 x daily - 7 x weekly - 3 sets - 10 reps - Seated Heel Raise  - 1 x daily - 7 x weekly - 3 sets - 10 reps - Seated Long Arc Quad  - 1 x daily - 7 x weekly - 3 sets - 10 reps - Ankle Inversion Eversion Towel Slide  - 1 x daily - 7 x weekly - 3 sets - 10 reps - Arch Lifting  - 1 x daily - 7 x weekly - 3 sets - 10 reps   ASSESSMENT:  CLINICAL IMPRESSION:  Pt progressing well with dynamic balance ankle stabilization exercises through balance training. Pt motivated to improve function and strength to return to PLOF and put forth good effort throughout treatment session. Limited by time on this session, as pt late to appointment. Educated on need for elevation while at rest and over night for edema management no that pt no longer requires boot or brace for ankle stability.   Pt will continue to benefit from skilled therapy to address remaining deficits in order to improve overall QoL and return to PLOF.         OBJECTIVE IMPAIRMENTS: Abnormal gait, decreased balance, decreased mobility, difficulty walking, decreased ROM, decreased strength, hypomobility, impaired flexibility, and pain.   ACTIVITY LIMITATIONS: standing, squatting, stairs,  transfers, bed mobility, bathing, dressing, and locomotion level  PARTICIPATION LIMITATIONS: meal prep, cleaning, laundry, driving, shopping, community activity, yard work, and church  PERSONAL FACTORS: Age, Past/current experiences, Time since onset of injury/illness/exacerbation, and 3+ comorbidities: arthritis, HTN, generalized osteoporosis  are also affecting patient's functional outcome.   REHAB POTENTIAL: Good  CLINICAL DECISION MAKING: Stable/uncomplicated  EVALUATION COMPLEXITY: Moderate  GOALS: Goals reviewed with patient? Yes  SHORT TERM GOALS: Target date: 02/24/2022    Pt will be independent with HEP in order to demonstrate increased ability to perform tasks related to occupation/hobbies. Baseline: Pt given HEP at initial evaluation; 03/05/2022- Patient verbalized compliance with HEP and able to return demonstration of ankle ROM and LE strenghthening. Review general LE strengthening with patient and husband. Goal status: MET   LONG TERM GOALS: Target date: 07/14/2022  1.  Patient (> 42 years old) will complete five times sit to stand test in < 15 seconds indicating an increased LE strength and improved balance. Baseline: Not performed due to weightbearing restrictions. 03/05/2022- Deferred - waiting to discuss any restrictions with MD 2/12: 16.2 sec; 04/21/2022= 11 sec Goal status: MET  2.  Patient will increase FOTO score to equal to or greater than  50   to demonstrate statistically significant improvement in mobility and quality of life.  Baseline: FOTO: 40 2/12: 47; 04/21/2022= 50 Goal status: PROGRESSING   3.  Patient will increase Berg Balance score by > 6 points to demonstrate decreased fall risk during functional activities. Baseline: Not performed due to time constraints. 2/12: 48/56; did not assess due to time constraints Goal status: ONGOING   4.  Patient will reduce timed up and go to <11 seconds to reduce fall risk and demonstrate improved transfer/gait  ability. Baseline: 11.39 sec with walker 2/12:16.1 sec with SPC; 04/21/2022= 11.69 sec with use of SPC  Goal status: Progressing  5.  Patient will increase 10 meter walk test to >1.54ms as to improve gait speed for better community ambulation and to reduce fall risk. Baseline: 18.59 sec - 0.54 m/s with walker 2/12: .52 m/s with SPC; 04/21/2022= 0.58 m/s with increased VC to take longer steps.  Goal status: Progressing   6.  Patient will increase six minute walk test distance to >1000 for progression to community ambulator and improve gait ability Baseline: Not performed at this time and will perform at later date. 2/12: 500 feet with no brace and with SPC some soreness in the  ankle; 04/21/2022= 535 feet with use of SPC Goal status: Progressing.    7. Patient will report ability to return to performing more functional activities including cleaning tub/toilet and vacuuming without LOB or significant difficulty.  Baseline: 04/21/2022-Patient reports she has significant difficulty with cleaning toilet and tub including vacuuming.  Goal status: INITIAL   PLAN:  PT FREQUENCY: 1-2x/week  PT DURATION: 12 weeks  PLANNED INTERVENTIONS: Therapeutic exercises, Therapeutic activity, Neuromuscular re-education, Balance training, Gait training, Patient/Family education, Self Care, Joint mobilization, Stair training, DME instructions, Aquatic Therapy, Dry Needling, Electrical stimulation, Cryotherapy, Moist heat, scar mobilization, Ultrasound, Parrafin, Fluidotherapy, and Manual therapy  PLAN FOR NEXT SESSION:   Address hip weakness as potential problem with gait at this time. Continue with progressive gait sessions with/without cane as appropriate.  Continue with manual therapy for ROM and pain relief. Add progressive LE strengthening as appropriate.    ABarrie FolkPT, DPT  Physical Therapist - [facility] 4:41 PM 05/08/22

## 2022-05-15 ENCOUNTER — Ambulatory Visit: Payer: Medicare Other | Admitting: Physical Therapy

## 2022-05-15 DIAGNOSIS — R262 Difficulty in walking, not elsewhere classified: Secondary | ICD-10-CM

## 2022-05-15 DIAGNOSIS — R269 Unspecified abnormalities of gait and mobility: Secondary | ICD-10-CM

## 2022-05-15 DIAGNOSIS — M25572 Pain in left ankle and joints of left foot: Secondary | ICD-10-CM | POA: Diagnosis not present

## 2022-05-15 DIAGNOSIS — M25672 Stiffness of left ankle, not elsewhere classified: Secondary | ICD-10-CM

## 2022-05-15 DIAGNOSIS — M6281 Muscle weakness (generalized): Secondary | ICD-10-CM

## 2022-05-15 NOTE — Therapy (Signed)
OUTPATIENT PHYSICAL THERAPY LOWER EXTREMITY TREATMENT   Patient Name: Hannah Tucker MRN: [id number] DOB:1940/09/07, 82 y.o., female Today's Date: 05/15/2022  END OF SESSION:   PT End of Session - 05/15/22 1438     Visit Number 26    Number of Visits 10    Date for PT Re-Evaluation 07/14/22    Authorization Type Medicare A&B Primary    Progress Note Due on Visit 73    PT Start Time 1435    PT Stop Time 1516    PT Time Calculation (min) 41 min    Equipment Utilized During Treatment Gait belt    Activity Tolerance Patient tolerated treatment well;No increased pain    Behavior During Therapy WFL for tasks assessed/performed                    Past Medical History:  Diagnosis Date   Arthritis    Complication of anesthesia    nausea   Dyspnea    with exertion   Family history of adverse reaction to anesthesia    PONV- MOM   Hypertension    Hypothyroidism    PONV (postoperative nausea and vomiting)    Sleep apnea    uses CPAP nightly   Stress incontinence    Valvular insufficiency, mitral    Past Surgical History:  Procedure Laterality Date   ANKLE ARTHROSCOPY WITH ARTHRODESIS Left 12/05/2021   Procedure: REPAIR OF TIBIAL NONUNION WITH AUTOGRAFT, TIBIOTALAR ARTHRODESIS;  Surgeon: Erle Crocker, MD;  Location: [facility];  Service: Orthopedics;  Laterality: Left;   APPLICATION OF WOUND VAC Right 01/04/2021   Procedure: APPLICATION OF WOUND VAC;  Surgeon: Altamese Benbow, MD;  Location: [facility];  Service: Orthopedics;  Laterality: Right;   BREAST CYST ASPIRATION Bilateral    neg   BREAST EXCISIONAL BIOPSY Right    1970's   CATARACT EXTRACTION W/ INTRAOCULAR LENS  IMPLANT, BILATERAL Bilateral    COLONOSCOPY     DILATION AND CURETTAGE OF UTERUS     HARDWARE REMOVAL Left 12/05/2021   Procedure: LEFT ANKLE DEEP HARDWARE REMOVAL, MEDIAL AND LATERAL;  Surgeon: Erle Crocker, MD;  Location: [facility];  Service: Orthopedics;  Laterality: Left;  LENGTH OF SURGERY: 150  MINUTES   I & D EXTREMITY Left 01/04/2021   Procedure: IRRIGATION AND DEBRIDEMENT LEFT ANKLE;  Surgeon: Altamese JAARS, MD;  Location: [facility];  Service: Orthopedics;  Laterality: Left;   I & D EXTREMITY Left 01/07/2021   Procedure: IRRIGATION AND DEBRIDEMENT EXTREMITY, Left ankle;  Surgeon: Altamese Clyde, MD;  Location: [facility];  Service: Orthopedics;  Laterality: Left;   KNEE ARTHROPLASTY Left 11/24/2016   Procedure: COMPUTER ASSISTED TOTAL KNEE ARTHROPLASTY;  Surgeon: Dereck Leep, MD;  Location: [facility];  Service: Orthopedics;  Laterality: Left;   KNEE ARTHROPLASTY Right 12/31/2020   Procedure: COMPUTER ASSISTED TOTAL KNEE ARTHROPLASTY;  Surgeon: Dereck Leep, MD;  Location: [facility];  Service: Orthopedics;  Laterality: Right;   KNEE ARTHROSCOPY Left    KNEE ARTHROSCOPY Left 07/18/2015   Procedure: ARTHROSCOPY KNEE WITH MEDIAL COMPARTMENTAL MENICUS CHONDROPLASTY;  Surgeon: Dereck Leep, MD;  Location: [facility];  Service: Orthopedics;  Laterality: Left;   KNEE ARTHROSCOPY WITH LATERAL MENISECTOMY Left 07/18/2015   Procedure: LEFT KNEE ARTHROSCOPY WITH PARTIAL LATERAL MENISECTOMY GRADE 4 CHONDROMYLASIA LATERAL TIBIAL PLATEAU;  Surgeon: Dereck Leep, MD;  Location: [facility];  Service: Orthopedics;  Laterality: Left;   ORIF ANKLE FRACTURE Left 01/04/2021   Procedure: OPEN REDUCTION INTERNAL FIXATION (  ORIF) ANKLE FRACTURE;  Surgeon: Altamese [place], MD;  Location: [facility];  Service: Orthopedics;  Laterality: Left;   ORIF ANKLE FRACTURE Left 01/07/2021   Procedure: SCREW REMOVAL ANKLE REMOVAL OF WOUND VAC LEFT LEG;  Surgeon: Altamese [place], MD;  Location: [facility];  Service: Orthopedics;  Laterality: Left;   Patient Active Problem List   Diagnosis Date Noted   Arthritis of left ankle 12/05/2021   Sleep disturbance    Acute blood loss anemia 01/18/2021   Hypokalemia 01/18/2021   Constipation 01/18/2021   Trauma 01/08/2021   Open left ankle fracture, sequela 01/03/2021   Essential hypertension  01/03/2021   Hypothyroidism 01/03/2021   Total knee replacement status 12/31/2020   Family history of heart disease 04/12/2018   History of total knee arthroplasty, left 11/24/2016   Adult idiopathic generalized osteoporosis 03/01/2015   OSA on CPAP 01/30/2014    PCP: Emily Filbert, MD  REFERRING PROVIDER: Erle Crocker, MD  REFERRING DIAG: Left tibiotalar arthrodesis with repair of tibial nonunion and hardware removal, 12/06/21  THERAPY DIAG:  Pain in left ankle and joints of left foot  Abnormality of gait and mobility  Stiffness of left ankle, not elsewhere classified  Muscle weakness (generalized)  Difficulty in walking, not elsewhere classified  Rationale for Evaluation and Treatment: Rehabilitation  ONSET DATE: 12/06/21   SUBJECTIVE:   SUBJECTIVE STATEMENT:  Pt reports mild pain 2/10 in the anterior and lateral L ankle. States that she has been able to walk over the gravel in the drive and around great room without use of SPC several time since last PT session.     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 PMH includes HTN, valvular insufficiency, SUI.    PAIN:  Are you having pain? Yes: NPRS scale: 2/10 Pain location: more in the ankle Pain description: soreness Aggravating factors: Being on the knee scooter is more problematic on the surgically fixed knee.  PRECAUTIONS: Fall  WEIGHT BEARING RESTRICTIONS: Other: Now Weight bearing as tolerated  no Boot or brace requeired  FALLS:  Has patient fallen in last 6 months? No  LIVING ENVIRONMENT: Lives with: lives with their spouse Lives in: House/apartment Stairs: Yes: Internal: 15 steps; on left going up and External: 4 steps; bilateral but cannot reach both Has following equipment at home: Single point cane, Walker - 2  wheeled, Environmental consultant - 4 wheeled, Crutches, Electronics engineer, bed side commode, and Knee Scooter  OCCUPATION: Retired  PLOF: Independent  PATIENT GOALS: Pt wants to be able to go up/down the flight of steps.  NEXT MD VISIT:   OBJECTIVE:   DIAGNOSTIC FINDINGS:   CLINICAL DATA:  Fluoroscopic assistance for revision of internal fixation in left ankle   EXAM: LEFT ANKLE COMPLETE - 3+ VIEW   COMPARISON:  CT done on 09/24/2021   FINDINGS: There is interval placement of a new longer screw transfixing the medial malleolus. There is a new metallic plate along the anterior margin of distal tibia anchored to tibia and talus. Fluoroscopic time 1 minute and 11 seconds. Radiation dose 1.46 mGy.   IMPRESSION: Fluoroscopic assistance was provided for revision of internal fixation in left ankle.  PATIENT SURVEYS:  FOTO 40/50  COGNITION: Overall cognitive status: Within functional limits for tasks assessed     SENSATION: WFL  PALPATION: No tenderness surrounding the ankle  LOWER  EXTREMITY ROM:  Active ROM Right eval Left eval  Ankle dorsiflexion  2 deg  Ankle plantarflexion  13 deg  Ankle inversion  7 deg  Ankle eversion  3 deg   (Blank rows = not tested)   LOWER EXTREMITY SPECIAL TESTS: at eavl  Ankle special tests: Not performed at this time   FUNCTIONAL TESTS:   5 times sit to stand: 19.41 sec Timed up and go (TUG): 18.25 sec 10 meter walk test: 18.54 sec Dynamic Gait Index: Not performed due to weightbearing restrictions.   GAIT: At EVAL: Distance walked: Not performed due to weightbearing restrictions.   ODAY'S TREATMENT: DATE: 05/08/22     Therex:  Ankle PF/DF AROM x 15  Ankle ER/IR AROM x 15 AAROM into end range DF 3 x 45 sec LAQ x 10 RTB Hip abduction GTB x 12 Hip flexion GTB x 12  Hip extension from sitting GTB x 12  Self soleus stretch ankel DF ROM on 6 inch step 3 x 30 sec Ankle DF RTB x 10  Ankle ER/IR RTB x 10  Toe flexion to pull pillow case 2  x 10  Toe extension AROM x 10 with hold at end range.   Pt required cues from full ROM and hold at end range. Mild increase in pain with ER and IR resisted movements.    PATIENT EDUCATION:  Education details: Pt educated throughout session about proper posture and technique with exercises. Improved exercise technique, movement at target joints, use of target muscles after min to mod verbal, visual, tactile cues.    Person educated: Patient and Spouse Education method: Explanation Education comprehension: verbalized understanding  HOME EXERCISE PROGRAM:  Access Code: [email or web address] URL: [email or web address] Date: 01/27/2022 Prepared by: [place]  - 1 x daily - 7 x weekly - 3 sets - 10 reps - Seated Heel Raise  - 1 x daily - 7 x weekly - 3 sets - 10 reps - Seated Long Arc Quad  - 1 x daily - 7 x weekly - 3 sets - 10 reps - Ankle Inversion Eversion Towel Slide  - 1 x daily - 7 x weekly - 3 sets - 10 reps - Arch Lifting  - 1 x daily - 7 x weekly - 3 sets - 10 reps   ASSESSMENT:  CLINICAL IMPRESSION:  Pt progressing well in Bil hip and ankle stregnth and mobility. Noted to continue to have limited ROM in the L ankle and R side hip weakness. Will need to continue to address asymmetry to prevent compensatory movement patterns. Pt demonstrating improved ankle stability and functional use in dynamic movements with reduced pain in mobility at home with reduced UE support.  Pt will continue to benefit from skilled therapy to address remaining deficits in order to improve overall QoL and return to PLOF.         OBJECTIVE IMPAIRMENTS: Abnormal gait, decreased balance, decreased mobility, difficulty walking, decreased ROM, decreased strength, hypomobility, impaired flexibility, and pain.   ACTIVITY LIMITATIONS: standing, squatting, stairs, transfers, bed mobility, bathing, dressing, and locomotion level  PARTICIPATION LIMITATIONS: meal prep,  cleaning, laundry, driving, shopping, community activity, yard work, and church  PERSONAL FACTORS: Age, Past/current experiences, Time since onset of injury/illness/exacerbation, and 3+ comorbidities: arthritis, HTN, generalized osteoporosis  are also affecting patient's functional outcome.   REHAB POTENTIAL: Good  CLINICAL DECISION MAKING: Stable/uncomplicated  EVALUATION COMPLEXITY: Moderate   GOALS: Goals reviewed with patient? Yes  SHORT TERM GOALS: Target date: 02/24/2022  Pt will be independent with HEP in order to demonstrate increased ability to perform tasks related to occupation/hobbies. Baseline: Pt given HEP at initial evaluation; 03/05/2022- Patient verbalized compliance with HEP and able to return demonstration of ankle ROM and LE strenghthening. Review general LE strengthening with patient and husband. Goal status: MET   LONG TERM GOALS: Target date: 07/14/2022  1.  Patient (> 22 years old) will complete five times sit to stand test in < 15 seconds indicating an increased LE strength and improved balance. Baseline: Not performed due to weightbearing restrictions. 03/05/2022- Deferred - waiting to discuss any restrictions with MD 2/12: 16.2 sec; 04/21/2022= 11 sec Goal status: MET  2.  Patient will increase FOTO score to equal to or greater than  50   to demonstrate statistically significant improvement in mobility and quality of life.  Baseline: FOTO: 40 2/12: 47; 04/21/2022= 50 Goal status: PROGRESSING   3.  Patient will increase Berg Balance score by > 6 points to demonstrate decreased fall risk during functional activities. Baseline: Not performed due to time constraints. 2/12: 48/56; did not assess due to time constraints Goal status: ONGOING   4.  Patient will reduce timed up and go to <11 seconds to reduce fall risk and demonstrate improved transfer/gait ability. Baseline: 11.39 sec with walker 2/12:16.1 sec with SPC; 04/21/2022= 11.69 sec with use of SPC  Goal  status: Progressing  5.  Patient will increase 10 meter walk test to >1.26m/s as to improve gait speed for better community ambulation and to reduce fall risk. Baseline: 18.59 sec - 0.54 m/s with walker 2/12: .52 m/s with SPC; 04/21/2022= 0.58 m/s with increased VC to take longer steps.  Goal status: Progressing   6.  Patient will increase six minute walk test distance to >1000 for progression to community ambulator and improve gait ability Baseline: Not performed at this time and will perform at later date. 2/12: 500 feet with no brace and with SPC some soreness in the  ankle; 04/21/2022= 535 feet with use of SPC Goal status: Progressing.    7. Patient will report ability to return to performing more functional activities including cleaning tub/toilet and vacuuming without LOB or significant difficulty.  Baseline: 04/21/2022-Patient reports she has significant difficulty with cleaning toilet and tub including vacuuming.  Goal status: INITIAL   PLAN:  PT FREQUENCY: 1-2x/week  PT DURATION: 12 weeks  PLANNED INTERVENTIONS: Therapeutic exercises, Therapeutic activity, Neuromuscular re-education, Balance training, Gait training, Patient/Family education, Self Care, Joint mobilization, Stair training, DME instructions, Aquatic Therapy, Dry Needling, Electrical stimulation, Cryotherapy, Moist heat, scar mobilization, Ultrasound, Parrafin, Fluidotherapy, and Manual therapy  PLAN FOR NEXT SESSION:   Ankle stability and balance training. Continue strengthening and ROM program. Continue with manual therapy for ROM and pain relief.   Barrie Folk PT, DPT  Physical Therapist - [facility]  5:35 PM 05/15/22

## 2022-05-22 ENCOUNTER — Encounter: Payer: Self-pay | Admitting: Physical Therapy

## 2022-05-22 ENCOUNTER — Ambulatory Visit: Payer: Medicare Other | Admitting: Physical Therapy

## 2022-05-22 DIAGNOSIS — R262 Difficulty in walking, not elsewhere classified: Secondary | ICD-10-CM

## 2022-05-22 DIAGNOSIS — M25672 Stiffness of left ankle, not elsewhere classified: Secondary | ICD-10-CM

## 2022-05-22 DIAGNOSIS — M25572 Pain in left ankle and joints of left foot: Secondary | ICD-10-CM

## 2022-05-22 DIAGNOSIS — R269 Unspecified abnormalities of gait and mobility: Secondary | ICD-10-CM

## 2022-05-22 NOTE — Therapy (Signed)
OUTPATIENT PHYSICAL THERAPY LOWER EXTREMITY TREATMENT   Patient Name: Hannah Tucker MRN: [id number] DOB:11-07-1940, 82 y.o., female Today's Date: 05/22/2022  END OF SESSION:   PT End of Session - 05/22/22 1149     Visit Number 27    Number of Visits 71    Date for PT Re-Evaluation 07/14/22    Authorization Type Medicare A&B Primary    Progress Note Due on Visit 30    PT Start Time 1147    PT Stop Time 1228    PT Time Calculation (min) 41 min    Equipment Utilized During Treatment Gait belt    Activity Tolerance Patient tolerated treatment well;No increased pain    Behavior During Therapy WFL for tasks assessed/performed                     Past Medical History:  Diagnosis Date   Arthritis    Complication of anesthesia    nausea   Dyspnea    with exertion   Family history of adverse reaction to anesthesia    PONV- MOM   Hypertension    Hypothyroidism    PONV (postoperative nausea and vomiting)    Sleep apnea    uses CPAP nightly   Stress incontinence    Valvular insufficiency, mitral    Past Surgical History:  Procedure Laterality Date   ANKLE ARTHROSCOPY WITH ARTHRODESIS Left 12/05/2021   Procedure: REPAIR OF TIBIAL NONUNION WITH AUTOGRAFT, TIBIOTALAR ARTHRODESIS;  Surgeon: Erle Crocker, MD;  Location: [facility];  Service: Orthopedics;  Laterality: Left;   APPLICATION OF WOUND VAC Right 01/04/2021   Procedure: APPLICATION OF WOUND VAC;  Surgeon: Altamese [place], MD;  Location: [facility];  Service: Orthopedics;  Laterality: Right;   BREAST CYST ASPIRATION Bilateral    neg   BREAST EXCISIONAL BIOPSY Right    1970's   CATARACT EXTRACTION W/ INTRAOCULAR LENS  IMPLANT, BILATERAL Bilateral    COLONOSCOPY     DILATION AND CURETTAGE OF UTERUS     HARDWARE REMOVAL Left 12/05/2021   Procedure: LEFT ANKLE DEEP HARDWARE REMOVAL, MEDIAL AND LATERAL;  Surgeon: Erle Crocker, MD;  Location: [facility];  Service: Orthopedics;  Laterality: Left;  LENGTH OF SURGERY:  150 MINUTES   I & D EXTREMITY Left 01/04/2021   Procedure: IRRIGATION AND DEBRIDEMENT LEFT ANKLE;  Surgeon: Altamese [place], MD;  Location: [facility];  Service: Orthopedics;  Laterality: Left;   I & D EXTREMITY Left 01/07/2021   Procedure: IRRIGATION AND DEBRIDEMENT EXTREMITY, Left ankle;  Surgeon: Altamese [place], MD;  Location: [facility];  Service: Orthopedics;  Laterality: Left;   KNEE ARTHROPLASTY Left 11/24/2016   Procedure: COMPUTER ASSISTED TOTAL KNEE ARTHROPLASTY;  Surgeon: Dereck Leep, MD;  Location: [facility];  Service: Orthopedics;  Laterality: Left;   KNEE ARTHROPLASTY Right 12/31/2020   Procedure: COMPUTER ASSISTED TOTAL KNEE ARTHROPLASTY;  Surgeon: Dereck Leep, MD;  Location: [facility];  Service: Orthopedics;  Laterality: Right;   KNEE ARTHROSCOPY Left    KNEE ARTHROSCOPY Left 07/18/2015   Procedure: ARTHROSCOPY KNEE WITH MEDIAL COMPARTMENTAL MENICUS CHONDROPLASTY;  Surgeon: Dereck Leep, MD;  Location: [facility];  Service: Orthopedics;  Laterality: Left;   KNEE ARTHROSCOPY WITH LATERAL MENISECTOMY Left 07/18/2015   Procedure: LEFT KNEE ARTHROSCOPY WITH PARTIAL LATERAL MENISECTOMY GRADE 4 CHONDROMYLASIA LATERAL TIBIAL PLATEAU;  Surgeon: Dereck Leep, MD;  Location: [facility];  Service: Orthopedics;  Laterality: Left;   ORIF ANKLE FRACTURE Left 01/04/2021   Procedure: OPEN REDUCTION INTERNAL  FIXATION (ORIF) ANKLE FRACTURE;  Surgeon: Altamese [place], MD;  Location: [place];  Service: Orthopedics;  Laterality: Left;   ORIF ANKLE FRACTURE Left 01/07/2021   Procedure: SCREW REMOVAL ANKLE REMOVAL OF WOUND VAC LEFT LEG;  Surgeon: Altamese [place], MD;  Location: [place];  Service: Orthopedics;  Laterality: Left;   Patient Active Problem List   Diagnosis Date Noted   Arthritis of left ankle 12/05/2021   Sleep disturbance    Acute blood loss anemia 01/18/2021   Hypokalemia 01/18/2021   Constipation 01/18/2021   Trauma 01/08/2021   Open left ankle fracture, sequela 01/03/2021   Essential  hypertension 01/03/2021   Hypothyroidism 01/03/2021   Total knee replacement status 12/31/2020   Family history of heart disease 04/12/2018   History of total knee arthroplasty, left 11/24/2016   Adult idiopathic generalized osteoporosis 03/01/2015   OSA on CPAP 01/30/2014    PCP: Emily Filbert, MD  REFERRING PROVIDER: Erle Crocker, MD  REFERRING DIAG: Left tibiotalar arthrodesis with repair of tibial nonunion and hardware removal, 12/06/21  THERAPY DIAG:  Pain in left ankle and joints of left foot  Abnormality of gait and mobility  Stiffness of left ankle, not elsewhere classified  Difficulty in walking, not elsewhere classified  Rationale for Evaluation and Treatment: Rehabilitation  ONSET DATE: 12/06/21   SUBJECTIVE:   SUBJECTIVE STATEMENT:  Pt reports mild pain 2/10 in the anterior and lateral L ankle.  States she feels her mobility is continuing to improve but her pain levels have not improved much recently.  Physical therapist encourage patient that improvements in function are just as important as improvements in pain in regards to her full recovery.    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 PMH includes HTN, valvular insufficiency, SUI.    PAIN:  Are you having pain? Yes: NPRS scale: 2/10 Pain location: more in the ankle Pain description: soreness Aggravating factors: Being on the knee scooter is more problematic on the surgically fixed knee.  PRECAUTIONS: Fall  WEIGHT BEARING RESTRICTIONS: Other: Now Weight bearing as tolerated  no Boot or brace requeired  FALLS:  Has patient fallen in last 6 months? No  LIVING ENVIRONMENT: Lives with: lives with their spouse Lives in: House/apartment Stairs: Yes: Internal: 15 steps; on left going up and External: 4 steps;  bilateral but cannot reach both Has following equipment at home: Single point cane, [place] - 2 wheeled, Environmental consultant - 4 wheeled, Crutches, Electronics engineer, bed side commode, and Knee Scooter  OCCUPATION: Retired  PLOF: Independent  PATIENT GOALS: Pt wants to be able to go up/down the flight of steps.  NEXT MD VISIT:   OBJECTIVE:   DIAGNOSTIC FINDINGS:   CLINICAL DATA:  Fluoroscopic assistance for revision of internal fixation in left ankle   EXAM: LEFT ANKLE COMPLETE - 3+ VIEW   COMPARISON:  CT done on 09/24/2021   FINDINGS: There is interval placement of a new longer screw transfixing the medial malleolus. There is a new metallic plate along the anterior margin of distal tibia anchored to tibia and talus. Fluoroscopic time 1 minute and 11 seconds. Radiation dose 1.46 mGy.   IMPRESSION: Fluoroscopic assistance was provided for revision of internal fixation in left ankle.  PATIENT SURVEYS:  FOTO 40/50  COGNITION: Overall cognitive status: Within functional limits for tasks assessed     SENSATION:  WFL  PALPATION: No tenderness surrounding the ankle  LOWER EXTREMITY ROM:  Active ROM Right eval Left eval  Ankle dorsiflexion  2 deg  Ankle plantarflexion  13 deg  Ankle inversion  7 deg  Ankle eversion  3 deg   (Blank rows = not tested)   LOWER EXTREMITY SPECIAL TESTS: at eavl  Ankle special tests: Not performed at this time   FUNCTIONAL TESTS:   5 times sit to stand: 19.41 sec Timed up and go (TUG): 18.25 sec 10 meter walk test: 18.54 sec Dynamic Gait Index: Not performed due to weightbearing restrictions.   GAIT: At EVAL: Distance walked: Not performed due to weightbearing restrictions.   ODAY'S TREATMENT: DATE: 05/08/22     Therex:  Ankle PF/DF AROM x 15 on wobble board on seated  Ankle ER/IR AROM x 15 AAROM into end range DF 10 x 10 sec holds at end range on wobble board  LAQ x 10 GTB Hip abduction GTB x 12 Hip flexion GTB x 12  BAPS board A/P  rocks and M/L rocks x 1 min eal on Level 2  Standing with 3 pound ankle weights done performing hip abduction x 10 reps each side and hip extension x 10 reps to each side with cues for appropriate gluteal muscle activation. Standing step up with appropriate cues for hip hinge and gluteal and hamstring muscle activation.  Increased difficulty performing on the left compared to the right this date  Note: Portions of this document were prepared using Dragon voice recognition software and although reviewed may contain unintentional dictation errors in syntax, grammar, or spelling.   PATIENT EDUCATION:  Education details: Pt educated throughout session about proper posture and technique with exercises. Improved exercise technique, movement at target joints, use of target muscles after min to mod verbal, visual, tactile cues.    Person educated: Patient and Spouse Education method: Explanation Education comprehension: verbalized understanding  HOME EXERCISE PROGRAM:  Access Code: [email or web address] URL: [email or web address] Date: 01/27/2022 Prepared by: [place]  - 1 x daily - 7 x weekly - 3 sets - 10 reps - Seated Heel Raise  - 1 x daily - 7 x weekly - 3 sets - 10 reps - Seated Long Arc Quad  - 1 x daily - 7 x weekly - 3 sets - 10 reps - Ankle Inversion Eversion Towel Slide  - 1 x daily - 7 x weekly - 3 sets - 10 reps - Arch Lifting  - 1 x daily - 7 x weekly - 3 sets - 10 reps   ASSESSMENT:  CLINICAL IMPRESSION:  Patient presents with good motivation for completion of physical therapy activities.  Patient produces several new activities and I will challenges for left ankle as well as the left and right lower extremities in terms of improving strength and balance.  Patient continues to respond well to interventions but does continue to have pain in the left ankle which is not exacerbated by physical therapy per her report.  Patient also continues to  lack appropriate range of motion when this may be normal given the nature of her surgical procedure.Pt will continue to benefit from skilled physical therapy intervention to address impairments, improve QOL, and attain therapy goals.         OBJECTIVE IMPAIRMENTS: Abnormal gait, decreased balance, decreased mobility, difficulty walking, decreased ROM, decreased strength, hypomobility, impaired flexibility, and pain.   ACTIVITY LIMITATIONS: standing, squatting, stairs, transfers, bed mobility, bathing, dressing, and locomotion level  PARTICIPATION LIMITATIONS: meal prep, cleaning, laundry, driving, shopping, community activity, yard work, and church  PERSONAL FACTORS: Age, Past/current experiences, Time since onset of injury/illness/exacerbation, and 3+ comorbidities: arthritis, HTN, generalized osteoporosis  are also affecting patient's functional outcome.   REHAB POTENTIAL: Good  CLINICAL DECISION MAKING: Stable/uncomplicated  EVALUATION COMPLEXITY: Moderate   GOALS: Goals reviewed with patient? Yes  SHORT TERM GOALS: Target date: 02/24/2022    Pt will be independent with HEP in order to demonstrate increased ability to perform tasks related to occupation/hobbies. Baseline: Pt given HEP at initial evaluation; 03/05/2022- Patient verbalized compliance with HEP and able to return demonstration of ankle ROM and LE strenghthening. Review general LE strengthening with patient and husband. Goal status: MET   LONG TERM GOALS: Target date: 07/14/2022  1.  Patient (> 67 years old) will complete five times sit to stand test in < 15 seconds indicating an increased LE strength and improved balance. Baseline: Not performed due to weightbearing restrictions. 03/05/2022- Deferred - waiting to discuss any restrictions with MD 2/12: 16.2 sec; 04/21/2022= 11 sec Goal status: MET  2.  Patient will increase FOTO score to equal to or greater than  50   to demonstrate statistically significant  improvement in mobility and quality of life.  Baseline: FOTO: 40 2/12: 47; 04/21/2022= 50 Goal status: PROGRESSING   3.  Patient will increase Berg Balance score by > 6 points to demonstrate decreased fall risk during functional activities. Baseline: Not performed due to time constraints. 2/12: 48/56; did not assess due to time constraints Goal status: ONGOING   4.  Patient will reduce timed up and go to <11 seconds to reduce fall risk and demonstrate improved transfer/gait ability. Baseline: 11.39 sec with walker 2/12:16.1 sec with SPC; 04/21/2022= 11.69 sec with use of SPC  Goal status: Progressing  5.  Patient will increase 10 meter walk test to >1.42m/s as to improve gait speed for better community ambulation and to reduce fall risk. Baseline: 18.59 sec - 0.54 m/s with walker 2/12: .52 m/s with SPC; 04/21/2022= 0.58 m/s with increased VC to take longer steps.  Goal status: Progressing   6.  Patient will increase six minute walk test distance to >1000 for progression to community ambulator and improve gait ability Baseline: Not performed at this time and will perform at later date. 2/12: 500 feet with no brace and with SPC some soreness in the  ankle; 04/21/2022= 535 feet with use of SPC Goal status: Progressing.    7. Patient will report ability to return to performing more functional activities including cleaning tub/toilet and vacuuming without LOB or significant difficulty.  Baseline: 04/21/2022-Patient reports she has significant difficulty with cleaning toilet and tub including vacuuming.  Goal status: INITIAL   PLAN:  PT FREQUENCY: 1-2x/week  PT DURATION: 12 weeks  PLANNED INTERVENTIONS: Therapeutic exercises, Therapeutic activity, Neuromuscular re-education, Balance training, Gait training, Patient/Family education, Self Care, Joint mobilization, Stair training, DME instructions, Aquatic Therapy, Dry Needling, Electrical stimulation, Cryotherapy, Moist heat, scar mobilization,  Ultrasound, Parrafin, Fluidotherapy, and Manual therapy  PLAN FOR NEXT SESSION:   Ankle stability and balance training. Continue strengthening and ROM program. Continue with manual therapy for ROM and pain relief.   [place] ,DPT Physical Therapist- [facility]   11:50 AM 05/22/22

## 2022-05-28 ENCOUNTER — Ambulatory Visit: Payer: Medicare Other | Admitting: Physical Therapy

## 2022-05-29 ENCOUNTER — Ambulatory Visit: Payer: Medicare Other

## 2022-05-29 DIAGNOSIS — M25672 Stiffness of left ankle, not elsewhere classified: Secondary | ICD-10-CM

## 2022-05-29 DIAGNOSIS — R262 Difficulty in walking, not elsewhere classified: Secondary | ICD-10-CM

## 2022-05-29 DIAGNOSIS — M25572 Pain in left ankle and joints of left foot: Secondary | ICD-10-CM | POA: Diagnosis not present

## 2022-05-29 DIAGNOSIS — M6281 Muscle weakness (generalized): Secondary | ICD-10-CM

## 2022-05-29 DIAGNOSIS — R269 Unspecified abnormalities of gait and mobility: Secondary | ICD-10-CM

## 2022-05-29 NOTE — Therapy (Signed)
OUTPATIENT PHYSICAL THERAPY LOWER EXTREMITY TREATMENT   Patient Name: Hannah Tucker MRN: [id number] DOB:[birth year], 82 y.o., female Today's Date: 05/29/2022  END OF SESSION:   PT End of Session - 05/29/22 1444     Visit Number 28    Number of Visits 39    Date for PT Re-Evaluation 07/14/22    Authorization Type Medicare A&B Primary    Progress Note Due on Visit 97    PT Start Time 1444    Equipment Utilized During Treatment Gait belt    Activity Tolerance Patient tolerated treatment well;No increased pain    Behavior During Therapy WFL for tasks assessed/performed                     Past Medical History:  Diagnosis Date   Arthritis    Complication of anesthesia    nausea   Dyspnea    with exertion   Family history of adverse reaction to anesthesia    PONV- MOM   Hypertension    Hypothyroidism    PONV (postoperative nausea and vomiting)    Sleep apnea    uses CPAP nightly   Stress incontinence    Valvular insufficiency, mitral    Past Surgical History:  Procedure Laterality Date   ANKLE ARTHROSCOPY WITH ARTHRODESIS Left 12/05/2021   Procedure: REPAIR OF TIBIAL NONUNION WITH AUTOGRAFT, TIBIOTALAR ARTHRODESIS;  Surgeon: Erle Crocker, MD;  Location: [facility];  Service: Orthopedics;  Laterality: Left;   APPLICATION OF WOUND VAC Right 01/04/2021   Procedure: APPLICATION OF WOUND VAC;  Surgeon: Altamese Matthews, MD;  Location: [place];  Service: Orthopedics;  Laterality: Right;   BREAST CYST ASPIRATION Bilateral    neg   BREAST EXCISIONAL BIOPSY Right    1970's   CATARACT EXTRACTION W/ INTRAOCULAR LENS  IMPLANT, BILATERAL Bilateral    COLONOSCOPY     DILATION AND CURETTAGE OF UTERUS     HARDWARE REMOVAL Left 12/05/2021   Procedure: LEFT ANKLE DEEP HARDWARE REMOVAL, MEDIAL AND LATERAL;  Surgeon: Erle Crocker, MD;  Location: [facility];  Service: Orthopedics;  Laterality: Left;  LENGTH OF SURGERY: 150 MINUTES   I & D EXTREMITY Left 01/04/2021   Procedure:  IRRIGATION AND DEBRIDEMENT LEFT ANKLE;  Surgeon: Altamese Entiat, MD;  Location: [place];  Service: Orthopedics;  Laterality: Left;   I & D EXTREMITY Left 01/07/2021   Procedure: IRRIGATION AND DEBRIDEMENT EXTREMITY, Left ankle;  Surgeon: Altamese Wheaton, MD;  Location: [place];  Service: Orthopedics;  Laterality: Left;   KNEE ARTHROPLASTY Left 11/24/2016   Procedure: COMPUTER ASSISTED TOTAL KNEE ARTHROPLASTY;  Surgeon: Dereck Leep, MD;  Location: [facility];  Service: Orthopedics;  Laterality: Left;   KNEE ARTHROPLASTY Right 12/31/2020   Procedure: COMPUTER ASSISTED TOTAL KNEE ARTHROPLASTY;  Surgeon: Dereck Leep, MD;  Location: [facility];  Service: Orthopedics;  Laterality: Right;   KNEE ARTHROSCOPY Left    KNEE ARTHROSCOPY Left 07/18/2015   Procedure: ARTHROSCOPY KNEE WITH MEDIAL COMPARTMENTAL MENICUS CHONDROPLASTY;  Surgeon: Dereck Leep, MD;  Location: [facility];  Service: Orthopedics;  Laterality: Left;   KNEE ARTHROSCOPY WITH LATERAL MENISECTOMY Left 07/18/2015   Procedure: LEFT KNEE ARTHROSCOPY WITH PARTIAL LATERAL MENISECTOMY GRADE 4 CHONDROMYLASIA LATERAL TIBIAL PLATEAU;  Surgeon: Dereck Leep, MD;  Location: [facility];  Service: Orthopedics;  Laterality: Left;   ORIF ANKLE FRACTURE Left 01/04/2021   Procedure: OPEN REDUCTION INTERNAL FIXATION (ORIF) ANKLE FRACTURE;  Surgeon: Altamese Fontanet, MD;  Location: [place];  Service: Orthopedics;  Laterality: Left;   ORIF ANKLE FRACTURE Left 01/07/2021   Procedure: SCREW REMOVAL ANKLE REMOVAL OF WOUND VAC LEFT LEG;  Surgeon: Altamese Sherburn, MD;  Location: [place];  Service: Orthopedics;  Laterality: Left;   Patient Active Problem List   Diagnosis Date Noted   Arthritis of left ankle 12/05/2021   Sleep disturbance    Acute blood loss anemia 01/18/2021   Hypokalemia 01/18/2021   Constipation 01/18/2021   Trauma 01/08/2021   Open left ankle fracture, sequela 01/03/2021   Essential hypertension 01/03/2021   Hypothyroidism 01/03/2021   Total knee  replacement status 12/31/2020   Family history of heart disease 04/12/2018   History of total knee arthroplasty, left 11/24/2016   Adult idiopathic generalized osteoporosis 03/01/2015   OSA on CPAP 01/30/2014    PCP: Emily Filbert, MD  REFERRING PROVIDER: Erle Crocker, MD  REFERRING DIAG: Left tibiotalar arthrodesis with repair of tibial nonunion and hardware removal, 12/06/21  THERAPY DIAG:  Pain in left ankle and joints of left foot  Abnormality of gait and mobility  Stiffness of left ankle, not elsewhere classified  Difficulty in walking, not elsewhere classified  Muscle weakness (generalized)  Rationale for Evaluation and Treatment: Rehabilitation  ONSET DATE: 12/06/21   SUBJECTIVE:   SUBJECTIVE STATEMENT: Patient reports doing not too bad these days. Reports has f/u with MD on 06/11/2022.     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 PMH includes HTN, valvular insufficiency, SUI.    PAIN:  Are you having pain? Yes: NPRS scale: 1/10 Pain location: more in the ankle Pain description: soreness Aggravating factors: Being on the knee scooter is more problematic on the surgically fixed knee.  PRECAUTIONS: Fall  WEIGHT BEARING RESTRICTIONS: Other: Now Weight bearing as tolerated  no Boot or brace requeired  FALLS:  Has patient fallen in last 6 months? No  LIVING ENVIRONMENT: Lives with: lives with their spouse Lives in: House/apartment Stairs: Yes: Internal: 15 steps; on left going up and External: 4 steps; bilateral but cannot reach both Has following equipment at home: Single point cane, Walker - 2 wheeled, Environmental consultant - 4 wheeled, Crutches, Electronics engineer, bed side commode, and Knee Scooter  OCCUPATION: Retired  PLOF: Independent  PATIENT GOALS: Pt wants to be able to go  up/down the flight of steps.  NEXT MD VISIT:   OBJECTIVE:   DIAGNOSTIC FINDINGS:   CLINICAL DATA:  Fluoroscopic assistance for revision of internal fixation in left ankle   EXAM: LEFT ANKLE COMPLETE - 3+ VIEW   COMPARISON:  CT done on 09/24/2021   FINDINGS: There is interval placement of a new longer screw transfixing the medial malleolus. There is a new metallic plate along the anterior margin of distal tibia anchored to tibia and talus. Fluoroscopic time 1 minute and 11 seconds. Radiation dose 1.46 mGy.   IMPRESSION: Fluoroscopic assistance was provided for revision of internal fixation in left ankle.  PATIENT SURVEYS:  FOTO 40/50  COGNITION: Overall cognitive status: Within functional limits for tasks assessed     SENSATION: WFL  PALPATION: No tenderness surrounding the ankle  LOWER EXTREMITY ROM:  Active ROM Right eval Left eval  Ankle dorsiflexion  2 deg  Ankle plantarflexion  13 deg  Ankle inversion  7 deg  Ankle eversion  3 deg   (Blank rows = not tested)  LOWER EXTREMITY SPECIAL TESTS: at eavl  Ankle special tests: Not performed at this time   FUNCTIONAL TESTS:   5 times sit to stand: 19.41 sec Timed up and go (TUG): 18.25 sec 10 meter walk test: 18.54 sec Dynamic Gait Index: Not performed due to weightbearing restrictions.   GAIT: At EVAL: Distance walked: Not performed due to weightbearing restrictions.  TODAY'S TREATMENT: DATE: 05/29/22     Therex:  Ankle PF/DF AROM  2 sets x 15 on 1/2 foam (curve side up) Ankle PF/DF AROM 2 sets x 10 on 1/2 foam (Curve side down)   Ankle ER/IR AROM on 1/2 foam (cuve side down) 2 sets x 15 BAPS board A/P rocks and M/L rocks x 20 reps x 2 sets each on Level 2  Standing stretch at steps- static stand on incline board- hold 30 sec progressed to placing right foot onto step to add progressive stretch to left LE - x 30 sec x 2.  Lateral small rocker board x 20 reps  A/P small rocker board x 20  reps  Note: Portions of this document were prepared using Dragon voice recognition software and although reviewed may contain unintentional dictation errors in syntax, grammar, or spelling.   PATIENT EDUCATION:  Education details: Pt educated throughout session about proper posture and technique with exercises. Improved exercise technique, movement at target joints, use of target muscles after min to mod verbal, visual, tactile cues.    Person educated: Patient and Spouse Education method: Explanation Education comprehension: verbalized understanding  HOME EXERCISE PROGRAM:  Access Code: [email or web address] URL: [email or web address] Date: 01/27/2022 Prepared by: [place]  - 1 x daily - 7 x weekly - 3 sets - 10 reps - Seated Heel Raise  - 1 x daily - 7 x weekly - 3 sets - 10 reps - Seated Long Arc Quad  - 1 x daily - 7 x weekly - 3 sets - 10 reps - Ankle Inversion Eversion Towel Slide  - 1 x daily - 7 x weekly - 3 sets - 10 reps - Arch Lifting  - 1 x daily - 7 x weekly - 3 sets - 10 reps   ASSESSMENT:  CLINICAL IMPRESSION:  Treatment limited today due to late arrival but patient arrived in good spirits and participated well- improved performance using BAPS with very minimal cues to avoid using knee/hip musculature to assist with ankle mobility. She was also able to use rocker board and incline today without report of any pain. Pt will continue to benefit from skilled physical therapy intervention to address impairments, improve QOL, and attain therapy goals.      OBJECTIVE IMPAIRMENTS: Abnormal gait, decreased balance, decreased mobility, difficulty walking, decreased ROM, decreased strength, hypomobility, impaired flexibility, and pain.   ACTIVITY LIMITATIONS: standing, squatting, stairs, transfers, bed mobility, bathing, dressing, and locomotion level  PARTICIPATION LIMITATIONS: meal prep, cleaning, laundry, driving, shopping, community  activity, yard work, and church  PERSONAL FACTORS: Age, Past/current experiences, Time since onset of injury/illness/exacerbation, and 3+ comorbidities: arthritis, HTN, generalized osteoporosis  are also affecting patient's functional outcome.   REHAB POTENTIAL: Good  CLINICAL DECISION MAKING: Stable/uncomplicated  EVALUATION COMPLEXITY: Moderate   GOALS: Goals reviewed with patient? Yes  SHORT TERM GOALS: Target date: 02/24/2022    Pt will be independent with HEP in order to demonstrate increased ability to perform tasks related to occupation/hobbies. Baseline: Pt given HEP at initial evaluation; 03/05/2022- Patient verbalized compliance with HEP and able to return demonstration of  ankle ROM and LE strenghthening. Review general LE strengthening with patient and husband. Goal status: MET   LONG TERM GOALS: Target date: 07/14/2022  1.  Patient (> 19 years old) will complete five times sit to stand test in < 15 seconds indicating an increased LE strength and improved balance. Baseline: Not performed due to weightbearing restrictions. 03/05/2022- Deferred - waiting to discuss any restrictions with MD 2/12: 16.2 sec; 04/21/2022= 11 sec Goal status: MET  2.  Patient will increase FOTO score to equal to or greater than  50   to demonstrate statistically significant improvement in mobility and quality of life.  Baseline: FOTO: 40 2/12: 47; 04/21/2022= 50 Goal status: PROGRESSING   3.  Patient will increase Berg Balance score by > 6 points to demonstrate decreased fall risk during functional activities. Baseline: Not performed due to time constraints. 2/12: 48/56; did not assess due to time constraints Goal status: ONGOING   4.  Patient will reduce timed up and go to <11 seconds to reduce fall risk and demonstrate improved transfer/gait ability. Baseline: 11.39 sec with walker 2/12:16.1 sec with SPC; 04/21/2022= 11.69 sec with use of SPC  Goal status: Progressing  5.  Patient will increase  10 meter walk test to >1.58m/s as to improve gait speed for better community ambulation and to reduce fall risk. Baseline: 18.59 sec - 0.54 m/s with walker 2/12: .52 m/s with SPC; 04/21/2022= 0.58 m/s with increased VC to take longer steps.  Goal status: Progressing   6.  Patient will increase six minute walk test distance to >1000 for progression to community ambulator and improve gait ability Baseline: Not performed at this time and will perform at later date. 2/12: 500 feet with no brace and with SPC some soreness in the  ankle; 04/21/2022= 535 feet with use of SPC Goal status: Progressing.    7. Patient will report ability to return to performing more functional activities including cleaning tub/toilet and vacuuming without LOB or significant difficulty.  Baseline: 04/21/2022-Patient reports she has significant difficulty with cleaning toilet and tub including vacuuming.  Goal status: INITIAL   PLAN:  PT FREQUENCY: 1-2x/week  PT DURATION: 12 weeks  PLANNED INTERVENTIONS: Therapeutic exercises, Therapeutic activity, Neuromuscular re-education, Balance training, Gait training, Patient/Family education, Self Care, Joint mobilization, Stair training, DME instructions, Aquatic Therapy, Dry Needling, Electrical stimulation, Cryotherapy, Moist heat, scar mobilization, Ultrasound, Parrafin, Fluidotherapy, and Manual therapy  PLAN FOR NEXT SESSION:   Ankle stability and balance training. Continue strengthening and ROM program. Continue with manual therapy for ROM and pain relief.   Lewis Moccasin PT  Physical Therapist- [facility]   2:46 PM 05/29/22

## 2022-06-04 ENCOUNTER — Ambulatory Visit: Payer: Medicare Other | Attending: Orthopaedic Surgery

## 2022-06-04 DIAGNOSIS — R262 Difficulty in walking, not elsewhere classified: Secondary | ICD-10-CM

## 2022-06-04 DIAGNOSIS — M25572 Pain in left ankle and joints of left foot: Secondary | ICD-10-CM | POA: Insufficient documentation

## 2022-06-04 DIAGNOSIS — M25672 Stiffness of left ankle, not elsewhere classified: Secondary | ICD-10-CM | POA: Insufficient documentation

## 2022-06-04 DIAGNOSIS — R269 Unspecified abnormalities of gait and mobility: Secondary | ICD-10-CM | POA: Diagnosis present

## 2022-06-04 DIAGNOSIS — M6281 Muscle weakness (generalized): Secondary | ICD-10-CM | POA: Insufficient documentation

## 2022-06-04 DIAGNOSIS — R52 Pain, unspecified: Secondary | ICD-10-CM | POA: Insufficient documentation

## 2022-06-04 NOTE — Therapy (Signed)
OUTPATIENT PHYSICAL THERAPY LOWER EXTREMITY TREATMENT   Patient Name: Hannah Tucker MRN: [id number] DOB:06-30-1940, 82 y.o., female Today's Date: 06/05/2022  END OF SESSION:   PT End of Session - 06/04/22 1434     Visit Number 29    Number of Visits 43    Date for PT Re-Evaluation 07/14/22    Authorization Type Medicare A&B Primary    Progress Note Due on Visit 5    PT Start Time 1433    PT Stop Time 1513    PT Time Calculation (min) 40 min    Equipment Utilized During Treatment Gait belt    Activity Tolerance Patient tolerated treatment well;No increased pain    Behavior During Therapy WFL for tasks assessed/performed                      Past Medical History:  Diagnosis Date   Arthritis    Complication of anesthesia    nausea   Dyspnea    with exertion   Family history of adverse reaction to anesthesia    PONV- MOM   Hypertension    Hypothyroidism    PONV (postoperative nausea and vomiting)    Sleep apnea    uses CPAP nightly   Stress incontinence    Valvular insufficiency, mitral    Past Surgical History:  Procedure Laterality Date   ANKLE ARTHROSCOPY WITH ARTHRODESIS Left 12/05/2021   Procedure: REPAIR OF TIBIAL NONUNION WITH AUTOGRAFT, TIBIOTALAR ARTHRODESIS;  Surgeon: Erle Crocker, MD;  Location: [place];  Service: Orthopedics;  Laterality: Left;   APPLICATION OF WOUND VAC Right 01/04/2021   Procedure: APPLICATION OF WOUND VAC;  Surgeon: Altamese Pecan Grove, MD;  Location: [place];  Service: Orthopedics;  Laterality: Right;   BREAST CYST ASPIRATION Bilateral    neg   BREAST EXCISIONAL BIOPSY Right    1970's   CATARACT EXTRACTION W/ INTRAOCULAR LENS  IMPLANT, BILATERAL Bilateral    COLONOSCOPY     DILATION AND CURETTAGE OF UTERUS     HARDWARE REMOVAL Left 12/05/2021   Procedure: LEFT ANKLE DEEP HARDWARE REMOVAL, MEDIAL AND LATERAL;  Surgeon: Erle Crocker, MD;  Location: [place];  Service: Orthopedics;  Laterality: Left;  LENGTH OF SURGERY:  150 MINUTES   I & D EXTREMITY Left 01/04/2021   Procedure: IRRIGATION AND DEBRIDEMENT LEFT ANKLE;  Surgeon: Altamese Wishram, MD;  Location: [place];  Service: Orthopedics;  Laterality: Left;   I & D EXTREMITY Left 01/07/2021   Procedure: IRRIGATION AND DEBRIDEMENT EXTREMITY, Left ankle;  Surgeon: Altamese Taylor, MD;  Location: [place];  Service: Orthopedics;  Laterality: Left;   KNEE ARTHROPLASTY Left 11/24/2016   Procedure: COMPUTER ASSISTED TOTAL KNEE ARTHROPLASTY;  Surgeon: Dereck Leep, MD;  Location: [facility];  Service: Orthopedics;  Laterality: Left;   KNEE ARTHROPLASTY Right 12/31/2020   Procedure: COMPUTER ASSISTED TOTAL KNEE ARTHROPLASTY;  Surgeon: Dereck Leep, MD;  Location: [facility];  Service: Orthopedics;  Laterality: Right;   KNEE ARTHROSCOPY Left    KNEE ARTHROSCOPY Left 07/18/2015   Procedure: ARTHROSCOPY KNEE WITH MEDIAL COMPARTMENTAL MENICUS CHONDROPLASTY;  Surgeon: Dereck Leep, MD;  Location: [facility];  Service: Orthopedics;  Laterality: Left;   KNEE ARTHROSCOPY WITH LATERAL MENISECTOMY Left 07/18/2015   Procedure: LEFT KNEE ARTHROSCOPY WITH PARTIAL LATERAL MENISECTOMY GRADE 4 CHONDROMYLASIA LATERAL TIBIAL PLATEAU;  Surgeon: Dereck Leep, MD;  Location: [facility];  Service: Orthopedics;  Laterality: Left;   ORIF ANKLE FRACTURE Left 01/04/2021   Procedure: OPEN REDUCTION  INTERNAL FIXATION (ORIF) ANKLE FRACTURE;  Surgeon: Altamese Bluewater, MD;  Location: [place];  Service: Orthopedics;  Laterality: Left;   ORIF ANKLE FRACTURE Left 01/07/2021   Procedure: SCREW REMOVAL ANKLE REMOVAL OF WOUND VAC LEFT LEG;  Surgeon: Altamese , MD;  Location: [place];  Service: Orthopedics;  Laterality: Left;   Patient Active Problem List   Diagnosis Date Noted   Arthritis of left ankle 12/05/2021   Sleep disturbance    Acute blood loss anemia 01/18/2021   Hypokalemia 01/18/2021   Constipation 01/18/2021   Trauma 01/08/2021   Open left ankle fracture, sequela 01/03/2021   Essential  hypertension 01/03/2021   Hypothyroidism 01/03/2021   Total knee replacement status 12/31/2020   Family history of heart disease 04/12/2018   History of total knee arthroplasty, left 11/24/2016   Adult idiopathic generalized osteoporosis 03/01/2015   OSA on CPAP 01/30/2014    PCP: Emily Filbert, MD  REFERRING PROVIDER: Erle Crocker, MD  REFERRING DIAG: Left tibiotalar arthrodesis with repair of tibial nonunion and hardware removal, 12/06/21  THERAPY DIAG:  Pain in left ankle and joints of left foot  Abnormality of gait and mobility  Stiffness of left ankle, not elsewhere classified  Difficulty in walking, not elsewhere classified  Muscle weakness (generalized)  Rationale for Evaluation and Treatment: Rehabilitation  ONSET DATE: 12/06/21   SUBJECTIVE:   SUBJECTIVE STATEMENT: Patient reports left heel pain is a little worse and states she was on her feet more over the holiday weekend- did over 5k steps- some with the cane and some without the cane.  Patient reports doing not too bad these days. Reports has f/u with MD on 06/11/2022.     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 PMH includes HTN, valvular insufficiency, SUI.    PAIN:  Are you having pain? Yes: NPRS scale: 1/10 Pain location: more in the ankle Pain description: soreness Aggravating factors: Being on the knee scooter is more problematic on the surgically fixed knee.  PRECAUTIONS: Fall  WEIGHT BEARING RESTRICTIONS: Other: Now Weight bearing as tolerated  no Boot or brace requeired  FALLS:  Has patient fallen in last 6 months? No  LIVING ENVIRONMENT: Lives with: lives with their spouse Lives in: House/apartment Stairs: Yes: Internal: 15 steps; on left going up and External: 4 steps; bilateral but cannot reach  both Has following equipment at home: Single point cane, Walker - 2 wheeled, Environmental consultant - 4 wheeled, Crutches, Electronics engineer, bed side commode, and Knee Scooter  OCCUPATION: Retired  PLOF: Independent  PATIENT GOALS: Pt wants to be able to go up/down the flight of steps.  NEXT MD VISIT:   OBJECTIVE:   DIAGNOSTIC FINDINGS:   CLINICAL DATA:  Fluoroscopic assistance for revision of internal fixation in left ankle   EXAM: LEFT ANKLE COMPLETE - 3+ VIEW   COMPARISON:  CT done on 09/24/2021   FINDINGS: There is interval placement of a new longer screw transfixing the medial malleolus. There is a new metallic plate along the anterior margin of distal tibia anchored to tibia and talus. Fluoroscopic time 1 minute and 11 seconds. Radiation dose 1.46 mGy.   IMPRESSION: Fluoroscopic assistance was provided for revision of internal fixation in left ankle.  PATIENT SURVEYS:  FOTO 40/50  COGNITION: Overall cognitive status: Within functional limits for tasks assessed  SENSATION: WFL  PALPATION: No tenderness surrounding the ankle  LOWER EXTREMITY ROM:  Active ROM Right eval Left eval  Ankle dorsiflexion  2 deg  Ankle plantarflexion  13 deg  Ankle inversion  7 deg  Ankle eversion  3 deg   (Blank rows = not tested)   LOWER EXTREMITY SPECIAL TESTS: at eavl  Ankle special tests: Not performed at this time   FUNCTIONAL TESTS:   5 times sit to stand: 19.41 sec Timed up and go (TUG): 18.25 sec 10 meter walk test: 18.54 sec Dynamic Gait Index: Not performed due to weightbearing restrictions.   GAIT: At EVAL: Distance walked: Not performed due to weightbearing restrictions.  TODAY'S TREATMENT: DATE: 04/3//24   Manual Therapy: manual stretching into Ankle DF/PF x 5 min  Therex:  Ankle PF/DF AROM  2 sets x 15 on 1/2 foam (curve side up) Ankle PF/DF AROM 2 sets x 10 on 1/2 foam (Curve side down)   Ankle ER/IR AROM on 1/2 foam (cuve side down) 2 sets x 15 BAPS board  A/P rocks and M/L rocks x 20 reps x 2 sets each on Level 2  Standing stretch at steps- static stand on incline board with left LE- hold 30 sec progressed to placing right foot on floor to add progressive stretch to left LE - x 30 sec x 2.   Step tap with RLE (static stand on left LE) x 12 reps (no pain reported)  Star- RLE tap edges of star - x 3 rounds (touch each point with right LE except left side of left foot to avoid excessive crossover step)   Tandem standing (more difficult with left in rear position) x up to 30 sec - unsteady but did improve with 3 trials each way.   PATIENT EDUCATION:  Education details: Pt educated throughout session about proper posture and technique with exercises. Improved exercise technique, movement at target joints, use of target muscles after min to mod verbal, visual, tactile cues.    Person educated: Patient and Spouse Education method: Explanation Education comprehension: verbalized understanding  HOME EXERCISE PROGRAM:  Access Code: [email or web address] URL: [email or web address] Date: 01/27/2022 Prepared by: [place]  - 1 x daily - 7 x weekly - 3 sets - 10 reps - Seated Heel Raise  - 1 x daily - 7 x weekly - 3 sets - 10 reps - Seated Long Arc Quad  - 1 x daily - 7 x weekly - 3 sets - 10 reps - Ankle Inversion Eversion Towel Slide  - 1 x daily - 7 x weekly - 3 sets - 10 reps - Arch Lifting  - 1 x daily - 7 x weekly - 3 sets - 10 reps   ASSESSMENT:  CLINICAL IMPRESSION:  Patient continues to progress well- able to try more weight bearing activities today and no reported increase in pain with improving ability to stand with 100% weight bearing through Left LE. She was challenged with dynamic right LE activity yet performed better with practice gaining some confidence in her ability to stand on left LE. Pt will continue to benefit from skilled physical therapy intervention to address impairments, improve QOL,  and attain therapy goals.      OBJECTIVE IMPAIRMENTS: Abnormal gait, decreased balance, decreased mobility, difficulty walking, decreased ROM, decreased strength, hypomobility, impaired flexibility, and pain.   ACTIVITY LIMITATIONS: standing, squatting, stairs, transfers, bed mobility, bathing, dressing, and locomotion level  PARTICIPATION LIMITATIONS: meal prep, cleaning, laundry, driving, shopping, community  activity, yard work, and church  PERSONAL FACTORS: Age, Past/current experiences, Time since onset of injury/illness/exacerbation, and 3+ comorbidities: arthritis, HTN, generalized osteoporosis  are also affecting patient's functional outcome.   REHAB POTENTIAL: Good  CLINICAL DECISION MAKING: Stable/uncomplicated  EVALUATION COMPLEXITY: Moderate   GOALS: Goals reviewed with patient? Yes  SHORT TERM GOALS: Target date: 02/24/2022    Pt will be independent with HEP in order to demonstrate increased ability to perform tasks related to occupation/hobbies. Baseline: Pt given HEP at initial evaluation; 03/05/2022- Patient verbalized compliance with HEP and able to return demonstration of ankle ROM and LE strenghthening. Review general LE strengthening with patient and husband. Goal status: MET   LONG TERM GOALS: Target date: 07/14/2022  1.  Patient (> 63 years old) will complete five times sit to stand test in < 15 seconds indicating an increased LE strength and improved balance. Baseline: Not performed due to weightbearing restrictions. 03/05/2022- Deferred - waiting to discuss any restrictions with MD 2/12: 16.2 sec; 04/21/2022= 11 sec Goal status: MET  2.  Patient will increase FOTO score to equal to or greater than  50   to demonstrate statistically significant improvement in mobility and quality of life.  Baseline: FOTO: 40 2/12: 47; 04/21/2022= 50 Goal status: PROGRESSING   3.  Patient will increase Berg Balance score by > 6 points to demonstrate decreased fall risk during  functional activities. Baseline: Not performed due to time constraints. 2/12: 48/56; did not assess due to time constraints Goal status: ONGOING   4.  Patient will reduce timed up and go to <11 seconds to reduce fall risk and demonstrate improved transfer/gait ability. Baseline: 11.39 sec with walker 2/12:16.1 sec with SPC; 04/21/2022= 11.69 sec with use of SPC  Goal status: Progressing  5.  Patient will increase 10 meter walk test to >1.77m/s as to improve gait speed for better community ambulation and to reduce fall risk. Baseline: 18.59 sec - 0.54 m/s with walker 2/12: .52 m/s with SPC; 04/21/2022= 0.58 m/s with increased VC to take longer steps.  Goal status: Progressing   6.  Patient will increase six minute walk test distance to >1000 for progression to community ambulator and improve gait ability Baseline: Not performed at this time and will perform at later date. 2/12: 500 feet with no brace and with SPC some soreness in the  ankle; 04/21/2022= 535 feet with use of SPC Goal status: Progressing.    7. Patient will report ability to return to performing more functional activities including cleaning tub/toilet and vacuuming without LOB or significant difficulty.  Baseline: 04/21/2022-Patient reports she has significant difficulty with cleaning toilet and tub including vacuuming.  Goal status: INITIAL   PLAN:  PT FREQUENCY: 1-2x/week  PT DURATION: 12 weeks  PLANNED INTERVENTIONS: Therapeutic exercises, Therapeutic activity, Neuromuscular re-education, Balance training, Gait training, Patient/Family education, Self Care, Joint mobilization, Stair training, DME instructions, Aquatic Therapy, Dry Needling, Electrical stimulation, Cryotherapy, Moist heat, scar mobilization, Ultrasound, Parrafin, Fluidotherapy, and Manual therapy  PLAN FOR NEXT SESSION:   Ankle stability and balance training. Continue strengthening and ROM program. Continue with manual therapy for ROM and pain  relief.   Lewis Moccasin PT  Physical Therapist- [facility]   9:48 AM 06/05/22

## 2022-06-09 ENCOUNTER — Ambulatory Visit: Payer: Medicare Other

## 2022-06-11 ENCOUNTER — Ambulatory Visit: Payer: Medicare Other | Admitting: Physical Therapy

## 2022-06-11 DIAGNOSIS — R262 Difficulty in walking, not elsewhere classified: Secondary | ICD-10-CM

## 2022-06-11 DIAGNOSIS — R269 Unspecified abnormalities of gait and mobility: Secondary | ICD-10-CM

## 2022-06-11 DIAGNOSIS — M25672 Stiffness of left ankle, not elsewhere classified: Secondary | ICD-10-CM

## 2022-06-11 DIAGNOSIS — M25572 Pain in left ankle and joints of left foot: Secondary | ICD-10-CM | POA: Diagnosis not present

## 2022-06-11 NOTE — Therapy (Signed)
OUTPATIENT PHYSICAL THERAPY LOWER EXTREMITY TREATMENT / Physical Therapy Progress Note   Dates of reporting period  04/10/22   to   06/11/22    Patient Name: Hannah Tucker MRN: [id number] DOB:05/03/1940, 82 y.o., female Today's Date: 06/11/2022  END OF SESSION:   PT End of Session - 06/11/22 1422     Visit Number 30    Number of Visits 47    Date for PT Re-Evaluation 07/14/22    Authorization Type Medicare A&B Primary    Progress Note Due on Visit 30    PT Start Time 1423    PT Stop Time 1503    PT Time Calculation (min) 40 min    Equipment Utilized During Treatment Gait belt    Activity Tolerance Patient tolerated treatment well;No increased pain    Behavior During Therapy WFL for tasks assessed/performed                       Past Medical History:  Diagnosis Date   Arthritis    Complication of anesthesia    nausea   Dyspnea    with exertion   Family history of adverse reaction to anesthesia    PONV- MOM   Hypertension    Hypothyroidism    PONV (postoperative nausea and vomiting)    Sleep apnea    uses CPAP nightly   Stress incontinence    Valvular insufficiency, mitral    Past Surgical History:  Procedure Laterality Date   ANKLE ARTHROSCOPY WITH ARTHRODESIS Left 12/05/2021   Procedure: REPAIR OF TIBIAL NONUNION WITH AUTOGRAFT, TIBIOTALAR ARTHRODESIS;  Surgeon: Terance HartAdair, Lakie Mclouth R, MD;  Location: [facility];  Service: Orthopedics;  Laterality: Left;   APPLICATION OF WOUND VAC Right 01/04/2021   Procedure: APPLICATION OF WOUND VAC;  Surgeon: Myrene GalasHandy, Michael, MD;  Location: [facility];  Service: Orthopedics;  Laterality: Right;   BREAST CYST ASPIRATION Bilateral    neg   BREAST EXCISIONAL BIOPSY Right    1970's   CATARACT EXTRACTION W/ INTRAOCULAR LENS  IMPLANT, BILATERAL Bilateral    COLONOSCOPY     DILATION AND CURETTAGE OF UTERUS     HARDWARE REMOVAL Left 12/05/2021   Procedure: LEFT ANKLE DEEP HARDWARE REMOVAL, MEDIAL AND LATERAL;  Surgeon: Terance HartAdair,  Javohn Basey R, MD;  Location: [facility];  Service: Orthopedics;  Laterality: Left;  LENGTH OF SURGERY: 150 MINUTES   I & D EXTREMITY Left 01/04/2021   Procedure: IRRIGATION AND DEBRIDEMENT LEFT ANKLE;  Surgeon: Myrene GalasHandy, Michael, MD;  Location: [facility];  Service: Orthopedics;  Laterality: Left;   I & D EXTREMITY Left 01/07/2021   Procedure: IRRIGATION AND DEBRIDEMENT EXTREMITY, Left ankle;  Surgeon: Myrene GalasHandy, Michael, MD;  [facility];  Service: Orthopedics;  Laterality: Left;   KNEE ARTHROPLASTY Left 11/24/2016   Procedure: COMPUTER ASSISTED TOTAL KNEE ARTHROPLASTY;  Surgeon: Donato HeinzHooten, James P, MD;  Location: [facility];  Service: Orthopedics;  Laterality: Left;   KNEE ARTHROPLASTY Right 12/31/2020   Procedure: COMPUTER ASSISTED TOTAL KNEE ARTHROPLASTY;  Surgeon: Donato HeinzHooten, James P, MD;  Location: [facility];  Service: Orthopedics;  Laterality: Right;   KNEE ARTHROSCOPY Left    KNEE ARTHROSCOPY Left 07/18/2015   Procedure: ARTHROSCOPY KNEE WITH MEDIAL COMPARTMENTAL MENICUS CHONDROPLASTY;  Surgeon: Donato HeinzJames P Hooten, MD;  Location: [facility];  Service: Orthopedics;  Laterality: Left;   KNEE ARTHROSCOPY WITH LATERAL MENISECTOMY Left 07/18/2015   Procedure: LEFT KNEE ARTHROSCOPY WITH PARTIAL LATERAL MENISECTOMY GRADE 4 CHONDROMYLASIA LATERAL TIBIAL PLATEAU;  Surgeon: Donato HeinzJames P Hooten, MD;  Location: [facility];  Service: Orthopedics;  Laterality: Left;   ORIF ANKLE FRACTURE Left 01/04/2021   Procedure: OPEN REDUCTION INTERNAL FIXATION (ORIF) ANKLE FRACTURE;  Surgeon: Myrene Galas, MD;  Location: [facility];  Service: Orthopedics;  Laterality: Left;   ORIF ANKLE FRACTURE Left 01/07/2021   Procedure: SCREW REMOVAL ANKLE REMOVAL OF WOUND VAC LEFT LEG;  Surgeon: Myrene Galas, MD;  Location: [facility];  Service: Orthopedics;  Laterality: Left;   Patient Active Problem List   Diagnosis Date Noted   Arthritis of left ankle 12/05/2021   Sleep disturbance    Acute blood loss anemia 01/18/2021   Hypokalemia 01/18/2021   Constipation  01/18/2021   Trauma 01/08/2021   Open left ankle fracture, sequela 01/03/2021   Essential hypertension 01/03/2021   Hypothyroidism 01/03/2021   Total knee replacement status 12/31/2020   Family history of heart disease 04/12/2018   History of total knee arthroplasty, left 11/24/2016   Adult idiopathic generalized osteoporosis 03/01/2015   OSA on CPAP 01/30/2014    PCP: Bethann Punches, MD  REFERRING PROVIDER: Terance Hart, MD  REFERRING DIAG: Left tibiotalar arthrodesis with repair of tibial nonunion and hardware removal, 12/06/21  THERAPY DIAG:  Abnormality of gait and mobility  Stiffness of left ankle, not elsewhere classified  Difficulty in walking, not elsewhere classified  Pain in left ankle and joints of left foot  Rationale for Evaluation and Treatment: Rehabilitation  ONSET DATE: 12/06/21   SUBJECTIVE:   SUBJECTIVE STATEMENT: Pt reports her MD appointment went well. Pt reports the appointment went well and the surgery is healing well. She reports she is cleared to ride on the bike. She has been released form the MD's care unless she has any emergent concerns.     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 PMH includes HTN, valvular insufficiency, SUI.    PAIN:  Are you having pain? Yes: NPRS scale: 1/10 Pain location: more in the ankle Pain description: soreness Aggravating factors: Being on the knee scooter is more problematic on the surgically fixed knee.  PRECAUTIONS: Fall  WEIGHT BEARING RESTRICTIONS: Other: Now Weight bearing as tolerated  no Boot or brace requeired  FALLS:  Has patient fallen in last 6 months? No  LIVING ENVIRONMENT: Lives with: lives with their spouse Lives in: House/apartment Stairs: Yes: Internal: 15 steps; on left going up and External: 4  steps; bilateral but cannot reach both Has following equipment at home: Single point cane, Walker - 2 wheeled, Environmental consultant - 4 wheeled, Crutches, Tour manager, bed side commode, and Knee Scooter  OCCUPATION: Retired  PLOF: Independent  PATIENT GOALS: Pt wants to be able to go up/down the flight of steps.  NEXT MD VISIT:   OBJECTIVE:   DIAGNOSTIC FINDINGS:   CLINICAL DATA:  Fluoroscopic assistance for revision of internal fixation in left ankle   EXAM: LEFT ANKLE COMPLETE - 3+ VIEW   COMPARISON:  CT done on 09/24/2021   FINDINGS: There is interval placement of a new longer screw transfixing the medial malleolus. There is a new metallic plate along the anterior margin of distal tibia anchored to tibia and talus. Fluoroscopic time 1 minute and 11 seconds. Radiation dose 1.46 mGy.   IMPRESSION: Fluoroscopic assistance was provided for revision of internal fixation in left ankle.  PATIENT SURVEYS:  FOTO 40/50  COGNITION: Overall cognitive status:  Within functional limits for tasks assessed     SENSATION: WFL  PALPATION: No tenderness surrounding the ankle  LOWER EXTREMITY ROM:  Active ROM Right eval Left eval  Ankle dorsiflexion  2 deg  Ankle plantarflexion  13 deg  Ankle inversion  7 deg  Ankle eversion  3 deg   (Blank rows = not tested)   LOWER EXTREMITY SPECIAL TESTS: at eavl  Ankle special tests: Not performed at this time   FUNCTIONAL TESTS:   5 times sit to stand: 19.41 sec Timed up and go (TUG): 18.25 sec 10 meter walk test: 18.54 sec Dynamic Gait Index: Not performed due to weightbearing restrictions.   GAIT: At EVAL: Distance walked: Not performed due to weightbearing restrictions.  TODAY'S TREATMENT: DATE: 06/11/22  Physical therapy treatment session today consisted of completing assessment of goals and administration of testing as demonstrated and documented in flow sheet, treatment, and goals section of this note. Addition treatments may  be found below.     PATIENT EDUCATION:  Education details: Pt educated throughout session about proper posture and technique with exercises. Improved exercise technique, movement at target joints, use of target muscles after min to mod verbal, visual, tactile cues.    Person educated: Patient and Spouse Education method: Explanation Education comprehension: verbalized understanding  HOME EXERCISE PROGRAM:  Access Code: [email or web address] URL: [email or web address] Date: 01/27/2022 Prepared by: Tomasa Hose  Exercises - Towel Scrunches  - 1 x daily - 7 x weekly - 3 sets - 10 reps - Seated Heel Raise  - 1 x daily - 7 x weekly - 3 sets - 10 reps - Seated Long Arc Quad  - 1 x daily - 7 x weekly - 3 sets - 10 reps - Ankle Inversion Eversion Towel Slide  - 1 x daily - 7 x weekly - 3 sets - 10 reps - Arch Lifting  - 1 x daily - 7 x weekly - 3 sets - 10 reps   ASSESSMENT:  CLINICAL IMPRESSION:  Pt presents to PT for progress note this date. Pt assessed for various goals and shows great progress towards all goals and has met the several of her physical therapy goals.  Patient shows significant progress with Berg balance testing indicating low risk of falls and decreased need of assistive device.  Patient also make significant progress in 6-minute walk test but still not ambulating at safe community ambulator level.  Patient also makes progress with 10 m walk test but is not ambulating at speed indicative of low risk of falls.  Patient forms all testing without upper extremity support and without assistive device showing further independence with various activities.  Patient will continue through end of certification.  Focusing on higher-level balance and ambulation on various terrains including hills and unsteady surfaces to continue to improve her return to her prior level of function.Patient's condition has the potential to improve in response to therapy. Maximum improvement is yet to be  obtained. The anticipated improvement is attainable and reasonable in a generally predictable time. Pt will continue to benefit from skilled physical therapy intervention to address impairments, improve QOL, and attain therapy goals.       OBJECTIVE IMPAIRMENTS: Abnormal gait, decreased balance, decreased mobility, difficulty walking, decreased ROM, decreased strength, hypomobility, impaired flexibility, and pain.   ACTIVITY LIMITATIONS: standing, squatting, stairs, transfers, bed mobility, bathing, dressing, and locomotion level  PARTICIPATION LIMITATIONS: meal prep, cleaning, laundry, driving, shopping, community activity, yard work, and church  PERSONAL FACTORS: Age, Past/current experiences, Time  since onset of injury/illness/exacerbation, and 3+ comorbidities: arthritis, HTN, generalized osteoporosis  are also affecting patient's functional outcome.   REHAB POTENTIAL: Good  CLINICAL DECISION MAKING: Stable/uncomplicated  EVALUATION COMPLEXITY: Moderate   GOALS: Goals reviewed with patient? Yes  SHORT TERM GOALS: Target date: 02/24/2022    Pt will be independent with HEP in order to demonstrate increased ability to perform tasks related to occupation/hobbies. Baseline: Pt given HEP at initial evaluation; 03/05/2022- Patient verbalized compliance with HEP and able to return demonstration of ankle ROM and LE strenghthening. Review general LE strengthening with patient and husband. Goal status: MET   LONG TERM GOALS: Target date: 07/14/2022  1.  Patient (> 72 years old) will complete five times sit to stand test in < 15 seconds indicating an increased LE strength and improved balance. Baseline: Not performed due to weightbearing restrictions. 03/05/2022- Deferred - waiting to discuss any restrictions with MD 2/12: 16.2 sec; 04/21/2022= 11 sec Goal status: MET  2.  Patient will increase FOTO score to equal to or greater than  50   to demonstrate statistically significant improvement in  mobility and quality of life.  Baseline: FOTO: 40 2/12: 47; 04/21/2022= 50 Goal status: MET   3.  Patient will increase Berg Balance score by > 6 points to demonstrate decreased fall risk during functional activities. Baseline: Not performed due to time constraints. 2/12: 48/56; did not assess due to time constraints 06/10/21: 53 Goal status: MET    4.  Patient will reduce timed up and go to <11 seconds to reduce fall risk and demonstrate improved transfer/gait ability. Baseline: 11.39 sec with walker 2/12:16.1 sec with SPC; 04/21/2022= 11.69 sec with use of SPC 4/10: 10.6 sec no AD Goal status: Met  5.  Patient will increase 10 meter walk test to >1.60m/s as to improve gait speed for better community ambulation and to reduce fall risk. Baseline: 18.59 sec - 0.54 m/s with walker 2/12: .52 m/s with SPC; 04/21/2022= 0.58 m/s with increased VC to take longer steps. 4/10:.82 m/s Goal status: Progressing   6.  Patient will increase six minute walk test distance to >1000 for progression to community ambulator and improve gait ability Baseline: Not performed at this time and will perform at later date. 2/12: 500 feet with no brace and with SPC some soreness in the  ankle; 04/21/2022= 535 feet with use of SPC 1/10: 805 ft without AD  Goal status: Progressing    7. Patient will report ability to return to performing more functional activities including cleaning tub/toilet and vacuuming without LOB or significant difficulty.  Baseline: 04/21/2022-Patient reports she has significant difficulty with cleaning toilet and tub including vacuuming. 4/10: able to do but has to make modifications due to balance aspect of tasks.  Goal status: ONGOING   PLAN:  PT FREQUENCY: 1-2x/week  PT DURATION: 12 weeks  PLANNED INTERVENTIONS: Therapeutic exercises, Therapeutic activity, Neuromuscular re-education, Balance training, Gait training, Patient/Family education, Self Care, Joint mobilization, Stair training, DME  instructions, Aquatic Therapy, Dry Needling, Electrical stimulation, Cryotherapy, Moist heat, scar mobilization, Ultrasound, Parrafin, Fluidotherapy, and Manual therapy  PLAN FOR NEXT SESSION:   Ankle stability and balance training. Continue strengthening and ROM program. Incorporate higher level balance ambulation tasks and endurance training. Consider walking outside if weather is appropriate.    Norman Herrlich PT  Physical Therapist- [facility]   4:53 PM 06/11/22

## 2022-06-16 ENCOUNTER — Ambulatory Visit: Payer: Medicare Other

## 2022-06-18 ENCOUNTER — Ambulatory Visit: Payer: Medicare Other

## 2022-06-18 DIAGNOSIS — M25572 Pain in left ankle and joints of left foot: Secondary | ICD-10-CM | POA: Diagnosis not present

## 2022-06-18 DIAGNOSIS — M6281 Muscle weakness (generalized): Secondary | ICD-10-CM

## 2022-06-18 DIAGNOSIS — R52 Pain, unspecified: Secondary | ICD-10-CM

## 2022-06-18 DIAGNOSIS — R269 Unspecified abnormalities of gait and mobility: Secondary | ICD-10-CM

## 2022-06-18 DIAGNOSIS — R262 Difficulty in walking, not elsewhere classified: Secondary | ICD-10-CM

## 2022-06-18 DIAGNOSIS — M25672 Stiffness of left ankle, not elsewhere classified: Secondary | ICD-10-CM

## 2022-06-18 NOTE — Therapy (Signed)
OUTPATIENT PHYSICAL THERAPY LOWER EXTREMITY TREATMENT    Patient Name: Hannah Tucker MRN: [id number] DOB:[birth year], 82 y.o., female Today's Date: 06/18/2022  END OF SESSION:   PT End of Session - 06/18/22 1551     Visit Number 31    Number of Visits 47    Date for PT Re-Evaluation 07/14/22    Authorization Type Medicare A&B Primary    Progress Note Due on Visit 30    PT Start Time 1302    PT Stop Time 1344    PT Time Calculation (min) 42 min    Equipment Utilized During Treatment Gait belt    Activity Tolerance Patient tolerated treatment well;No increased pain    Behavior During Therapy WFL for tasks assessed/performed                       Past Medical History:  Diagnosis Date   Arthritis    Complication of anesthesia    nausea   Dyspnea    with exertion   Family history of adverse reaction to anesthesia    PONV- MOM   Hypertension    Hypothyroidism    PONV (postoperative nausea and vomiting)    Sleep apnea    uses CPAP nightly   Stress incontinence    Valvular insufficiency, mitral    Past Surgical History:  Procedure Laterality Date   ANKLE ARTHROSCOPY WITH ARTHRODESIS Left 12/05/2021   Procedure: REPAIR OF TIBIAL NONUNION WITH AUTOGRAFT, TIBIOTALAR ARTHRODESIS;  Surgeon: Terance Hart, MD;  Location: [facility] OR;  Service: Orthopedics;  Laterality: Left;   APPLICATION OF WOUND VAC Right 01/04/2021   Procedure: APPLICATION OF WOUND VAC;  Surgeon: Myrene Galas, MD;  Location: [facility];  Service: Orthopedics;  Laterality: Right;   BREAST CYST ASPIRATION Bilateral    neg   BREAST EXCISIONAL BIOPSY Right    1970's   CATARACT EXTRACTION W/ INTRAOCULAR LENS  IMPLANT, BILATERAL Bilateral    COLONOSCOPY     DILATION AND CURETTAGE OF UTERUS     HARDWARE REMOVAL Left 12/05/2021   Procedure: LEFT ANKLE DEEP HARDWARE REMOVAL, MEDIAL AND LATERAL;  Surgeon: Terance Hart, MD;  Location: [facility];  Service: Orthopedics;  Laterality: Left;  LENGTH OF  SURGERY: 150 MINUTES   I & D EXTREMITY Left 01/04/2021   Procedure: IRRIGATION AND DEBRIDEMENT LEFT ANKLE;  Surgeon: Myrene Galas, MD;  Location: [facility];  Service: Orthopedics;  Laterality: Left;   I & D EXTREMITY Left 01/07/2021   Procedure: IRRIGATION AND DEBRIDEMENT EXTREMITY, Left ankle;  Surgeon: Myrene Galas, MD;  Location: [facility] OR;  Service: Orthopedics;  Laterality: Left;   KNEE ARTHROPLASTY Left 11/24/2016   Procedure: COMPUTER ASSISTED TOTAL KNEE ARTHROPLASTY;  Surgeon: Donato Heinz, MD;  Location: [facility];  Service: Orthopedics;  Laterality: Left;   KNEE ARTHROPLASTY Right 12/31/2020   Procedure: COMPUTER ASSISTED TOTAL KNEE ARTHROPLASTY;  Surgeon: Donato Heinz, MD;  Location: [facility];  Service: Orthopedics;  Laterality: Right;   KNEE ARTHROSCOPY Left    KNEE ARTHROSCOPY Left 07/18/2015   Procedure: ARTHROSCOPY KNEE WITH MEDIAL COMPARTMENTAL MENICUS CHONDROPLASTY;  Surgeon: Donato Heinz, MD;  Location: [facility];  Service: Orthopedics;  Laterality: Left;   KNEE ARTHROSCOPY WITH LATERAL MENISECTOMY Left 07/18/2015   Procedure: LEFT KNEE ARTHROSCOPY WITH PARTIAL LATERAL MENISECTOMY GRADE 4 CHONDROMYLASIA LATERAL TIBIAL PLATEAU;  Surgeon: Donato Heinz, MD;  Location: [facility];  Service: Orthopedics;  Laterality: Left;   ORIF ANKLE FRACTURE Left 01/04/2021   Procedure:  OPEN REDUCTION INTERNAL FIXATION (ORIF) ANKLE FRACTURE;  Surgeon: Myrene Galas, MD;  Location: [facility];  Service: Orthopedics;  Laterality: Left;   ORIF ANKLE FRACTURE Left 01/07/2021   Procedure: SCREW REMOVAL ANKLE REMOVAL OF WOUND VAC LEFT LEG;  Surgeon: Myrene Galas, MD;  Location: [facility];  Service: Orthopedics;  Laterality: Left;   Patient Active Problem List   Diagnosis Date Noted   Arthritis of left ankle 12/05/2021   Sleep disturbance    Acute blood loss anemia 01/18/2021   Hypokalemia 01/18/2021   Constipation 01/18/2021   Trauma 01/08/2021   Open left ankle fracture, sequela 01/03/2021   Essential  hypertension 01/03/2021   Hypothyroidism 01/03/2021   Total knee replacement status 12/31/2020   Family history of heart disease 04/12/2018   History of total knee arthroplasty, left 11/24/2016   Adult idiopathic generalized osteoporosis 03/01/2015   OSA on CPAP 01/30/2014    PCP: Bethann Punches, MD  REFERRING PROVIDER: Terance Hart, MD  REFERRING DIAG: Left tibiotalar arthrodesis with repair of tibial nonunion and hardware removal, 12/06/21  THERAPY DIAG:  Abnormality of gait and mobility  Stiffness of left ankle, not elsewhere classified  Difficulty in walking, not elsewhere classified  Pain in left ankle and joints of left foot  Muscle weakness (generalized)  Pain  Rationale for Evaluation and Treatment: Rehabilitation  ONSET DATE: 12/06/21   SUBJECTIVE:   SUBJECTIVE STATEMENT: Patient reports doing well overall with no new issues since last visit.    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 PMH includes HTN, valvular insufficiency, SUI.    PAIN:  Are you having pain? Yes: NPRS scale: 1/10 Pain location: more in the ankle Pain description: soreness Aggravating factors: Being on the knee scooter is more problematic on the surgically fixed knee.  PRECAUTIONS: Fall  WEIGHT BEARING RESTRICTIONS: Other: Now Weight bearing as tolerated  no Boot or brace requeired  FALLS:  Has patient fallen in last 6 months? No  LIVING ENVIRONMENT: Lives with: lives with their spouse Lives in: House/apartment Stairs: Yes: Internal: 15 steps; on left going up and External: 4 steps; bilateral but cannot reach both Has following equipment at home: Single point cane, Walker - 2 wheeled, Environmental consultant - 4 wheeled, Crutches, Tour manager, bed side commode, and Knee Scooter  OCCUPATION: Retired  PLOF:  Independent  PATIENT GOALS: Pt wants to be able to go up/down the flight of steps.  NEXT MD VISIT:   OBJECTIVE:   DIAGNOSTIC FINDINGS:   CLINICAL DATA:  Fluoroscopic assistance for revision of internal fixation in left ankle   EXAM: LEFT ANKLE COMPLETE - 3+ VIEW   COMPARISON:  CT done on 09/24/2021   FINDINGS: There is interval placement of a new longer screw transfixing the medial malleolus. There is a new metallic plate along the anterior margin of distal tibia anchored to tibia and talus. Fluoroscopic time 1 minute and 11 seconds. Radiation dose 1.46 mGy.   IMPRESSION: Fluoroscopic assistance was provided for revision of internal fixation in left ankle.  PATIENT SURVEYS:  FOTO 40/50  COGNITION: Overall cognitive status: Within functional limits for tasks assessed     SENSATION: WFL  PALPATION: No tenderness surrounding the ankle  LOWER EXTREMITY ROM:  Active ROM Right eval Left eval  Ankle dorsiflexion  2 deg  Ankle plantarflexion  13 deg  Ankle inversion  7 deg  Ankle eversion  3 deg   (Blank rows = not tested)   LOWER EXTREMITY SPECIAL TESTS: at eavl  Ankle special tests: Not performed at this time   FUNCTIONAL TESTS:   5 times sit to stand: 19.41 sec Timed up and go (TUG): 18.25 sec 10 meter walk test: 18.54 sec Dynamic Gait Index: Not performed due to weightbearing restrictions.   GAIT: At EVAL: Distance walked: Not performed due to weightbearing restrictions.  TODAY'S TREATMENT: DATE: 06/18/22  Therapeutic activities:  ambulate across stable and unstable surface outside. Negotiating changing surfaces from grass to sidewalk, across brick with turns and obstacles in pathway, up/down incline without LOB.  Approx 25 min total time  Step- From lower level up to 1st floor- 20+ steps using 1 railing and SPC on opp UE- step over step both going up and going down- No significant difficulty today.   Single leg stance in // bars- attempting to  hold as long as possible 2-4 sec today each LE- Patient reported as "extreme difficulty"  Dynamic step tap without UE support x 20 reps- VC to perform slowly for improved SLS.       PATIENT EDUCATION:  Education details: Pt educated throughout session about proper posture and technique with exercises. Improved exercise technique, movement at target joints, use of target muscles after min to mod verbal, visual, tactile cues.    Person educated: Patient and Spouse Education method: Explanation Education comprehension: verbalized understanding  HOME EXERCISE PROGRAM:  Access Code: [email or web address] URL: [email or web address] Date: 01/27/2022 Prepared by: Tomasa Hose  Exercises - Towel Scrunches  - 1 x daily - 7 x weekly - 3 sets - 10 reps - Seated Heel Raise  - 1 x daily - 7 x weekly - 3 sets - 10 reps - Seated Long Arc Quad  - 1 x daily - 7 x weekly - 3 sets - 10 reps - Ankle Inversion Eversion Towel Slide  - 1 x daily - 7 x weekly - 3 sets - 10 reps - Arch Lifting  - 1 x daily - 7 x weekly - 3 sets - 10 reps   ASSESSMENT:  CLINICAL IMPRESSION:  Treatment continued as laid out in previous assessment and plan to concentrate on higher level balance activities and gait outdoors. She performed extremely well- able to walk outdoor for approx 25 min on all surfaces and including incline and later performed well with negotiating steps. She continues to be challenged with all SLS (no pain) just difficult and will benefit from continued training.  Pt will continue to benefit from skilled physical therapy intervention to address impairments, improve QOL, and attain therapy goals.       OBJECTIVE IMPAIRMENTS: Abnormal gait, decreased balance, decreased mobility, difficulty walking, decreased ROM, decreased strength, hypomobility, impaired flexibility, and pain.   ACTIVITY LIMITATIONS: standing, squatting, stairs, transfers, bed mobility, bathing, dressing, and locomotion  level  PARTICIPATION LIMITATIONS: meal prep, cleaning, laundry, driving, shopping, community activity, yard work, and church  PERSONAL FACTORS: Age, Past/current experiences, Time since onset of injury/illness/exacerbation, and 3+ comorbidities: arthritis, HTN, generalized osteoporosis  are also affecting patient's functional outcome.   REHAB POTENTIAL: Good  CLINICAL DECISION MAKING: Stable/uncomplicated  EVALUATION COMPLEXITY: Moderate   GOALS: Goals reviewed with patient? Yes  SHORT TERM GOALS: Target date: 02/24/2022    Pt will be independent with HEP in order to demonstrate increased ability to perform tasks related to occupation/hobbies. Baseline: Pt given HEP at initial evaluation; 03/05/2022- Patient verbalized compliance with HEP  and able to return demonstration of ankle ROM and LE strenghthening. Review general LE strengthening with patient and husband. Goal status: MET   LONG TERM GOALS: Target date: 07/14/2022  1.  Patient (> 45 years old) will complete five times sit to stand test in < 15 seconds indicating an increased LE strength and improved balance. Baseline: Not performed due to weightbearing restrictions. 03/05/2022- Deferred - waiting to discuss any restrictions with MD 2/12: 16.2 sec; 04/21/2022= 11 sec Goal status: MET  2.  Patient will increase FOTO score to equal to or greater than  50   to demonstrate statistically significant improvement in mobility and quality of life.  Baseline: FOTO: 40 2/12: 47; 04/21/2022= 50 Goal status: MET   3.  Patient will increase Berg Balance score by > 6 points to demonstrate decreased fall risk during functional activities. Baseline: Not performed due to time constraints. 2/12: 48/56; did not assess due to time constraints 06/10/21: 53 Goal status: MET    4.  Patient will reduce timed up and go to <11 seconds to reduce fall risk and demonstrate improved transfer/gait ability. Baseline: 11.39 sec with walker 2/12:16.1 sec with  SPC; 04/21/2022= 11.69 sec with use of SPC 4/10: 10.6 sec no AD Goal status: Met  5.  Patient will increase 10 meter walk test to >1.28m/s as to improve gait speed for better community ambulation and to reduce fall risk. Baseline: 18.59 sec - 0.54 m/s with walker 2/12: .52 m/s with SPC; 04/21/2022= 0.58 m/s with increased VC to take longer steps. 4/10:.82 m/s Goal status: Progressing   6.  Patient will increase six minute walk test distance to >1000 for progression to community ambulator and improve gait ability Baseline: Not performed at this time and will perform at later date. 2/12: 500 feet with no brace and with SPC some soreness in the  ankle; 04/21/2022= 535 feet with use of SPC 1/10: 805 ft without AD  Goal status: Progressing    7. Patient will report ability to return to performing more functional activities including cleaning tub/toilet and vacuuming without LOB or significant difficulty.  Baseline: 04/21/2022-Patient reports she has significant difficulty with cleaning toilet and tub including vacuuming. 4/10: able to do but has to make modifications due to balance aspect of tasks.  Goal status: ONGOING   PLAN:  PT FREQUENCY: 1-2x/week  PT DURATION: 12 weeks  PLANNED INTERVENTIONS: Therapeutic exercises, Therapeutic activity, Neuromuscular re-education, Balance training, Gait training, Patient/Family education, Self Care, Joint mobilization, Stair training, DME instructions, Aquatic Therapy, Dry Needling, Electrical stimulation, Cryotherapy, Moist heat, scar mobilization, Ultrasound, Parrafin, Fluidotherapy, and Manual therapy  PLAN FOR NEXT SESSION:   Ankle stability and balance training. Continue strengthening and ROM program. Incorporate higher level balance ambulation tasks and endurance training.    Lenda Kelp PT  Physical Therapist- [place]  [facility]   3:51 PM 06/18/22

## 2022-06-23 ENCOUNTER — Ambulatory Visit: Payer: Medicare Other

## 2022-06-25 ENCOUNTER — Ambulatory Visit: Payer: Medicare Other | Admitting: Physical Therapy

## 2022-06-25 DIAGNOSIS — M25572 Pain in left ankle and joints of left foot: Secondary | ICD-10-CM | POA: Diagnosis not present

## 2022-06-25 DIAGNOSIS — M6281 Muscle weakness (generalized): Secondary | ICD-10-CM

## 2022-06-25 DIAGNOSIS — R262 Difficulty in walking, not elsewhere classified: Secondary | ICD-10-CM

## 2022-06-25 DIAGNOSIS — R269 Unspecified abnormalities of gait and mobility: Secondary | ICD-10-CM

## 2022-06-25 DIAGNOSIS — M25672 Stiffness of left ankle, not elsewhere classified: Secondary | ICD-10-CM

## 2022-06-25 NOTE — Therapy (Signed)
OUTPATIENT PHYSICAL THERAPY LOWER EXTREMITY TREATMENT    Patient Name: Hannah Tucker MRN: [id number] DOB:08-13-1940, 82 y.o., female Today's Date: 06/25/2022  END OF SESSION:   PT End of Session - 06/25/22 1342     Visit Number 32    Number of Visits 47    Date for PT Re-Evaluation 07/14/22    Authorization Type Medicare A&B Primary    Progress Note Due on Visit 40    PT Start Time 1308    PT Stop Time 1343    PT Time Calculation (min) 35 min    Equipment Utilized During Treatment Gait belt    Activity Tolerance Patient tolerated treatment well;No increased pain    Behavior During Therapy WFL for tasks assessed/performed                        Past Medical History:  Diagnosis Date   Arthritis    Complication of anesthesia    nausea   Dyspnea    with exertion   Family history of adverse reaction to anesthesia    PONV- MOM   Hypertension    Hypothyroidism    PONV (postoperative nausea and vomiting)    Sleep apnea    uses CPAP nightly   Stress incontinence    Valvular insufficiency, mitral    Past Surgical History:  Procedure Laterality Date   ANKLE ARTHROSCOPY WITH ARTHRODESIS Left 12/05/2021   Procedure: REPAIR OF TIBIAL NONUNION WITH AUTOGRAFT, TIBIOTALAR ARTHRODESIS;  Surgeon: Terance Hart, MD;  Location: [facility] OR;  Service: Orthopedics;  Laterality: Left;   APPLICATION OF WOUND VAC Right 01/04/2021   Procedure: APPLICATION OF WOUND VAC;  Surgeon: Myrene Galas, MD;  Location: [facility];  Service: Orthopedics;  Laterality: Right;   BREAST CYST ASPIRATION Bilateral    neg   BREAST EXCISIONAL BIOPSY Right    1970's   CATARACT EXTRACTION W/ INTRAOCULAR LENS  IMPLANT, BILATERAL Bilateral    COLONOSCOPY     DILATION AND CURETTAGE OF UTERUS     HARDWARE REMOVAL Left 12/05/2021   Procedure: LEFT ANKLE DEEP HARDWARE REMOVAL, MEDIAL AND LATERAL;  Surgeon: Terance Hart, MD;  Location: [facility];  Service: Orthopedics;  Laterality: Left;  LENGTH OF  SURGERY: 150 MINUTES   I & D EXTREMITY Left 01/04/2021   Procedure: IRRIGATION AND DEBRIDEMENT LEFT ANKLE;  Surgeon: Myrene Galas, MD;  Location: [facility];  Service: Orthopedics;  Laterality: Left;   I & D EXTREMITY Left 01/07/2021   Procedure: IRRIGATION AND DEBRIDEMENT EXTREMITY, Left ankle;  Surgeon: Myrene Galas, MD;  Location: [facility] OR;  Service: Orthopedics;  Laterality: Left;   KNEE ARTHROPLASTY Left 11/24/2016   Procedure: COMPUTER ASSISTED TOTAL KNEE ARTHROPLASTY;  Surgeon: Donato Heinz, MD;  Location: [facility];  Service: Orthopedics;  Laterality: Left;   KNEE ARTHROPLASTY Right 12/31/2020   Procedure: COMPUTER ASSISTED TOTAL KNEE ARTHROPLASTY;  Surgeon: Donato Heinz, MD;  Location: [facility];  Service: Orthopedics;  Laterality: Right;   KNEE ARTHROSCOPY Left    KNEE ARTHROSCOPY Left 07/18/2015   Procedure: ARTHROSCOPY KNEE WITH MEDIAL COMPARTMENTAL MENICUS CHONDROPLASTY;  Surgeon: Donato Heinz, MD;  Location: [facility];  Service: Orthopedics;  Laterality: Left;   KNEE ARTHROSCOPY WITH LATERAL MENISECTOMY Left 07/18/2015   Procedure: LEFT KNEE ARTHROSCOPY WITH PARTIAL LATERAL MENISECTOMY GRADE 4 CHONDROMYLASIA LATERAL TIBIAL PLATEAU;  Surgeon: Donato Heinz, MD;  Location: [facility];  Service: Orthopedics;  Laterality: Left;   ORIF ANKLE FRACTURE Left 01/04/2021  Procedure: OPEN REDUCTION INTERNAL FIXATION (ORIF) ANKLE FRACTURE;  Surgeon: Myrene Galas, MD;  Location: [facility];  Service: Orthopedics;  Laterality: Left;   ORIF ANKLE FRACTURE Left 01/07/2021   Procedure: SCREW REMOVAL ANKLE REMOVAL OF WOUND VAC LEFT LEG;  Surgeon: Myrene Galas, MD;  Location: [facility];  Service: Orthopedics;  Laterality: Left;   Patient Active Problem List   Diagnosis Date Noted   Arthritis of left ankle 12/05/2021   Sleep disturbance    Acute blood loss anemia 01/18/2021   Hypokalemia 01/18/2021   Constipation 01/18/2021   Trauma 01/08/2021   Open left ankle fracture, sequela 01/03/2021   Essential  hypertension 01/03/2021   Hypothyroidism 01/03/2021   Total knee replacement status 12/31/2020   Family history of heart disease 04/12/2018   History of total knee arthroplasty, left 11/24/2016   Adult idiopathic generalized osteoporosis 03/01/2015   OSA on CPAP 01/30/2014    PCP: Bethann Punches, MD  REFERRING PROVIDER: Terance Hart, MD  REFERRING DIAG: Left tibiotalar arthrodesis with repair of tibial nonunion and hardware removal, 12/06/21  THERAPY DIAG:  No diagnosis found.  Rationale for Evaluation and Treatment: Rehabilitation  ONSET DATE: 12/06/21   SUBJECTIVE:   SUBJECTIVE STATEMENT: Patient reports getting stuck behind fertilizer truck and causing her to be late today. Pt reports yesterday as a bad day in that she was having a lot of discomfort when she was up and walking around. She reports she did not have a shoe on for a lot of her standing is also wearing flats on Sunday which may have increased her foot pain.   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 PMH includes HTN, valvular insufficiency, SUI.    PAIN:  Are you having pain? Yes: NPRS scale: 1/10 Pain location: more in the ankle Pain description: soreness Aggravating factors: Being on the knee scooter is more problematic on the surgically fixed knee.  PRECAUTIONS: Fall  WEIGHT BEARING RESTRICTIONS: Other: Now Weight bearing as tolerated  no Boot or brace requeired  FALLS:  Has patient fallen in last 6 months? No  LIVING ENVIRONMENT: Lives with: lives with their spouse Lives in: House/apartment Stairs: Yes: Internal: 15 steps; on left going up and External: 4 steps; bilateral but cannot reach both Has following equipment at home: Single point cane, Walker - 2 wheeled, Environmental consultant - 4 wheeled, Crutches, Tour manager, bed  side commode, and Knee Scooter  OCCUPATION: Retired  PLOF: Independent  PATIENT GOALS: Pt wants to be able to go up/down the flight of steps.  NEXT MD VISIT:   OBJECTIVE:   DIAGNOSTIC FINDINGS:   CLINICAL DATA:  Fluoroscopic assistance for revision of internal fixation in left ankle   EXAM: LEFT ANKLE COMPLETE - 3+ VIEW   COMPARISON:  CT done on 09/24/2021   FINDINGS: There is interval placement of a new longer screw transfixing the medial malleolus. There is a new metallic plate along the anterior margin of distal tibia anchored to tibia and talus. Fluoroscopic time 1 minute and 11 seconds. Radiation dose 1.46 mGy.   IMPRESSION: Fluoroscopic assistance was provided for revision of internal fixation in left ankle.  PATIENT SURVEYS:  FOTO 40/50  COGNITION: Overall cognitive status: Within functional limits for tasks assessed     SENSATION: WFL  PALPATION: No tenderness surrounding the ankle  LOWER EXTREMITY ROM:  Active  ROM Right eval Left eval  Ankle dorsiflexion  2 deg  Ankle plantarflexion  13 deg  Ankle inversion  7 deg  Ankle eversion  3 deg   (Blank rows = not tested)   LOWER EXTREMITY SPECIAL TESTS: at eavl  Ankle special tests: Not performed at this time   FUNCTIONAL TESTS:   5 times sit to stand: 19.41 sec Timed up and go (TUG): 18.25 sec 10 meter walk test: 18.54 sec Dynamic Gait Index: Not performed due to weightbearing restrictions.   GAIT: At EVAL: Distance walked: Not performed due to weightbearing restrictions.  TODAY'S TREATMENT: DATE: 06/25/22  TA   Mixed ambulation indoors with straight point cane as well as without straight point cane.  Ambulated across various surfaces including carpet, inclines, and declines.  Physical therapy is unable to ambulate outside the state secondary to weather conditions but patient did ambulate up and down to long flights of steps without upper extremity assist and with independent management  of her straight point cane.  Patient did have some fatigue following ambulation of steps and was able to continue ambulating without problem.  Patient completed 4 bouts of ambulation each lasting approximately 8 minutes at a time.  Patient ambulating with good step through pattern of gait, utilizing cane patient able to take equal step length bilaterally.  Without cane patient initially has good equal step length bilaterally but then tends to favor her right lower extremity.      PATIENT EDUCATION:  Education details: Pt educated throughout session about proper posture and technique with exercises. Improved exercise technique, movement at target joints, use of target muscles after min to mod verbal, visual, tactile cues.    Person educated: Patient and Spouse Education method: Explanation Education comprehension: verbalized understanding  HOME EXERCISE PROGRAM:  Access Code: [email or web address] URL: [email or web address] Date: 01/27/2022 Prepared by: Tomasa Hose  Exercises - Towel Scrunches  - 1 x daily - 7 x weekly - 3 sets - 10 reps - Seated Heel Raise  - 1 x daily - 7 x weekly - 3 sets - 10 reps - Seated Long Arc Quad  - 1 x daily - 7 x weekly - 3 sets - 10 reps - Ankle Inversion Eversion Towel Slide  - 1 x daily - 7 x weekly - 3 sets - 10 reps - Arch Lifting  - 1 x daily - 7 x weekly - 3 sets - 10 reps   ASSESSMENT:  CLINICAL IMPRESSION:  Treatment continued as laid out in previous assessment and plan to concentrate on gait activities focusing on functional endurance as well as ability to ambulate up and down stairs while independently managing her straight point cane.  Patient performs all this well but does have some fatigue and when ambulating without cane patient still favors her right lower extremity demonstrating decreased step length. Pt will continue to benefit from skilled physical therapy intervention to address impairments, improve QOL, and attain therapy goals.    Unless otherwise stated, CGA was provided and gait belt donned in order to ensure pt safety    OBJECTIVE IMPAIRMENTS: Abnormal gait, decreased balance, decreased mobility, difficulty walking, decreased ROM, decreased strength, hypomobility, impaired flexibility, and pain.   ACTIVITY LIMITATIONS: standing, squatting, stairs, transfers, bed mobility, bathing, dressing, and locomotion level  PARTICIPATION LIMITATIONS: meal prep, cleaning, laundry, driving, shopping, community activity, yard work, and church  PERSONAL FACTORS: Age, Past/current experiences, Time since onset of injury/illness/exacerbation, and 3+ comorbidities: arthritis, HTN, generalized osteoporosis  are also affecting patient's functional  outcome.   REHAB POTENTIAL: Good  CLINICAL DECISION MAKING: Stable/uncomplicated  EVALUATION COMPLEXITY: Moderate   GOALS: Goals reviewed with patient? Yes  SHORT TERM GOALS: Target date: 02/24/2022    Pt will be independent with HEP in order to demonstrate increased ability to perform tasks related to occupation/hobbies. Baseline: Pt given HEP at initial evaluation; 03/05/2022- Patient verbalized compliance with HEP and able to return demonstration of ankle ROM and LE strenghthening. Review general LE strengthening with patient and husband. Goal status: MET   LONG TERM GOALS: Target date: 07/14/2022  1.  Patient (> 85 years old) will complete five times sit to stand test in < 15 seconds indicating an increased LE strength and improved balance. Baseline: Not performed due to weightbearing restrictions. 03/05/2022- Deferred - waiting to discuss any restrictions with MD 2/12: 16.2 sec; 04/21/2022= 11 sec Goal status: MET  2.  Patient will increase FOTO score to equal to or greater than  50   to demonstrate statistically significant improvement in mobility and quality of life.  Baseline: FOTO: 40 2/12: 47; 04/21/2022= 50 Goal status: MET   3.  Patient will increase Berg Balance score  by > 6 points to demonstrate decreased fall risk during functional activities. Baseline: Not performed due to time constraints. 2/12: 48/56; did not assess due to time constraints 06/10/21: 53 Goal status: MET    4.  Patient will reduce timed up and go to <11 seconds to reduce fall risk and demonstrate improved transfer/gait ability. Baseline: 11.39 sec with walker 2/12:16.1 sec with SPC; 04/21/2022= 11.69 sec with use of SPC 4/10: 10.6 sec no AD Goal status: Met  5.  Patient will increase 10 meter walk test to >1.1m/s as to improve gait speed for better community ambulation and to reduce fall risk. Baseline: 18.59 sec - 0.54 m/s with walker 2/12: .52 m/s with SPC; 04/21/2022= 0.58 m/s with increased VC to take longer steps. 4/10:.82 m/s Goal status: Progressing   6.  Patient will increase six minute walk test distance to >1000 for progression to community ambulator and improve gait ability Baseline: Not performed at this time and will perform at later date. 2/12: 500 feet with no brace and with SPC some soreness in the  ankle; 04/21/2022= 535 feet with use of SPC 1/10: 805 ft without AD  Goal status: Progressing    7. Patient will report ability to return to performing more functional activities including cleaning tub/toilet and vacuuming without LOB or significant difficulty.  Baseline: 04/21/2022-Patient reports she has significant difficulty with cleaning toilet and tub including vacuuming. 4/10: able to do but has to make modifications due to balance aspect of tasks.  Goal status: ONGOING   PLAN:  PT FREQUENCY: 1-2x/week  PT DURATION: 12 weeks  PLANNED INTERVENTIONS: Therapeutic exercises, Therapeutic activity, Neuromuscular re-education, Balance training, Gait training, Patient/Family education, Self Care, Joint mobilization, Stair training, DME instructions, Aquatic Therapy, Dry Needling, Electrical stimulation, Cryotherapy, Moist heat, scar mobilization, Ultrasound, Parrafin,  Fluidotherapy, and Manual therapy  PLAN FOR NEXT SESSION:   Ankle stability and balance training. Continue strengthening and ROM program. Incorporate higher level balance ambulation tasks and endurance training.    Norman Herrlich PT  Physical Therapist- [facility]   2:19 PM 06/25/22

## 2022-06-30 ENCOUNTER — Ambulatory Visit: Payer: Medicare Other

## 2022-07-02 ENCOUNTER — Ambulatory Visit: Payer: Medicare Other | Attending: Orthopaedic Surgery

## 2022-07-02 DIAGNOSIS — M6281 Muscle weakness (generalized): Secondary | ICD-10-CM

## 2022-07-02 DIAGNOSIS — R262 Difficulty in walking, not elsewhere classified: Secondary | ICD-10-CM | POA: Diagnosis present

## 2022-07-02 DIAGNOSIS — M25572 Pain in left ankle and joints of left foot: Secondary | ICD-10-CM | POA: Insufficient documentation

## 2022-07-02 DIAGNOSIS — R269 Unspecified abnormalities of gait and mobility: Secondary | ICD-10-CM | POA: Diagnosis present

## 2022-07-02 DIAGNOSIS — M25672 Stiffness of left ankle, not elsewhere classified: Secondary | ICD-10-CM

## 2022-07-02 NOTE — Therapy (Signed)
OUTPATIENT PHYSICAL THERAPY LOWER EXTREMITY TREATMENT    Patient Name: Hannah Tucker MRN: [id number] DOB:1940/03/16, 82 y.o., female Today's Date: 07/02/2022  END OF SESSION:   PT End of Session - 07/02/22 1505     Visit Number 33    Number of Visits 47    Date for PT Re-Evaluation 07/14/22    Authorization Type Medicare A&B Primary    Progress Note Due on Visit 40    PT Start Time 1101    PT Stop Time 1143    PT Time Calculation (min) 42 min    Equipment Utilized During Treatment Gait belt    Activity Tolerance Patient tolerated treatment well;No increased pain    Behavior During Therapy WFL for tasks assessed/performed                         Past Medical History:  Diagnosis Date   Arthritis    Complication of anesthesia    nausea   Dyspnea    with exertion   Family history of adverse reaction to anesthesia    PONV- MOM   Hypertension    Hypothyroidism    PONV (postoperative nausea and vomiting)    Sleep apnea    uses CPAP nightly   Stress incontinence    Valvular insufficiency, mitral    Past Surgical History:  Procedure Laterality Date   ANKLE ARTHROSCOPY WITH ARTHRODESIS Left 12/05/2021   Procedure: REPAIR OF TIBIAL NONUNION WITH AUTOGRAFT, TIBIOTALAR ARTHRODESIS;  Surgeon: Terance Hart, MD;  Location: [facility];  Service: Orthopedics;  Laterality: Left;   APPLICATION OF WOUND VAC Right 01/04/2021   Procedure: APPLICATION OF WOUND VAC;  Surgeon: Myrene Galas, MD;  Location: [facility];  Service: Orthopedics;  Laterality: Right;   BREAST CYST ASPIRATION Bilateral    neg   BREAST EXCISIONAL BIOPSY Right    1970's   CATARACT EXTRACTION W/ INTRAOCULAR LENS  IMPLANT, BILATERAL Bilateral    COLONOSCOPY     DILATION AND CURETTAGE OF UTERUS     HARDWARE REMOVAL Left 12/05/2021   Procedure: LEFT ANKLE DEEP HARDWARE REMOVAL, MEDIAL AND LATERAL;  Surgeon: Terance Hart, MD;  Location: [facility];  Service: Orthopedics;  Laterality: Left;  LENGTH OF  SURGERY: 150 MINUTES   I & D EXTREMITY Left 01/04/2021   Procedure: IRRIGATION AND DEBRIDEMENT LEFT ANKLE;  Surgeon: Myrene Galas, MD;  Location: [facility];  Service: Orthopedics;  Laterality: Left;   I & D EXTREMITY Left 01/07/2021   Procedure: IRRIGATION AND DEBRIDEMENT EXTREMITY, Left ankle;  Surgeon: Myrene Galas, MD;  Location: [facility] OR;  Service: Orthopedics;  Laterality: Left;   KNEE ARTHROPLASTY Left 11/24/2016   Procedure: COMPUTER ASSISTED TOTAL KNEE ARTHROPLASTY;  Surgeon: Donato Heinz, MD;  Location: [facility];  Service: Orthopedics;  Laterality: Left;   KNEE ARTHROPLASTY Right 12/31/2020   Procedure: COMPUTER ASSISTED TOTAL KNEE ARTHROPLASTY;  Surgeon: Donato Heinz, MD;  Location: [facility];  Service: Orthopedics;  Laterality: Right;   KNEE ARTHROSCOPY Left    KNEE ARTHROSCOPY Left 07/18/2015   Procedure: ARTHROSCOPY KNEE WITH MEDIAL COMPARTMENTAL MENICUS CHONDROPLASTY;  Surgeon: Donato Heinz, MD;  Location: [facility];  Service: Orthopedics;  Laterality: Left;   KNEE ARTHROSCOPY WITH LATERAL MENISECTOMY Left 07/18/2015   Procedure: LEFT KNEE ARTHROSCOPY WITH PARTIAL LATERAL MENISECTOMY GRADE 4 CHONDROMYLASIA LATERAL TIBIAL PLATEAU;  Surgeon: Donato Heinz, MD;  Location: [facility];  Service: Orthopedics;  Laterality: Left;   ORIF ANKLE FRACTURE Left 01/04/2021  Procedure: OPEN REDUCTION INTERNAL FIXATION (ORIF) ANKLE FRACTURE;  Surgeon: Myrene Galas, MD;  Location: [facility];  Service: Orthopedics;  Laterality: Left;   ORIF ANKLE FRACTURE Left 01/07/2021   Procedure: SCREW REMOVAL ANKLE REMOVAL OF WOUND VAC LEFT LEG;  Surgeon: Myrene Galas, MD;  Location: [facility];  Service: Orthopedics;  Laterality: Left;   Patient Active Problem List   Diagnosis Date Noted   Arthritis of left ankle 12/05/2021   Sleep disturbance    Acute blood loss anemia 01/18/2021   Hypokalemia 01/18/2021   Constipation 01/18/2021   Trauma 01/08/2021   Open left ankle fracture, sequela 01/03/2021   Essential  hypertension 01/03/2021   Hypothyroidism 01/03/2021   Total knee replacement status 12/31/2020   Family history of heart disease 04/12/2018   History of total knee arthroplasty, left 11/24/2016   Adult idiopathic generalized osteoporosis 03/01/2015   OSA on CPAP 01/30/2014    PCP: Bethann Punches, MD  REFERRING PROVIDER: Terance Hart, MD  REFERRING DIAG: Left tibiotalar arthrodesis with repair of tibial nonunion and hardware removal, 12/06/21  THERAPY DIAG:  Abnormality of gait and mobility  Difficulty in walking, not elsewhere classified  Stiffness of left ankle, not elsewhere classified  Pain in left ankle and joints of left foot  Muscle weakness (generalized)  Rationale for Evaluation and Treatment: Rehabilitation  ONSET DATE: 12/06/21   SUBJECTIVE:   SUBJECTIVE STATEMENT: Patient reports doing well overall. Reports has returned to most activities on her own but has to modify some.    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 PMH includes HTN, valvular insufficiency, SUI.    PAIN:  Are you having pain? Yes: NPRS scale: 1/10 Pain location: more in the ankle Pain description: soreness Aggravating factors: Being on the knee scooter is more problematic on the surgically fixed knee.  PRECAUTIONS: Fall  WEIGHT BEARING RESTRICTIONS: Other: Now Weight bearing as tolerated  no Boot or brace requeired  FALLS:  Has patient fallen in last 6 months? No  LIVING ENVIRONMENT: Lives with: lives with their spouse Lives in: House/apartment Stairs: Yes: Internal: 15 steps; on left going up and External: 4 steps; bilateral but cannot reach both Has following equipment at home: Single point cane, Walker - 2 wheeled, Environmental consultant - 4 wheeled, Crutches, Tour manager, bed side commode, and Knee  Scooter  OCCUPATION: Retired  PLOF: Independent  PATIENT GOALS: Pt wants to be able to go up/down the flight of steps.  NEXT MD VISIT:   OBJECTIVE:   DIAGNOSTIC FINDINGS:   CLINICAL DATA:  Fluoroscopic assistance for revision of internal fixation in left ankle   EXAM: LEFT ANKLE COMPLETE - 3+ VIEW   COMPARISON:  CT done on 09/24/2021   FINDINGS: There is interval placement of a new longer screw transfixing the medial malleolus. There is a new metallic plate along the anterior margin of distal tibia anchored to tibia and talus. Fluoroscopic time 1 minute and 11 seconds. Radiation dose 1.46 mGy.   IMPRESSION: Fluoroscopic assistance was provided for revision of internal fixation in left ankle.  PATIENT SURVEYS:  FOTO 40/50  COGNITION: Overall cognitive status: Within functional limits for tasks assessed     SENSATION: WFL  PALPATION: No tenderness surrounding the ankle  LOWER EXTREMITY ROM:  Active ROM Right eval Left eval  Ankle dorsiflexion  2 deg  Ankle plantarflexion  13 deg  Ankle inversion  7 deg  Ankle eversion  3 deg   (Blank rows = not tested)   LOWER EXTREMITY SPECIAL TESTS: at eavl  Ankle special tests: Not performed at this time   FUNCTIONAL TESTS:   5 times sit to stand: 19.41 sec Timed up and go (TUG): 18.25 sec 10 meter walk test: 18.54 sec Dynamic Gait Index: Not performed due to weightbearing restrictions.   GAIT: At EVAL: Distance walked: Not performed due to weightbearing restrictions.  TODAY'S TREATMENT: DATE: 07/02/22   THEREX:   walk = 1240 feet without AD SLS time = 7 sec on left at best Seated ankle DF/PF/IV/EV with RTB x 15 reps each Step tap to focus on SLS x 20 reps each LE Calf raises- toes wide, normal, and toes in- 2 sets of 7/7/7        PATIENT EDUCATION:  Education details: Pt educated throughout session about proper posture and technique with exercises. Improved exercise technique, movement  at target joints, use of target muscles after min to mod verbal, visual, tactile cues.    Person educated: Patient and Spouse Education method: Explanation Education comprehension: verbalized understanding  HOME EXERCISE PROGRAM:  Access Code: [email or web address] URL: [email or web address] Date: 01/27/2022 Prepared by: Tomasa Hose  Exercises - Towel Scrunches  - 1 x daily - 7 x weekly - 3 sets - 10 reps - Seated Heel Raise  - 1 x daily - 7 x weekly - 3 sets - 10 reps - Seated Long Arc Quad  - 1 x daily - 7 x weekly - 3 sets - 10 reps - Ankle Inversion Eversion Towel Slide  - 1 x daily - 7 x weekly - 3 sets - 10 reps - Arch Lifting  - 1 x daily - 7 x weekly - 3 sets - 10 reps   ASSESSMENT:  CLINICAL IMPRESSION:  Patient presents with good motivation and no pain reported with any therex or walking. She was able to continue to demo progress with improved overall gait distance/edurance with 6 min walk test- much improved overall. She required just minimal small VC for exercise technique with balance and ankle strengthening today. She has one more scheduled visit to practice her final HEP and then plan to discharge next visit as appropriate.    Unless otherwise stated, CGA was provided and gait belt donned in order to ensure pt safety    OBJECTIVE IMPAIRMENTS: Abnormal gait, decreased balance, decreased mobility, difficulty walking, decreased ROM, decreased strength, hypomobility, impaired flexibility, and pain.   ACTIVITY LIMITATIONS: standing, squatting, stairs, transfers, bed mobility, bathing, dressing, and locomotion level  PARTICIPATION LIMITATIONS: meal prep, cleaning, laundry, driving, shopping, community activity, yard work, and church  PERSONAL FACTORS: Age, Past/current experiences, Time since onset of injury/illness/exacerbation, and 3+ comorbidities: arthritis, HTN, generalized osteoporosis  are also affecting patient's functional outcome.   REHAB POTENTIAL:  Good  CLINICAL DECISION MAKING: Stable/uncomplicated  EVALUATION COMPLEXITY: Moderate   GOALS: Goals reviewed with patient? Yes  SHORT TERM GOALS: Target date: 02/24/2022    Pt will be independent with HEP in order to demonstrate increased ability to perform tasks related to occupation/hobbies. Baseline: Pt given HEP at initial evaluation; 03/05/2022- Patient verbalized compliance with HEP and able to return demonstration of ankle ROM and LE strenghthening. Review general LE strengthening with patient and husband. Goal status: MET   LONG TERM GOALS: Target date: 07/14/2022  1.  Patient (> 79 years old) will complete five times sit to stand test in <  15 seconds indicating an increased LE strength and improved balance. Baseline: Not performed due to weightbearing restrictions. 03/05/2022- Deferred - waiting to discuss any restrictions with MD 2/12: 16.2 sec; 04/21/2022= 11 sec Goal status: MET  2.  Patient will increase FOTO score to equal to or greater than  50   to demonstrate statistically significant improvement in mobility and quality of life.  Baseline: FOTO: 40 2/12: 47; 04/21/2022= 50 Goal status: MET   3.  Patient will increase Berg Balance score by > 6 points to demonstrate decreased fall risk during functional activities. Baseline: Not performed due to time constraints. 2/12: 48/56; did not assess due to time constraints 06/10/21: 53 Goal status: MET    4.  Patient will reduce timed up and go to <11 seconds to reduce fall risk and demonstrate improved transfer/gait ability. Baseline: 11.39 sec with walker 2/12:16.1 sec with SPC; 04/21/2022= 11.69 sec with use of SPC 4/10: 10.6 sec no AD Goal status: Met  5.  Patient will increase 10 meter walk test to >1.60m/s as to improve gait speed for better community ambulation and to reduce fall risk. Baseline: 18.59 sec - 0.54 m/s with walker 2/12: .52 m/s with SPC; 04/21/2022= 0.58 m/s with increased VC to take longer steps. 4/10:.82  m/s Goal status: Progressing   6.  Patient will increase six minute walk test distance to >1000 for progression to community ambulator and improve gait ability Baseline: Not performed at this time and will perform at later date. 2/12: 500 feet with no brace and with SPC some soreness in the  ankle; 04/21/2022= 535 feet with use of SPC 4/10: 805 ft without AD; 07/02/2022= 1240 feet without a AD Goal status: MET  7. Patient will report ability to return to performing more functional activities including cleaning tub/toilet and vacuuming without LOB or significant difficulty.  Baseline: 04/21/2022-Patient reports she has significant difficulty with cleaning toilet and tub including vacuuming. 4/10: able to do but has to make modifications due to balance aspect of tasks. 07/02/2022- Patient reports she has returned to functional tasks - vacuuming and able to clean tub.  Goal status: MET   PLAN:  PT FREQUENCY: 1-2x/week  PT DURATION: 12 weeks  PLANNED INTERVENTIONS: Therapeutic exercises, Therapeutic activity, Neuromuscular re-education, Balance training, Gait training, Patient/Family education, Self Care, Joint mobilization, Stair training, DME instructions, Aquatic Therapy, Dry Needling, Electrical stimulation, Cryotherapy, Moist heat, scar mobilization, Ultrasound, Parrafin, Fluidotherapy, and Manual therapy  PLAN FOR NEXT SESSION:   Reassess all remaining goals. Finalize HEP and plan to discharge to home exercise program next visit.    Lenda Kelp PT  Physical Therapist-   [facility]   3:06 PM 07/02/22

## 2022-07-07 ENCOUNTER — Ambulatory Visit: Payer: Medicare Other

## 2022-07-09 ENCOUNTER — Encounter: Payer: Self-pay | Admitting: Physical Therapy

## 2022-07-09 ENCOUNTER — Ambulatory Visit: Payer: Medicare Other | Admitting: Physical Therapy

## 2022-07-09 DIAGNOSIS — R269 Unspecified abnormalities of gait and mobility: Secondary | ICD-10-CM

## 2022-07-09 DIAGNOSIS — R262 Difficulty in walking, not elsewhere classified: Secondary | ICD-10-CM

## 2022-07-09 DIAGNOSIS — M25672 Stiffness of left ankle, not elsewhere classified: Secondary | ICD-10-CM

## 2022-07-09 DIAGNOSIS — M25572 Pain in left ankle and joints of left foot: Secondary | ICD-10-CM

## 2022-07-09 NOTE — Therapy (Signed)
OUTPATIENT PHYSICAL THERAPY LOWER EXTREMITY TREATMENT/ DISCHARGE THERAPY    Patient Name: MARVEEN HANKES MRN: [id number] DOB:06/24/1940, 82 y.o., female Today's Date: 07/09/2022  END OF SESSION:   PT End of Session - 07/09/22 1302     Visit Number 34    Number of Visits 47    Date for PT Re-Evaluation 07/14/22    Authorization Type Medicare A&B Primary    Progress Note Due on Visit 40    PT Start Time 1302    PT Stop Time 1344    PT Time Calculation (min) 42 min    Equipment Utilized During Treatment Gait belt    Activity Tolerance Patient tolerated treatment well;No increased pain    Behavior During Therapy WFL for tasks assessed/performed                          Past Medical History:  Diagnosis Date   Arthritis    Complication of anesthesia    nausea   Dyspnea    with exertion   Family history of adverse reaction to anesthesia    PONV- MOM   Hypertension    Hypothyroidism    PONV (postoperative nausea and vomiting)    Sleep apnea    uses CPAP nightly   Stress incontinence    Valvular insufficiency, mitral    Past Surgical History:  Procedure Laterality Date   ANKLE ARTHROSCOPY WITH ARTHRODESIS Left 12/05/2021   Procedure: REPAIR OF TIBIAL NONUNION WITH AUTOGRAFT, TIBIOTALAR ARTHRODESIS;  Surgeon: Terance Hart, MD;  Location: [facility] OR;  Service: Orthopedics;  Laterality: Left;   APPLICATION OF WOUND VAC Right 01/04/2021   Procedure: APPLICATION OF WOUND VAC;  Surgeon: Myrene Galas, MD;  Location: [facility] OR;  Service: Orthopedics;  Laterality: Right;   BREAST CYST ASPIRATION Bilateral    neg   BREAST EXCISIONAL BIOPSY Right    1970's   CATARACT EXTRACTION W/ INTRAOCULAR LENS  IMPLANT, BILATERAL Bilateral    COLONOSCOPY     DILATION AND CURETTAGE OF UTERUS     HARDWARE REMOVAL Left 12/05/2021   Procedure: LEFT ANKLE DEEP HARDWARE REMOVAL, MEDIAL AND LATERAL;  Surgeon: Terance Hart, MD;  Location: [facility] OR;  Service: Orthopedics;   Laterality: Left;  LENGTH OF SURGERY: 150 MINUTES   I & D EXTREMITY Left 01/04/2021   Procedure: IRRIGATION AND DEBRIDEMENT LEFT ANKLE;  Surgeon: Myrene Galas, MD;  Location: [facility] OR;  Service: Orthopedics;  Laterality: Left;   I & D EXTREMITY Left 01/07/2021   Procedure: IRRIGATION AND DEBRIDEMENT EXTREMITY, Left ankle;  Surgeon: Myrene Galas, MD;  Location: [facility] OR;  Service: Orthopedics;  Laterality: Left;   KNEE ARTHROPLASTY Left 11/24/2016   Procedure: COMPUTER ASSISTED TOTAL KNEE ARTHROPLASTY;  Surgeon: Donato Heinz, MD;  Location: [facility] ORS;  Service: Orthopedics;  Laterality: Left;   KNEE ARTHROPLASTY Right 12/31/2020   Procedure: COMPUTER ASSISTED TOTAL KNEE ARTHROPLASTY;  Surgeon: Donato Heinz, MD;  Location: [facility] OR[facility];  Service: Orthopedics;  Laterality: Right;   KNEE ARTHROSCOPY Left    KNEE ARTHROSCOPY Left 07/18/2015   Procedure: ARTHROSCOPY KNEE WITH MEDIAL COMPARTMENTAL MENICUS CHONDROPLASTY;  Surgeon: Donato Heinz, MD;  Location: [facility] OR[facility];  Service: Orthopedics;  Laterality: Left;   KNEE ARTHROSCOPY WITH LATERAL MENISECTOMY Left 07/18/2015   Procedure: LEFT KNEE ARTHROSCOPY WITH PARTIAL LATERAL MENISECTOMY GRADE 4 CHONDROMYLASIA LATERAL TIBIAL PLATEAU;  Surgeon: Donato Heinz, MD;  Location: [facility] ORS;  Service: Orthopedics;  Laterality: Left;   ORIF ANKLE FRACTURE  Left 01/04/2021   Procedure: OPEN REDUCTION INTERNAL FIXATION (ORIF) ANKLE FRACTURE;  Surgeon: Myrene Galas, MD;  Location: [facility] OR;  Service: Orthopedics;  Laterality: Left;   ORIF ANKLE FRACTURE Left 01/07/2021   Procedure: SCREW REMOVAL ANKLE REMOVAL OF WOUND VAC LEFT LEG;  Surgeon: Myrene Galas, MD;  Location: [facility] OR;  Service: Orthopedics;  Laterality: Left;   Patient Active Problem List   Diagnosis Date Noted   Arthritis of left ankle 12/05/2021   Sleep disturbance    Acute blood loss anemia 01/18/2021   Hypokalemia 01/18/2021   Constipation 01/18/2021   Trauma 01/08/2021   Open left ankle fracture,  sequela 01/03/2021   Essential hypertension 01/03/2021   Hypothyroidism 01/03/2021   Total knee replacement status 12/31/2020   Family history of heart disease 04/12/2018   History of total knee arthroplasty, left 11/24/2016   Adult idiopathic generalized osteoporosis 03/01/2015   OSA on CPAP 01/30/2014    PCP: Bethann Punches, MD  REFERRING PROVIDER: Terance Hart, MD  REFERRING DIAG: Left tibiotalar arthrodesis with repair of tibial nonunion and hardware removal, 12/06/21  THERAPY DIAG:  Abnormality of gait and mobility  Difficulty in walking, not elsewhere classified  Stiffness of left ankle, not elsewhere classified  Pain in left ankle and joints of left foot  Rationale for Evaluation and Treatment: Rehabilitation  ONSET DATE: 12/06/21   SUBJECTIVE:   SUBJECTIVE STATEMENT: Patient reports doing well overall. Reports has returned to most activities on her own and still has difficulty with single leg balance.  Patient verbalized understanding she needs to work on single-leg balance and will be given home exercise program to continue to improve this.  Very happy with physical therapy treatment she has had thus far.   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 PMH includes HTN, valvular insufficiency, SUI.    PAIN:  Are you having pain? Yes: NPRS scale: 1/10 Pain location: more in the ankle Pain description: soreness Aggravating factors: Being on the knee scooter is more problematic on the surgically fixed knee.  PRECAUTIONS: Fall  WEIGHT BEARING RESTRICTIONS: Other: Now Weight bearing as tolerated  no Boot or brace requeired  FALLS:  Has patient fallen in last 6 months? No  LIVING ENVIRONMENT: Lives with: lives with their spouse Lives in: House/apartment Stairs: Yes: Internal: 15  steps; on left going up and External: 4 steps; bilateral but cannot reach both Has following equipment at home: Single point cane, Walker - 2 wheeled, Environmental consultant - 4 wheeled, Crutches, Tour manager, bed side commode, and Knee Scooter  OCCUPATION: Retired  PLOF: Independent  PATIENT GOALS: Pt wants to be able to go up/down the flight of steps.  NEXT MD VISIT:   OBJECTIVE:   DIAGNOSTIC FINDINGS:   CLINICAL DATA:  Fluoroscopic assistance for revision of internal fixation in left ankle   EXAM: LEFT ANKLE COMPLETE - 3+ VIEW   COMPARISON:  CT done on 09/24/2021   FINDINGS: There is interval placement of a new longer screw transfixing the medial malleolus. There is a new metallic plate along the anterior margin of distal tibia anchored to tibia and talus. Fluoroscopic time 1 minute and 11 seconds. Radiation dose 1.46 mGy.   IMPRESSION: Fluoroscopic assistance was provided for revision of internal fixation in left ankle.  PATIENT SURVEYS:  FOTO 40/50  COGNITION: Overall cognitive status: Within functional  limits for tasks assessed     SENSATION: WFL  PALPATION: No tenderness surrounding the ankle  LOWER EXTREMITY ROM:  Active ROM Right eval Left eval  Ankle dorsiflexion  2 deg  Ankle plantarflexion  13 deg  Ankle inversion  7 deg  Ankle eversion  3 deg   (Blank rows = not tested)   LOWER EXTREMITY SPECIAL TESTS: at eavl  Ankle special tests: Not performed at this time   FUNCTIONAL TESTS:   5 times sit to stand: 19.41 sec Timed up and go (TUG): 18.25 sec 10 meter walk test: 18.54 sec Dynamic Gait Index: Not performed due to weightbearing restrictions.   GAIT: At EVAL: Distance walked: Not performed due to weightbearing restrictions.  TODAY'S TREATMENT: DATE: 07/09/22   THEREX:   Instructed in the following home exercise programs patient can continue to work on single-leg balance and stability following discharge. Exercises - Standing toe taps (may  use steps at home)   - - 7 x weekly - 2 sets - 10 reps - Sideways Step Touch  - 7 x weekly - 3 sets - 10 reps - Standing Heel Raise with Toes Turned In  - 7 x weekly - 1 sets - 10 reps - Standing Heel Raise with Toes Turned Out   - 7 x weekly - 1 sets - 10 reps - Standing Heel Raise  -  7 x weekly - 1 sets - 10 reps - Single Leg Heel Raise with Counter Support  -  - 7 x weekly - 1 sets - 10 reps - Sit to Stand with Arms Crossed  -  7 x weekly - 2 sets - 10 reps - Mini Squat with Counter Support  - 7 x weekly - 2 sets - 10 reps     PATIENT EDUCATION:  Education details: Pt educated throughout session about proper posture and technique with exercises. Improved exercise technique, movement at target joints, use of target muscles after min to mod verbal, visual, tactile cues.    Person educated: Patient and Spouse Education method: Explanation Education comprehension: verbalized understanding  HOME EXERCISE PROGRAM:  Access Code: [email or web address] URL: [email or web address] Date: 07/09/2022 Prepared by: Thresa Ross  Exercises - Standing toe taps (may use steps at home)   - 1 x daily - 7 x weekly - 2 sets - 10 reps - Sideways Step Touch  - 1 x daily - 7 x weekly - 3 sets - 10 reps - Standing Heel Raise with Toes Turned In  - 1 x daily - 7 x weekly - 1 sets - 10 reps - Standing Heel Raise with Toes Turned Out  - 1 x daily - 7 x weekly - 1 sets - 10 reps - Standing Heel Raise  - 1 x daily - 7 x weekly - 1 sets - 10 reps - Single Leg Heel Raise with Counter Support  - 1 x daily - 7 x weekly - 1 sets - 10 reps - Sit to Stand with Arms Crossed  - 1 x daily - 7 x weekly - 2 sets - 10 reps - Mini Squat with Counter Support  - 1 x daily - 7 x weekly - 2 sets - 10 reps   ASSESSMENT:  CLINICAL IMPRESSION: Patient presents to physical therapy for discharge therapy this date.  Patient has met and exceeded all of her physical therapy goals and demonstrates improved ability to  ambulate and as well as for debility to balance and complete her daily activities.  Patient does still have difficulty with single-leg balance as well as with some higher level chores but has been provided with weights to continue to work on her single-leg balance following discharge as well as with options for altering her home environment she can complete these activities safely and effectively.  Patient will be discharged from formal physical therapy at this time with advanced home exercise program.  Patient is happy with this plan and all questions have been answered.    OBJECTIVE IMPAIRMENTS: Abnormal gait, decreased balance, decreased mobility, difficulty walking, decreased ROM, decreased strength, hypomobility, impaired flexibility, and pain.   ACTIVITY LIMITATIONS: standing, squatting, stairs, transfers, bed mobility, bathing, dressing, and locomotion level  PARTICIPATION LIMITATIONS: meal prep, cleaning, laundry, driving, shopping, community activity, yard work, and church  PERSONAL FACTORS: Age, Past/current experiences, Time since onset of injury/illness/exacerbation, and 3+ comorbidities: arthritis, HTN, generalized osteoporosis  are also affecting patient's functional outcome.   REHAB POTENTIAL: Good  CLINICAL DECISION MAKING: Stable/uncomplicated  EVALUATION COMPLEXITY: Moderate   GOALS: Goals reviewed with patient? Yes  SHORT TERM GOALS: Target date: 02/24/2022    Pt will be independent with HEP in order to demonstrate increased ability to perform tasks related to occupation/hobbies. Baseline: Pt given HEP at initial evaluation; 03/05/2022- Patient verbalized compliance with HEP and able to return demonstration of ankle ROM and LE strenghthening. Review general LE strengthening with patient and husband. Goal status: MET   LONG TERM GOALS: Target date: 07/14/2022  1.  Patient (> 55 years old) will complete five times sit to stand test in < 15 seconds indicating an increased  LE strength and improved balance. Baseline: Not performed due to weightbearing restrictions. 03/05/2022- Deferred - waiting to discuss any restrictions with MD 2/12: 16.2 sec; 04/21/2022= 11 sec Goal status: MET  2.  Patient will increase FOTO score to equal to or greater than  50   to demonstrate statistically significant improvement in mobility and quality of life.  Baseline: FOTO: 40 2/12: 47; 04/21/2022= 50 Goal status: MET   3.  Patient will increase Berg Balance score by > 6 points to demonstrate decreased fall risk during functional activities. Baseline: Not performed due to time constraints. 2/12: 48/56; did not assess due to time constraints 06/10/21: 53 Goal status: MET    4.  Patient will reduce timed up and go to <11 seconds to reduce fall risk and demonstrate improved transfer/gait ability. Baseline: 11.39 sec with walker 2/12:16.1 sec with SPC; 04/21/2022= 11.69 sec with use of SPC 4/10: 10.6 sec no AD Goal status: Met  5.  Patient will increase 10 meter walk test to >1.41m/s as to improve gait speed for better community ambulation and to reduce fall risk. Baseline: 18.59 sec - 0.54 m/s with walker 2/12: .52 m/s with SPC; 04/21/2022= 0.58 m/s with increased VC to take longer steps. 4/10:.82 m/s 07/09/22: 9 sec (1.58m/s) Goal Status: MET   6.  Patient will increase six minute walk test distance to >1000 for progression to community ambulator and improve gait ability Baseline: Not performed at this time and will perform at later date. 2/12: 500 feet with no brace and with SPC some soreness in the  ankle; 04/21/2022= 535 feet with use of SPC 4/10: 805 ft without AD; 07/02/2022= 1240 feet without a AD Goal status: MET  7. Patient will report ability to return to performing more functional activities including cleaning tub/toilet and vacuuming without LOB or significant difficulty.  Baseline: 04/21/2022-Patient reports she has significant difficulty with cleaning toilet and  tub including  vacuuming. 4/10: able to do but has to make modifications due to balance aspect of tasks. 07/02/2022- Patient reports she has returned to functional tasks - vacuuming and able to clean tub.  Goal status: MET   PLAN:  PT FREQUENCY: 1-2x/week  PT DURATION: 12 weeks  PLANNED INTERVENTIONS: Therapeutic exercises, Therapeutic activity, Neuromuscular re-education, Balance training, Gait training, Patient/Family education, Self Care, Joint mobilization, Stair training, DME instructions, Aquatic Therapy, Dry Needling, Electrical stimulation, Cryotherapy, Moist heat, scar mobilization, Ultrasound, Parrafin, Fluidotherapy, and Manual therapy  PLAN FOR NEXT SESSION:      Norman Herrlich PT  Physical Therapist- [place]  [facility]   1:06 PM 07/09/22

## 2022-07-14 ENCOUNTER — Ambulatory Visit: Payer: Medicare Other

## 2022-07-16 ENCOUNTER — Ambulatory Visit: Payer: Medicare Other

## 2022-07-21 ENCOUNTER — Ambulatory Visit: Payer: Medicare Other

## 2022-07-23 ENCOUNTER — Ambulatory Visit: Payer: Medicare Other

## 2022-07-30 ENCOUNTER — Ambulatory Visit: Payer: Medicare Other

## 2022-08-04 ENCOUNTER — Ambulatory Visit: Payer: Medicare Other

## 2022-08-06 ENCOUNTER — Ambulatory Visit: Payer: Medicare Other

## 2022-08-11 ENCOUNTER — Ambulatory Visit: Payer: Medicare Other

## 2022-08-13 ENCOUNTER — Ambulatory Visit: Payer: Medicare Other

## 2022-08-18 ENCOUNTER — Ambulatory Visit: Payer: Medicare Other

## 2022-08-20 ENCOUNTER — Ambulatory Visit: Payer: Medicare Other

## 2022-08-25 ENCOUNTER — Ambulatory Visit: Payer: Medicare Other

## 2023-05-08 ENCOUNTER — Other Ambulatory Visit: Payer: Self-pay | Admitting: Internal Medicine

## 2023-05-08 DIAGNOSIS — Z1231 Encounter for screening mammogram for malignant neoplasm of breast: Secondary | ICD-10-CM

## 2023-05-20 ENCOUNTER — Ambulatory Visit
Admission: RE | Admit: 2023-05-20 | Discharge: 2023-05-20 | Disposition: A | Discharge status: Home / Self Care | Source: Ambulatory Visit | Attending: Internal Medicine | Admitting: Internal Medicine

## 2023-05-20 DIAGNOSIS — Z1231 Encounter for screening mammogram for malignant neoplasm of breast: Secondary | ICD-10-CM | POA: Insufficient documentation

## 2023-05-26 ENCOUNTER — Other Ambulatory Visit: Payer: Self-pay | Admitting: Internal Medicine

## 2023-05-26 DIAGNOSIS — R928 Other abnormal and inconclusive findings on diagnostic imaging of breast: Secondary | ICD-10-CM

## 2023-06-08 ENCOUNTER — Ambulatory Visit
Admission: RE | Admit: 2023-06-08 | Discharge: 2023-06-08 | Disposition: A | Discharge status: Home / Self Care | Source: Ambulatory Visit | Attending: Internal Medicine | Admitting: Internal Medicine

## 2023-06-08 DIAGNOSIS — R928 Other abnormal and inconclusive findings on diagnostic imaging of breast: Secondary | ICD-10-CM
